# Patient Record
Sex: Male | Born: 1935 | Hispanic: No | Marital: Married | Smoking: Former smoker
Health system: Southern US, Community
[De-identification: ages and names within clinical notes are randomized; demographics above are authoritative.]

## PROBLEM LIST (undated history)

## (undated) DIAGNOSIS — H9190 Unspecified hearing loss, unspecified ear: Secondary | ICD-10-CM

## (undated) DIAGNOSIS — E119 Type 2 diabetes mellitus without complications: Secondary | ICD-10-CM

## (undated) DIAGNOSIS — K219 Gastro-esophageal reflux disease without esophagitis: Secondary | ICD-10-CM

## (undated) DIAGNOSIS — Z201 Contact with and (suspected) exposure to tuberculosis: Secondary | ICD-10-CM

## (undated) DIAGNOSIS — M1990 Unspecified osteoarthritis, unspecified site: Secondary | ICD-10-CM

## (undated) DIAGNOSIS — E7800 Pure hypercholesterolemia, unspecified: Secondary | ICD-10-CM

## (undated) DIAGNOSIS — I10 Essential (primary) hypertension: Secondary | ICD-10-CM

## (undated) HISTORY — DX: Unspecified hearing loss, unspecified ear: H91.90

## (undated) HISTORY — DX: Contact with and (suspected) exposure to tuberculosis: Z20.1

## (undated) HISTORY — DX: Gastro-esophageal reflux disease without esophagitis: K21.9

## (undated) HISTORY — DX: Pure hypercholesterolemia, unspecified: E78.00

## (undated) HISTORY — DX: Unspecified osteoarthritis, unspecified site: M19.90

## (undated) HISTORY — DX: Essential (primary) hypertension: I10

---

## 1986-08-27 HISTORY — PX: OTHER SURGICAL HISTORY: SHX169

## 2001-09-09 ENCOUNTER — Encounter: Payer: Self-pay | Admitting: Orthopedic Surgery

## 2001-09-09 ENCOUNTER — Encounter: Admission: RE | Admit: 2001-09-09 | Discharge: 2001-09-09 | Payer: Self-pay | Admitting: Orthopedic Surgery

## 2001-09-10 ENCOUNTER — Ambulatory Visit (HOSPITAL_BASED_OUTPATIENT_CLINIC_OR_DEPARTMENT_OTHER): Admission: RE | Admit: 2001-09-10 | Discharge: 2001-09-10 | Payer: Self-pay | Admitting: Orthopedic Surgery

## 2001-10-13 ENCOUNTER — Ambulatory Visit (HOSPITAL_BASED_OUTPATIENT_CLINIC_OR_DEPARTMENT_OTHER): Admission: RE | Admit: 2001-10-13 | Discharge: 2001-10-13 | Payer: Self-pay | Admitting: Orthopedic Surgery

## 2002-01-18 ENCOUNTER — Encounter: Admission: RE | Admit: 2002-01-18 | Discharge: 2002-01-18 | Payer: Self-pay | Admitting: Family Medicine

## 2002-01-18 ENCOUNTER — Encounter: Payer: Self-pay | Admitting: Family Medicine

## 2005-10-29 ENCOUNTER — Encounter: Admission: RE | Admit: 2005-10-29 | Discharge: 2005-10-29 | Payer: Self-pay | Admitting: Family Medicine

## 2006-10-02 ENCOUNTER — Ambulatory Visit: Payer: Self-pay | Admitting: Family Medicine

## 2007-11-25 ENCOUNTER — Ambulatory Visit: Payer: Self-pay | Admitting: Family Medicine

## 2009-01-02 ENCOUNTER — Ambulatory Visit: Payer: Self-pay | Admitting: Family Medicine

## 2010-03-28 ENCOUNTER — Ambulatory Visit: Payer: Self-pay | Admitting: Family Medicine

## 2010-11-20 ENCOUNTER — Ambulatory Visit
Admission: RE | Admit: 2010-11-20 | Discharge: 2010-11-20 | Payer: Self-pay | Source: Home / Self Care | Attending: Family Medicine | Admitting: Family Medicine

## 2011-03-14 NOTE — Op Note (Signed)
[place]  Patient:    PAULO, KEIMIG Visit Number: [id number] MRN: [id number]          Service Type: DSU Location: [facility] Attending Physician:  Ronne Binning Dictated by:   Nicki Reaper, M.D. Proc. Date: 09/10/01 Admit Date:  09/10/2001                             Operative Report  PREOPERATIVE DIAGNOSIS:  Carpal tunnel syndrome right hand.  POSTOPERATIVE DIAGNOSIS:  Carpal tunnel syndrome right hand.  OPERATION/PROCEDURE:  Decompression right median nerve.  SURGEON:  Nicki Reaper, M.D.  ASSISTANT:  ANESTHESIA:  Forearm based IV regional.  ANESTHESIOLOGIST:  Guadalupe Maple, M.D.  INDICATION FOR PROCEDURE:  The patient is a 77 year old male with a  history of carpal tunnel syndrome, EMG nerve conductions positive. He is desirous of having release performed.  DESCRIPTION OF PROCEDURE:  The patient is brought to the operating room where a forearm based IV regional anesthetic was carried out without difficulty in the supine position with right arm free.  He was prepped and draped using DuraPrep.  A straight incision was made longitudinally in the palm and carried down through subcutaneous tissue. Bleeders were electrocauterized.  Palmar fascia was split.  Superficial palmar arch identified.  The flexor tendon of the ring and little finger identified to the ulnar side of the median nerve. Carpal retinaculum was incised with sharp dissection. Right angle and Sewall retractor were placed between the skin and forearm fascia.  The fascia was released for approximately 3 cm proximal to the wrist crease under direct vision.  Canal was explored and no further lesions were identified. The wound was irrigated.  Skin was closed with interrupted 5-0 nylon sutures.  Sterile compressive dressing and splint was applied.  The patient tolerated the procedure well and was taken to the recovery room for observation in satisfactory condition.  He is  discharged home to return to [facility] in one week, on Vicodin and Keflex. Dictated by:   Nicki Reaper, M.D. Attending Physician:  Ronne Binning DD:  09/10/01 TD:  09/10/01 Job: [id number]

## 2011-10-31 ENCOUNTER — Ambulatory Visit (INDEPENDENT_AMBULATORY_CARE_PROVIDER_SITE_OTHER): Payer: Medicare Other | Admitting: Medical

## 2011-10-31 ENCOUNTER — Encounter: Payer: Self-pay | Admitting: Medical

## 2011-10-31 VITALS — BP 128/80 | HR 80 | Temp 97.5°F | Resp 16 | Wt 214.0 lb

## 2011-10-31 DIAGNOSIS — J40 Bronchitis, not specified as acute or chronic: Secondary | ICD-10-CM | POA: Insufficient documentation

## 2011-10-31 MED ORDER — AMOXICILLIN 875 MG PO TABS: 875.0000 mg | ORAL_TABLET | Freq: Two times a day (BID) | ORAL | Status: AC

## 2011-10-31 NOTE — Patient Instructions (Signed)
I am starting you on a 10 day course of Amoxicillin for chest cold today.   Rest, drink plenty of fluids, consider taking Mucinex DM OTC for cough and congestion.   If not improving or worse, call or return.

## 2011-10-31 NOTE — Progress Notes (Signed)
Subjective:   John Barr is a 78 y.o. male who presents for cold and cough.   Been having symptoms since Christmas.  Denies fever.  He notes cough, green mucous productive, but no SOB or dyspnea.  No head congestion, sore throat, ear pain.  Mostly in chest.  Using Vicks cold medication.  Denies sick contacts.  No other aggravating or relieving factors.  No other c/o.  He notes hx/o vocal cord injury, no recent voice changes.   The following portions of the patient's history were reviewed and updated as appropriate: allergies, current medications, past family history, past medical history, past social history, past surgical history and problem list.  Past Medical History  Diagnosis Date  . Hypertension   . High frequency hearing loss   . Exposure to TB     hx  . Hypercholesterolemia   . GERD (gastroesophageal reflux disease)   . Arthritis    ROS: Gen: no fever, chills, sweats, fatigue GI: no belly pain,  N/V/D Lungs: no SOB, wheezing Cardiac: no CP, palpitations  Objective:   Filed Vitals:   10/31/11 1026  BP: 128/80  Pulse: 80  Temp: 97.5 F (36.4 C)  Resp: 16    General appearance: Alert, WD/WN, no distress                             Skin: warm, no rash, no diaphoresis                           Head: no sinus tenderness                            Eyes: conjunctiva normal, corneas clear, PERRLA                            Ears: pearly TMs, external ear canals normal                          Nose: septum midline, turbinates swollen, with erythema and clear discharge             Mouth/throat: MMM, tongue normal, mild pharyngeal erythema                           Neck: supple, no adenopathy, no thyromegaly, nontender                          Heart: RRR, normal S1, S2, no murmurs                         Lungs: +bronchial breath sounds, +scattered rhonchi, no wheezes, no rales                Extremities: no edema, nontender     Assessment and Plan:   Encounter  Diagnosis  Name Primary?  . Bronchitis Yes   Prescription given today for Amoxicillin as below.  Discussed diagnosis and treatment of bronchitis.  Suggested symptomatic OTC remedies for cough and congestion.  Nasal saline spray for nasal congestion.  Tylenol or Ibuprofen OTC for fever and malaise.  Call/return in 2-3 days if symptoms are worse or not improving.  Advised that cough may linger even after the infection is  improved.

## 2012-08-20 ENCOUNTER — Encounter: Payer: Self-pay | Admitting: Internal Medicine

## 2013-07-04 ENCOUNTER — Encounter: Payer: Self-pay | Admitting: Family Medicine

## 2013-07-04 ENCOUNTER — Ambulatory Visit (INDEPENDENT_AMBULATORY_CARE_PROVIDER_SITE_OTHER): Payer: Medicare Other | Admitting: Family Medicine

## 2013-07-04 VITALS — BP 140/90 | HR 80 | Ht 68.5 in | Wt 204.0 lb

## 2013-07-04 DIAGNOSIS — E119 Type 2 diabetes mellitus without complications: Secondary | ICD-10-CM

## 2013-07-04 DIAGNOSIS — E1159 Type 2 diabetes mellitus with other circulatory complications: Secondary | ICD-10-CM

## 2013-07-04 DIAGNOSIS — E785 Hyperlipidemia, unspecified: Secondary | ICD-10-CM

## 2013-07-04 DIAGNOSIS — M129 Arthropathy, unspecified: Secondary | ICD-10-CM

## 2013-07-04 DIAGNOSIS — E1169 Type 2 diabetes mellitus with other specified complication: Secondary | ICD-10-CM

## 2013-07-04 DIAGNOSIS — M1990 Unspecified osteoarthritis, unspecified site: Secondary | ICD-10-CM

## 2013-07-04 DIAGNOSIS — Z79899 Other long term (current) drug therapy: Secondary | ICD-10-CM

## 2013-07-04 DIAGNOSIS — I10 Essential (primary) hypertension: Secondary | ICD-10-CM

## 2013-07-04 LAB — CBC WITH DIFFERENTIAL/PLATELET
Basophils Absolute: 0 10*3/uL (ref 0.0–0.1)
Basophils Relative: 1 % (ref 0–1)
Eosinophils Absolute: 0.2 10*3/uL (ref 0.0–0.7)
Eosinophils Relative: 4 % (ref 0–5)
HCT: 30.7 % — ABNORMAL LOW (ref 39.0–52.0)
Lymphocytes Relative: 25 % (ref 12–46)
MCH: 31.7 pg (ref 26.0–34.0)
MCHC: 34.9 g/dL (ref 30.0–36.0)
MCV: 90.8 fL (ref 78.0–100.0)
Monocytes Absolute: 0.6 10*3/uL (ref 0.1–1.0)
Platelets: 211 10*3/uL (ref 150–400)
RDW: 14.1 % (ref 11.5–15.5)

## 2013-07-04 NOTE — Progress Notes (Signed)
  Subjective:    Patient ID: John Barr, male    DOB: 1926-11-12, 79 y.o.   MRN: [id number]  HPI He is here for an interval evaluation. He gets his care mainly through the [place]. He does have underlying hypertension, hyperlipidemia, diabetes as well as arthritis. 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 He also states that the [place] told him that he had an enlarged heart. He has not complained of chest pain, shortness of breath, diaphoresis or PND the Social history was reviewed. He is married does not smoke or drink. He does check his blood sugars regularly. Exercise is minimal. He gets regular eye checks and has had immunization update. He states he did get his pneumonia shot. He is not sure whether he had shingles.   Review of Systems  Constitutional: Negative.   HENT: Negative.   Eyes: Negative.   Respiratory: Negative.   Cardiovascular: Negative.   Gastrointestinal: Negative.   Endocrine: Negative.   Genitourinary: Negative.        Objective:   Physical Exam alert and in no distress. Tympanic membranes not seen due to hearing aids. Throat is clear. Tonsils are normal. Neck is supple without adenopathy or thyromegaly. Cardiac exam shows a regular sinus rhythm without murmurs or gallops. Lungs are clear to auscultation. Donald exam shows no hepatosplenomegaly with normal bowel sounds.  Hemoglobin A1c is 7.1       Assessment & Plan:  Type II or unspecified type diabetes mellitus without mention of complication, not stated as uncontrolled - Plan: CBC with Differential, Comprehensive metabolic panel, Lipid panel, HgB A1c  Hypertension associated with diabetes - Plan: CBC with Differential, Comprehensive metabolic panel  Hyperlipidemia LDL goal <70 - Plan: Lipid panel  Arthritis  Encounter for long-term (current) use of other medications - Plan: CBC with Differential, Comprehensive metabolic panel, Lipid panel  I encouraged him to  continue with his present lifestyle. Overall I think he is doing quite well. Over 35 minutes spent discussing all these issues with him.

## 2013-07-04 NOTE — Patient Instructions (Addendum)
Use the Prilosec as needed for indigestion or acid reflux. [facility]

## 2013-07-05 LAB — COMPREHENSIVE METABOLIC PANEL
AST: 25 U/L (ref 0–37)
BUN: 25 mg/dL — ABNORMAL HIGH (ref 6–23)
Calcium: 9.8 mg/dL (ref 8.4–10.5)
Chloride: 101 mEq/L (ref 96–112)
Creat: 1.84 mg/dL — ABNORMAL HIGH (ref 0.50–1.35)
Total Bilirubin: 0.3 mg/dL (ref 0.3–1.2)

## 2013-07-05 LAB — LIPID PANEL
HDL: 37 mg/dL — ABNORMAL LOW (ref >39)
Total CHOL/HDL Ratio: 5 Ratio
VLDL: 65 mg/dL — ABNORMAL HIGH (ref 0–40)

## 2013-08-22 LAB — HM DIABETES EYE EXAM: HM Diabetic Eye Exam: NOT DETECTED

## 2013-08-24 ENCOUNTER — Encounter: Payer: Self-pay | Admitting: Internal Medicine

## 2013-09-06 ENCOUNTER — Encounter: Payer: Self-pay | Admitting: Family Medicine

## 2014-04-18 ENCOUNTER — Telehealth: Payer: Self-pay | Admitting: Family Medicine

## 2014-04-18 NOTE — Telephone Encounter (Signed)
lm

## 2014-04-26 ENCOUNTER — Encounter: Payer: Self-pay | Admitting: Family Medicine

## 2014-05-08 ENCOUNTER — Encounter: Payer: Medicare Other | Admitting: Family Medicine

## 2014-05-17 ENCOUNTER — Encounter: Payer: Self-pay | Admitting: Medical

## 2014-05-17 ENCOUNTER — Ambulatory Visit (INDEPENDENT_AMBULATORY_CARE_PROVIDER_SITE_OTHER): Payer: Medicare Other | Admitting: Medical

## 2014-05-17 VITALS — BP 170/100 | HR 92 | Temp 97.7°F | Resp 14 | Wt 204.0 lb

## 2014-05-17 DIAGNOSIS — R6889 Other general symptoms and signs: Secondary | ICD-10-CM

## 2014-05-17 DIAGNOSIS — M545 Low back pain, unspecified: Secondary | ICD-10-CM

## 2014-05-17 DIAGNOSIS — M25551 Pain in right hip: Secondary | ICD-10-CM

## 2014-05-17 DIAGNOSIS — M25559 Pain in unspecified hip: Secondary | ICD-10-CM

## 2014-05-17 DIAGNOSIS — I10 Essential (primary) hypertension: Secondary | ICD-10-CM

## 2014-05-17 DIAGNOSIS — E119 Type 2 diabetes mellitus without complications: Secondary | ICD-10-CM

## 2014-05-17 LAB — COMPREHENSIVE METABOLIC PANEL
ALBUMIN: 4.3 g/dL (ref 3.5–5.2)
ALK PHOS: 47 U/L (ref 39–117)
ALT: 22 U/L (ref 0–53)
AST: 23 U/L (ref 0–37)
BILIRUBIN TOTAL: 0.4 mg/dL (ref 0.2–1.2)
BUN: 24 mg/dL — ABNORMAL HIGH (ref 6–23)
CO2: 27 mEq/L (ref 19–32)
Calcium: 9.6 mg/dL (ref 8.4–10.5)
Chloride: 100 mEq/L (ref 96–112)
Creat: 1.79 mg/dL — ABNORMAL HIGH (ref 0.50–1.35)
GLUCOSE: 136 mg/dL — AB (ref 70–99)
POTASSIUM: 4.6 meq/L (ref 3.5–5.3)
SODIUM: 136 meq/L (ref 135–145)
TOTAL PROTEIN: 7.1 g/dL (ref 6.0–8.3)

## 2014-05-17 LAB — POCT URINALYSIS DIPSTICK
Bilirubin, UA: NEGATIVE
GLUCOSE UA: NEGATIVE
Ketones, UA: NEGATIVE
LEUKOCYTES UA: NEGATIVE
NITRITE UA: NEGATIVE
SPEC GRAV UA: 1.02
UROBILINOGEN UA: NEGATIVE
pH, UA: 5

## 2014-05-17 LAB — CBC WITH DIFFERENTIAL/PLATELET
BASOS PCT: 0 % (ref 0–1)
Basophils Absolute: 0 10*3/uL (ref 0.0–0.1)
EOS ABS: 0.1 10*3/uL (ref 0.0–0.7)
EOS PCT: 2 % (ref 0–5)
HCT: 33.4 % — ABNORMAL LOW (ref 39.0–52.0)
Hemoglobin: 11.1 g/dL — ABNORMAL LOW (ref 13.0–17.0)
Lymphocytes Relative: 22 % (ref 12–46)
Lymphs Abs: 1.5 10*3/uL (ref 0.7–4.0)
MCH: 30 pg (ref 26.0–34.0)
MCHC: 33.2 g/dL (ref 30.0–36.0)
MCV: 90.3 fL (ref 78.0–100.0)
Monocytes Absolute: 0.8 10*3/uL (ref 0.1–1.0)
Monocytes Relative: 11 % (ref 3–12)
NEUTROS PCT: 65 % (ref 43–77)
Neutro Abs: 4.5 10*3/uL (ref 1.7–7.7)
PLATELETS: 242 10*3/uL (ref 150–400)
RBC: 3.7 MIL/uL — ABNORMAL LOW (ref 4.22–5.81)
RDW: 13.9 % (ref 11.5–15.5)
WBC: 6.9 10*3/uL (ref 4.0–10.5)

## 2014-05-17 MED ORDER — SIMVASTATIN 40 MG PO TABS: 40.0000 mg | ORAL_TABLET | Freq: Every evening | ORAL | Status: DC

## 2014-05-17 MED ORDER — HYDROCHLOROTHIAZIDE 25 MG PO TABS: 25.0000 mg | ORAL_TABLET | Freq: Every day | ORAL | Status: DC

## 2014-05-17 MED ORDER — METFORMIN HCL 500 MG PO TABS: 500.0000 mg | ORAL_TABLET | Freq: Two times a day (BID) | ORAL | Status: DC

## 2014-05-17 MED ORDER — BENAZEPRIL HCL 40 MG PO TABS: 40.0000 mg | ORAL_TABLET | Freq: Every day | ORAL | Status: DC

## 2014-05-17 MED ORDER — OMEPRAZOLE 20 MG PO CPDR: 20.0000 mg | DELAYED_RELEASE_CAPSULE | Freq: Every day | ORAL | Status: DC

## 2014-05-17 NOTE — Progress Notes (Signed)
Subjective: Here with daughter John Barr.  His routine care is normally done at the [facility], however he has been to a variety of clinics in the last few months. For unknown reason he is out of most of his medications, although he says he has routine follow up with the [facility] for diabetes and blood pressure  He is here today for pain in right side x Friday.  Hasn't been able to get out of bed due to pain.  transferring out of chair or bed really hurts.  Uses cane some in general, but having to use cane more with this pain.  Walking aggravates it some but not as bad as transferring.  [place] out of bed onto his bottom this past weekend, but was having pain in the right side prior to this.  Denies fever, no blood in urine or stool.  Has some low back pain on the right. He notes hx/o hip pain in the past.   Last xray - unsure?  denies pain down the rest of the right leg.  No issues with the left leg.  Denies incontinence to stool or urine.  Denies belly pain, no NVD.   No respiratory symptoms, no arm symptoms, no numbness tingling or weakness.  No urinary odor or cloudiness to urine.   Doesn't recall prostate infection. No other aggravating or relieving factors.  No other complaint.  ROS as in subjective  Objective   Filed Vitals:   05/17/14 1346  BP: 170/100  Pulse: 92  Temp: 97.7 F (36.5 C)  Resp: 14    General appearance: alert, no distress, WD/WN, elderly AA male  Heart: RRR, normal S1, S2, no murmurs Lungs: CTA bilaterally, no wheezes, rhonchi, or rales Abdomen: +bs, soft, mild right upper quadrant tenderness, otherwise non tender, non distended, no masses, no hepatomegaly, no splenomegaly Back: tender right paraspinal upper and lower lumbar region Musculoskeletal: bilat hip external ROM reduced, otherwise ROM seems normal bilat LE, legs nontender, no swelling, possibly some mild atrophy on the right compared to the left, no other obvious deformity Extremities: 1+ bilat LE edema, no  cyanosis, no clubbing Pulses: 2+ symmetric, upper and lower extremities, normal cap refill Neurological:  Right leg 4/5 strength compared to left but says this is chromic, otherwise relatively normal sensation.    Assessment: Encounter Diagnoses  Name Primary?  . Right-sided low back pain without sciatica Yes  . Hip pain, right   . Difficulty transferring   . Essential hypertension, benign   . Type II or unspecified type diabetes mellitus without mention of complication, not stated as uncontrolled    Plan: Labs today, urine sent for culture, advise heat pad, Tylenol, gentle stretching for the right back/hip/flank pain which appears to be musculoskeletal strain and spasm.  I asked his daughter to help him get his medications figured out so he can continue to get his 54 day supply from the [place] he avoid numerous clinics to help improve continuity of care  All of his medications for local and we'll call back with lab results

## 2014-05-18 ENCOUNTER — Other Ambulatory Visit: Payer: Self-pay | Admitting: Medical

## 2014-05-18 LAB — HEMOGLOBIN A1C
Hgb A1c MFr Bld: 8.2 % — ABNORMAL HIGH (ref <5.7)
MEAN PLASMA GLUCOSE: 189 mg/dL — AB (ref <117)

## 2014-05-19 ENCOUNTER — Other Ambulatory Visit: Payer: Self-pay | Admitting: Medical

## 2014-05-19 LAB — URINE CULTURE

## 2014-05-19 MED ORDER — CIPROFLOXACIN HCL 500 MG PO TABS: 500.0000 mg | ORAL_TABLET | Freq: Two times a day (BID) | ORAL | Status: DC

## 2014-05-23 ENCOUNTER — Telehealth: Payer: Self-pay | Admitting: Family Medicine

## 2014-05-23 NOTE — Telephone Encounter (Signed)
Make f/u appt for a week or so to recheck urine and to discuss med changes, specifically regarding diabetes and kidneys.  We need to try and get last few OV notes and labs from the [place] too.   Please work on this.

## 2014-05-23 NOTE — Telephone Encounter (Signed)
Patient's daughter called back and said that her father wanted our office to handle his medications or any changes that need to take place. CLS Who do you need him to set an appointment up with?

## 2014-05-24 ENCOUNTER — Ambulatory Visit (INDEPENDENT_AMBULATORY_CARE_PROVIDER_SITE_OTHER): Payer: Medicare Other | Admitting: Family Medicine

## 2014-05-24 ENCOUNTER — Encounter: Payer: Self-pay | Admitting: Family Medicine

## 2014-05-24 VITALS — BP 124/80 | HR 80 | Wt 204.0 lb

## 2014-05-24 DIAGNOSIS — M549 Dorsalgia, unspecified: Secondary | ICD-10-CM

## 2014-05-24 DIAGNOSIS — N390 Urinary tract infection, site not specified: Secondary | ICD-10-CM

## 2014-05-24 DIAGNOSIS — M129 Arthropathy, unspecified: Secondary | ICD-10-CM

## 2014-05-24 DIAGNOSIS — E119 Type 2 diabetes mellitus without complications: Secondary | ICD-10-CM

## 2014-05-24 DIAGNOSIS — G8929 Other chronic pain: Secondary | ICD-10-CM

## 2014-05-24 DIAGNOSIS — M1990 Unspecified osteoarthritis, unspecified site: Secondary | ICD-10-CM

## 2014-05-24 MED ORDER — CIPROFLOXACIN HCL 500 MG PO TABS: 500.0000 mg | ORAL_TABLET | Freq: Two times a day (BID) | ORAL | Status: DC

## 2014-05-24 NOTE — Telephone Encounter (Signed)
Patient had an appointment today to follow up with Dr. Redmond School. CLS Patient will need to sign a medical release to get anything. He is already gone. I guess Dr. Redmond School is gone to be helping him with medications and care. CLS

## 2014-05-24 NOTE — Progress Notes (Signed)
   Subjective:    Patient ID: John Barr, male    DOB: Mar 25, 1927, 80 y.o.   MRN: [id number]  HPI He is here for recheck. He states that he is roughly 50 better with his back pain and urinary symptoms. He did face of Cipro and had no difficulty with this. He usually gets his care at the [facility] for his diabetes and general medical care. He would like to switch to my practice. His daughter he is here with him and is helping take care of him and his wife. He has had some difficulty with his ADD else and she is looking for help with this. He continues to complain of back pain and further history indicates he has had difficulty with this for several years.   Review of Systems     Objective:   Physical Exam Alert and in no distress otherwise not examined. Review of his previous urinalysis dipstick was equivocal.       Assessment & Plan:  Chronic back pain - Plan: Ambulatory referral to Physical Therapy  Type II or unspecified type diabetes mellitus without mention of complication, not stated as uncontrolled  Arthritis - Plan: Ambulatory referral to Physical Therapy  UTI (lower urinary tract infection) - Plan: ciprofloxacin (CIPRO) 500 MG tablet  he would like to switch to me for his general medical care which I will do. Discussed falls with him and strongly encourage getting involved in physical therapy for general back rehabilitation program. Encouraged him to get as busy as possible. He will continue to use his 4 pronged walker. He will also get another prescription for Cipro since he is roughly 50% better. Discussed changing his dietary habits to cut back on carbohydrates. His daughter states that he does eat cookies, ice cream and doughnuts. Recheck here in 4 months.

## 2014-05-29 ENCOUNTER — Ambulatory Visit: Payer: Medicare Other | Attending: Family Medicine

## 2014-05-29 DIAGNOSIS — R293 Abnormal posture: Secondary | ICD-10-CM | POA: Diagnosis not present

## 2014-05-29 DIAGNOSIS — M545 Low back pain, unspecified: Secondary | ICD-10-CM | POA: Diagnosis not present

## 2014-05-29 DIAGNOSIS — IMO0001 Reserved for inherently not codable concepts without codable children: Secondary | ICD-10-CM | POA: Diagnosis not present

## 2014-05-29 DIAGNOSIS — M2560 Stiffness of unspecified joint, not elsewhere classified: Secondary | ICD-10-CM | POA: Diagnosis not present

## 2014-05-29 DIAGNOSIS — R262 Difficulty in walking, not elsewhere classified: Secondary | ICD-10-CM | POA: Diagnosis not present

## 2014-05-31 ENCOUNTER — Ambulatory Visit: Payer: Medicare Other | Admitting: Physical Therapy

## 2014-05-31 DIAGNOSIS — IMO0001 Reserved for inherently not codable concepts without codable children: Secondary | ICD-10-CM | POA: Diagnosis not present

## 2014-06-05 ENCOUNTER — Telehealth: Payer: Self-pay | Admitting: Family Medicine

## 2014-06-05 ENCOUNTER — Ambulatory Visit: Payer: Medicare Other | Admitting: Physical Therapy

## 2014-06-05 DIAGNOSIS — IMO0001 Reserved for inherently not codable concepts without codable children: Secondary | ICD-10-CM | POA: Diagnosis not present

## 2014-06-05 NOTE — Telephone Encounter (Signed)
Daughter, Adonis Huguenin, called stating they received letter about Hydrochlorothiazide that pt  rcvd got filled recently. Letter says med was recalled due to a differnt pill found mixed in meds & thy should contact Dr. Hulen Skains pharmacy, [facility] [place], where pt pt picked up his med & pharmacist verified that their store did not dispense any of the recalled meds. Advised the daughter of this

## 2014-06-07 ENCOUNTER — Ambulatory Visit: Payer: Medicare Other | Admitting: Physical Therapy

## 2014-06-07 DIAGNOSIS — IMO0001 Reserved for inherently not codable concepts without codable children: Secondary | ICD-10-CM | POA: Diagnosis not present

## 2014-06-09 ENCOUNTER — Encounter: Payer: Medicare Other | Admitting: Physical Therapy

## 2014-06-12 ENCOUNTER — Encounter: Payer: Medicare Other | Admitting: Physical Therapy

## 2014-06-14 ENCOUNTER — Encounter: Payer: Medicare Other | Admitting: Physical Therapy

## 2014-06-15 ENCOUNTER — Other Ambulatory Visit: Payer: Self-pay | Admitting: Orthopedic Surgery

## 2014-06-15 ENCOUNTER — Ambulatory Visit
Admission: RE | Admit: 2014-06-15 | Discharge: 2014-06-15 | Disposition: A | Discharge status: Home / Self Care | Payer: Medicare Other | Source: Ambulatory Visit | Attending: Orthopedic Surgery | Admitting: Orthopedic Surgery

## 2014-06-15 DIAGNOSIS — L03032 Cellulitis of left toe: Secondary | ICD-10-CM

## 2014-06-16 ENCOUNTER — Encounter: Payer: Medicare Other | Admitting: Physical Therapy

## 2014-08-19 ENCOUNTER — Other Ambulatory Visit: Payer: Self-pay | Admitting: Medical

## 2014-08-24 ENCOUNTER — Encounter: Payer: Self-pay | Admitting: Internal Medicine

## 2014-08-24 LAB — HM DIABETES EYE EXAM

## 2014-08-29 ENCOUNTER — Other Ambulatory Visit: Payer: Self-pay

## 2014-08-29 ENCOUNTER — Telehealth: Payer: Self-pay | Admitting: Internal Medicine

## 2014-08-29 MED ORDER — BENAZEPRIL HCL 40 MG PO TABS: ORAL_TABLET | ORAL | Status: DC

## 2014-08-29 MED ORDER — SIMVASTATIN 40 MG PO TABS: 40.0000 mg | ORAL_TABLET | Freq: Every evening | ORAL | Status: DC

## 2014-08-29 MED ORDER — METFORMIN HCL 500 MG PO TABS: ORAL_TABLET | ORAL | Status: DC

## 2014-08-29 MED ORDER — HYDROCHLOROTHIAZIDE 25 MG PO TABS: 25.0000 mg | ORAL_TABLET | Freq: Every day | ORAL | Status: DC

## 2014-08-29 MED ORDER — OMEPRAZOLE 20 MG PO CPDR: 20.0000 mg | DELAYED_RELEASE_CAPSULE | Freq: Every day | ORAL | Status: DC

## 2014-08-29 NOTE — Telephone Encounter (Signed)
Request refill for metformin 500mg  #90,benazepril 40mg  #90,simvastatin 40mg  #90, hctz 25mg  #90, omeprazole 20mg  #90 all to [facility]

## 2014-08-29 NOTE — Telephone Encounter (Signed)
done

## 2014-09-27 ENCOUNTER — Encounter: Payer: Self-pay | Admitting: Family Medicine

## 2014-09-27 ENCOUNTER — Ambulatory Visit (INDEPENDENT_AMBULATORY_CARE_PROVIDER_SITE_OTHER): Payer: Medicare Other | Admitting: Family Medicine

## 2014-09-27 VITALS — Wt 204.0 lb

## 2014-09-27 DIAGNOSIS — E119 Type 2 diabetes mellitus without complications: Secondary | ICD-10-CM

## 2014-09-27 DIAGNOSIS — E785 Hyperlipidemia, unspecified: Secondary | ICD-10-CM

## 2014-09-27 DIAGNOSIS — E1169 Type 2 diabetes mellitus with other specified complication: Secondary | ICD-10-CM

## 2014-09-27 DIAGNOSIS — E1159 Type 2 diabetes mellitus with other circulatory complications: Secondary | ICD-10-CM

## 2014-09-27 DIAGNOSIS — I10 Essential (primary) hypertension: Secondary | ICD-10-CM

## 2014-09-27 DIAGNOSIS — I152 Hypertension secondary to endocrine disorders: Secondary | ICD-10-CM

## 2014-09-27 DIAGNOSIS — M1990 Unspecified osteoarthritis, unspecified site: Secondary | ICD-10-CM

## 2014-09-27 LAB — POCT GLYCOSYLATED HEMOGLOBIN (HGB A1C): Hemoglobin A1C: 6.7

## 2014-09-27 NOTE — Progress Notes (Signed)
Subjective:    John Barr is a 80 y.o. male who presents for follow-up of Type 2 diabetes mellitus.    Home blood sugar records: once a week every thursday  Current symptoms/problems  Feet swelling sometimes . Daily foot checks: Any foot concerns:yes/feet swelling Last eye exam:  08/24/14   Medication compliance: Questionable due to not knowing exactly what medications he is taking. He has 2 lists that or not compatible. Current diet: None  Current exercise: None Known diabetic complications: impotence Cardiovascular risk factors: advanced age (older than 68 for men, 27 for women), diabetes mellitus, dyslipidemia, hypertension, male gender and sedentary lifestyle   The following portions of the patient's history were reviewed and updated as appropriate: allergies, current medications, past family history, past medical history and problem list.  ROS as in subjective above    Objective:    Wt 204 lb (92.534 kg)  There were no vitals filed for this visit.  General appearence: alert, no distress, WD/WN Neck: supple, no lymphadenopathy, no thyromegaly, no masses Heart: RRR, normal S1, S2, no murmurs Lungs: CTA bilaterally, no wheezes, rhonchi, or rales Abdomen: +bs, soft, non tender, non distended, no masses, no hepatomegaly, no splenomegaly Pulses: 2+ symmetric, upper and lower extremities, normal cap refill Ext: no edema Foot exam:  Neuro: foot monofilament exam normal   Lab Review Lab Results  Component Value Date   HGBA1C 8.2* 05/17/2014   Lab Results  Component Value Date   CHOL 185 07/04/2013   HDL 37* 07/04/2013   LDLCALC 83 07/04/2013   TRIG 327* 07/04/2013   CHOLHDL 5.0 07/04/2013   No results found for: Derl Barrow   Chemistry      Component Value Date/Time   NA 136 05/17/2014 1436   K 4.6 05/17/2014 1436   CL 100 05/17/2014 1436   CO2 27 05/17/2014 1436   BUN 24* 05/17/2014 1436   CREATININE 1.79* 05/17/2014 1436      Component Value  Date/Time   CALCIUM 9.6 05/17/2014 1436   ALKPHOS 47 05/17/2014 1436   AST 23 05/17/2014 1436   ALT 22 05/17/2014 1436   BILITOT 0.4 05/17/2014 1436        Chemistry      Component Value Date/Time   NA 136 05/17/2014 1436   K 4.6 05/17/2014 1436   CL 100 05/17/2014 1436   CO2 27 05/17/2014 1436   BUN 24* 05/17/2014 1436   CREATININE 1.79* 05/17/2014 1436      Component Value Date/Time   CALCIUM 9.6 05/17/2014 1436   ALKPHOS 47 05/17/2014 1436   AST 23 05/17/2014 1436   ALT 22 05/17/2014 1436   BILITOT 0.4 05/17/2014 1436     Hemoglobin A1c 6.7      Assessment:   Diabetes mellitus without complication - Plan: POCT glycosylated hemoglobin (Hb A1C)  Arthritis  Hypertension associated with diabetes  Type 2 diabetes mellitus without complication  Hyperlipidemia associated with type 2 diabetes mellitus       Plan:    1.  Rx changes: none 2.  Education: Reviewed 'ABCs' of diabetes management (respective goals in parentheses):  A1C (<7), blood pressure (<130/80), and cholesterol (LDL <100). 3.  Compliance at present is estimated to be satisfactory. Efforts to improve compliance (if necessary) will be directed at increased exercise. 4. Follow up: 4 months   He does go to the [place] however gets most of his medications here. I did try to review his medication list with him and go over why he  is taking them and could not get a good response. I will have him bring in all of his medications so we can go over them in detail. He states that some of the medications were just given to him when he went to the [place] but he is not sure what exactly they were given to him for.

## 2014-09-27 NOTE — Patient Instructions (Addendum)
First use the Relafen(nabumetone)  for pain relief and then the hydrocodone Start using MiraLAX Come back in about a week and bring in all of your pills

## 2014-10-01 ENCOUNTER — Other Ambulatory Visit: Payer: Self-pay | Admitting: Medical

## 2014-10-06 ENCOUNTER — Ambulatory Visit (INDEPENDENT_AMBULATORY_CARE_PROVIDER_SITE_OTHER): Payer: Medicare Other | Admitting: Family Medicine

## 2014-10-06 DIAGNOSIS — Z79899 Other long term (current) drug therapy: Secondary | ICD-10-CM

## 2014-10-06 MED ORDER — BENAZEPRIL-HYDROCHLOROTHIAZIDE 20-12.5 MG PO TABS: ORAL_TABLET | ORAL | Status: DC

## 2014-10-06 NOTE — Patient Instructions (Addendum)
Continue with your exercises and for the pain try Tylenol. If that isn't enough then let me know. Cut back on your water at night by about a half and make sure you empty your bladder Take your omega-3, metformin, Omeprazole, simvastatin,cilostazol and benazepril/hct and one aspirin per day

## 2014-10-06 NOTE — Progress Notes (Signed)
   Subjective:    Patient ID: John Barr, male    DOB: May 16, 1927, 80 y.o.   MRN: [id number]  HPI he is here for medication reconciliation. He brought in all of his medications and it showed that he was actually taking 3 simvastatin pills per day. He does note that he has noted increased difficulty with aches and pains. He is not taking Sinequan or Relafen and is doing quite well. He does have some difficulty with sleep disturbance but this is mainly due to increased fluid intake. He states that the best relief of his back pain occurs when he exercises.  Review of Systems     Objective:   Physical Exam Alert and in no distress otherwise not examined       Assessment & Plan:  Encounter for long-term (current) use of medications  I combined his HCTZ with benazepril. I also wrote down exactly which medicines he is to be taking in his AVS. He is to no longer take the Sinequan or the Relafen. If he has further difficulties with pain he will try Tylenol.

## 2014-10-28 ENCOUNTER — Other Ambulatory Visit: Payer: Self-pay | Admitting: Medical

## 2014-11-27 DIAGNOSIS — I872 Venous insufficiency (chronic) (peripheral): Secondary | ICD-10-CM | POA: Diagnosis not present

## 2015-02-26 DIAGNOSIS — I872 Venous insufficiency (chronic) (peripheral): Secondary | ICD-10-CM | POA: Diagnosis not present

## 2015-05-28 DIAGNOSIS — I872 Venous insufficiency (chronic) (peripheral): Secondary | ICD-10-CM | POA: Diagnosis not present

## 2015-07-23 DIAGNOSIS — E1140 Type 2 diabetes mellitus with diabetic neuropathy, unspecified: Secondary | ICD-10-CM | POA: Diagnosis not present

## 2016-05-15 DIAGNOSIS — M5136 Other intervertebral disc degeneration, lumbar region: Secondary | ICD-10-CM | POA: Diagnosis not present

## 2016-05-15 DIAGNOSIS — M25552 Pain in left hip: Secondary | ICD-10-CM | POA: Diagnosis not present

## 2016-05-15 DIAGNOSIS — M9905 Segmental and somatic dysfunction of pelvic region: Secondary | ICD-10-CM | POA: Diagnosis not present

## 2016-05-15 DIAGNOSIS — M9904 Segmental and somatic dysfunction of sacral region: Secondary | ICD-10-CM | POA: Diagnosis not present

## 2016-05-15 DIAGNOSIS — M9903 Segmental and somatic dysfunction of lumbar region: Secondary | ICD-10-CM | POA: Diagnosis not present

## 2016-05-21 DIAGNOSIS — M9904 Segmental and somatic dysfunction of sacral region: Secondary | ICD-10-CM | POA: Diagnosis not present

## 2016-05-21 DIAGNOSIS — M9903 Segmental and somatic dysfunction of lumbar region: Secondary | ICD-10-CM | POA: Diagnosis not present

## 2016-05-21 DIAGNOSIS — M9905 Segmental and somatic dysfunction of pelvic region: Secondary | ICD-10-CM | POA: Diagnosis not present

## 2016-05-21 DIAGNOSIS — M5136 Other intervertebral disc degeneration, lumbar region: Secondary | ICD-10-CM | POA: Diagnosis not present

## 2016-05-21 DIAGNOSIS — M25552 Pain in left hip: Secondary | ICD-10-CM | POA: Diagnosis not present

## 2016-06-04 DIAGNOSIS — M25552 Pain in left hip: Secondary | ICD-10-CM | POA: Diagnosis not present

## 2016-06-04 DIAGNOSIS — M9904 Segmental and somatic dysfunction of sacral region: Secondary | ICD-10-CM | POA: Diagnosis not present

## 2016-06-04 DIAGNOSIS — M9905 Segmental and somatic dysfunction of pelvic region: Secondary | ICD-10-CM | POA: Diagnosis not present

## 2016-06-04 DIAGNOSIS — M5136 Other intervertebral disc degeneration, lumbar region: Secondary | ICD-10-CM | POA: Diagnosis not present

## 2016-06-04 DIAGNOSIS — M9903 Segmental and somatic dysfunction of lumbar region: Secondary | ICD-10-CM | POA: Diagnosis not present

## 2016-06-11 DIAGNOSIS — M9905 Segmental and somatic dysfunction of pelvic region: Secondary | ICD-10-CM | POA: Diagnosis not present

## 2016-06-11 DIAGNOSIS — M25552 Pain in left hip: Secondary | ICD-10-CM | POA: Diagnosis not present

## 2016-06-11 DIAGNOSIS — M9904 Segmental and somatic dysfunction of sacral region: Secondary | ICD-10-CM | POA: Diagnosis not present

## 2016-06-11 DIAGNOSIS — M5136 Other intervertebral disc degeneration, lumbar region: Secondary | ICD-10-CM | POA: Diagnosis not present

## 2016-06-11 DIAGNOSIS — M9903 Segmental and somatic dysfunction of lumbar region: Secondary | ICD-10-CM | POA: Diagnosis not present

## 2016-06-16 DIAGNOSIS — M9904 Segmental and somatic dysfunction of sacral region: Secondary | ICD-10-CM | POA: Diagnosis not present

## 2016-06-16 DIAGNOSIS — M9903 Segmental and somatic dysfunction of lumbar region: Secondary | ICD-10-CM | POA: Diagnosis not present

## 2016-06-16 DIAGNOSIS — M5136 Other intervertebral disc degeneration, lumbar region: Secondary | ICD-10-CM | POA: Diagnosis not present

## 2016-06-16 DIAGNOSIS — M25552 Pain in left hip: Secondary | ICD-10-CM | POA: Diagnosis not present

## 2016-06-16 DIAGNOSIS — M9905 Segmental and somatic dysfunction of pelvic region: Secondary | ICD-10-CM | POA: Diagnosis not present

## 2016-06-23 DIAGNOSIS — M5136 Other intervertebral disc degeneration, lumbar region: Secondary | ICD-10-CM | POA: Diagnosis not present

## 2016-06-23 DIAGNOSIS — M9903 Segmental and somatic dysfunction of lumbar region: Secondary | ICD-10-CM | POA: Diagnosis not present

## 2016-06-23 DIAGNOSIS — M9904 Segmental and somatic dysfunction of sacral region: Secondary | ICD-10-CM | POA: Diagnosis not present

## 2016-06-23 DIAGNOSIS — M25552 Pain in left hip: Secondary | ICD-10-CM | POA: Diagnosis not present

## 2016-06-23 DIAGNOSIS — M9905 Segmental and somatic dysfunction of pelvic region: Secondary | ICD-10-CM | POA: Diagnosis not present

## 2016-07-02 DIAGNOSIS — M9904 Segmental and somatic dysfunction of sacral region: Secondary | ICD-10-CM | POA: Diagnosis not present

## 2016-07-02 DIAGNOSIS — M9905 Segmental and somatic dysfunction of pelvic region: Secondary | ICD-10-CM | POA: Diagnosis not present

## 2016-07-02 DIAGNOSIS — M9903 Segmental and somatic dysfunction of lumbar region: Secondary | ICD-10-CM | POA: Diagnosis not present

## 2016-07-02 DIAGNOSIS — M25552 Pain in left hip: Secondary | ICD-10-CM | POA: Diagnosis not present

## 2016-07-02 DIAGNOSIS — M5136 Other intervertebral disc degeneration, lumbar region: Secondary | ICD-10-CM | POA: Diagnosis not present

## 2016-07-09 DIAGNOSIS — M5136 Other intervertebral disc degeneration, lumbar region: Secondary | ICD-10-CM | POA: Diagnosis not present

## 2016-07-09 DIAGNOSIS — M9903 Segmental and somatic dysfunction of lumbar region: Secondary | ICD-10-CM | POA: Diagnosis not present

## 2016-07-09 DIAGNOSIS — M25552 Pain in left hip: Secondary | ICD-10-CM | POA: Diagnosis not present

## 2016-07-09 DIAGNOSIS — M9905 Segmental and somatic dysfunction of pelvic region: Secondary | ICD-10-CM | POA: Diagnosis not present

## 2016-07-09 DIAGNOSIS — M9904 Segmental and somatic dysfunction of sacral region: Secondary | ICD-10-CM | POA: Diagnosis not present

## 2016-07-16 DIAGNOSIS — M25552 Pain in left hip: Secondary | ICD-10-CM | POA: Diagnosis not present

## 2016-07-16 DIAGNOSIS — M5136 Other intervertebral disc degeneration, lumbar region: Secondary | ICD-10-CM | POA: Diagnosis not present

## 2016-07-16 DIAGNOSIS — M9905 Segmental and somatic dysfunction of pelvic region: Secondary | ICD-10-CM | POA: Diagnosis not present

## 2016-07-16 DIAGNOSIS — M9903 Segmental and somatic dysfunction of lumbar region: Secondary | ICD-10-CM | POA: Diagnosis not present

## 2016-07-16 DIAGNOSIS — M9904 Segmental and somatic dysfunction of sacral region: Secondary | ICD-10-CM | POA: Diagnosis not present

## 2016-07-30 DIAGNOSIS — M5136 Other intervertebral disc degeneration, lumbar region: Secondary | ICD-10-CM | POA: Diagnosis not present

## 2016-07-30 DIAGNOSIS — M25552 Pain in left hip: Secondary | ICD-10-CM | POA: Diagnosis not present

## 2016-07-30 DIAGNOSIS — M9905 Segmental and somatic dysfunction of pelvic region: Secondary | ICD-10-CM | POA: Diagnosis not present

## 2016-07-30 DIAGNOSIS — M9904 Segmental and somatic dysfunction of sacral region: Secondary | ICD-10-CM | POA: Diagnosis not present

## 2016-07-30 DIAGNOSIS — M9903 Segmental and somatic dysfunction of lumbar region: Secondary | ICD-10-CM | POA: Diagnosis not present

## 2016-08-06 DIAGNOSIS — M5136 Other intervertebral disc degeneration, lumbar region: Secondary | ICD-10-CM | POA: Diagnosis not present

## 2016-08-06 DIAGNOSIS — M9905 Segmental and somatic dysfunction of pelvic region: Secondary | ICD-10-CM | POA: Diagnosis not present

## 2016-08-06 DIAGNOSIS — M9904 Segmental and somatic dysfunction of sacral region: Secondary | ICD-10-CM | POA: Diagnosis not present

## 2016-08-06 DIAGNOSIS — M9903 Segmental and somatic dysfunction of lumbar region: Secondary | ICD-10-CM | POA: Diagnosis not present

## 2016-08-06 DIAGNOSIS — M25552 Pain in left hip: Secondary | ICD-10-CM | POA: Diagnosis not present

## 2016-08-13 DIAGNOSIS — M9905 Segmental and somatic dysfunction of pelvic region: Secondary | ICD-10-CM | POA: Diagnosis not present

## 2016-08-13 DIAGNOSIS — M9904 Segmental and somatic dysfunction of sacral region: Secondary | ICD-10-CM | POA: Diagnosis not present

## 2016-08-13 DIAGNOSIS — M25552 Pain in left hip: Secondary | ICD-10-CM | POA: Diagnosis not present

## 2016-08-13 DIAGNOSIS — M9903 Segmental and somatic dysfunction of lumbar region: Secondary | ICD-10-CM | POA: Diagnosis not present

## 2016-08-13 DIAGNOSIS — M5136 Other intervertebral disc degeneration, lumbar region: Secondary | ICD-10-CM | POA: Diagnosis not present

## 2016-08-20 DIAGNOSIS — M9904 Segmental and somatic dysfunction of sacral region: Secondary | ICD-10-CM | POA: Diagnosis not present

## 2016-08-20 DIAGNOSIS — M9905 Segmental and somatic dysfunction of pelvic region: Secondary | ICD-10-CM | POA: Diagnosis not present

## 2016-08-20 DIAGNOSIS — M5136 Other intervertebral disc degeneration, lumbar region: Secondary | ICD-10-CM | POA: Diagnosis not present

## 2016-08-20 DIAGNOSIS — M25552 Pain in left hip: Secondary | ICD-10-CM | POA: Diagnosis not present

## 2016-08-20 DIAGNOSIS — M9903 Segmental and somatic dysfunction of lumbar region: Secondary | ICD-10-CM | POA: Diagnosis not present

## 2016-08-27 DIAGNOSIS — M9904 Segmental and somatic dysfunction of sacral region: Secondary | ICD-10-CM | POA: Diagnosis not present

## 2016-08-27 DIAGNOSIS — M9903 Segmental and somatic dysfunction of lumbar region: Secondary | ICD-10-CM | POA: Diagnosis not present

## 2016-08-27 DIAGNOSIS — M5136 Other intervertebral disc degeneration, lumbar region: Secondary | ICD-10-CM | POA: Diagnosis not present

## 2016-08-27 DIAGNOSIS — M25552 Pain in left hip: Secondary | ICD-10-CM | POA: Diagnosis not present

## 2016-08-27 DIAGNOSIS — M9905 Segmental and somatic dysfunction of pelvic region: Secondary | ICD-10-CM | POA: Diagnosis not present

## 2016-09-03 DIAGNOSIS — M9904 Segmental and somatic dysfunction of sacral region: Secondary | ICD-10-CM | POA: Diagnosis not present

## 2016-09-03 DIAGNOSIS — M9903 Segmental and somatic dysfunction of lumbar region: Secondary | ICD-10-CM | POA: Diagnosis not present

## 2016-09-03 DIAGNOSIS — M9905 Segmental and somatic dysfunction of pelvic region: Secondary | ICD-10-CM | POA: Diagnosis not present

## 2016-09-03 DIAGNOSIS — M25552 Pain in left hip: Secondary | ICD-10-CM | POA: Diagnosis not present

## 2016-09-03 DIAGNOSIS — M5136 Other intervertebral disc degeneration, lumbar region: Secondary | ICD-10-CM | POA: Diagnosis not present

## 2016-09-04 ENCOUNTER — Telehealth: Payer: Self-pay | Admitting: Internal Medicine

## 2016-09-04 NOTE — Telephone Encounter (Signed)
Pt is overdue for an appt . Pt will call back and schedule something

## 2016-09-10 DIAGNOSIS — M9904 Segmental and somatic dysfunction of sacral region: Secondary | ICD-10-CM | POA: Diagnosis not present

## 2016-09-10 DIAGNOSIS — M25552 Pain in left hip: Secondary | ICD-10-CM | POA: Diagnosis not present

## 2016-09-10 DIAGNOSIS — M9905 Segmental and somatic dysfunction of pelvic region: Secondary | ICD-10-CM | POA: Diagnosis not present

## 2016-09-10 DIAGNOSIS — M9903 Segmental and somatic dysfunction of lumbar region: Secondary | ICD-10-CM | POA: Diagnosis not present

## 2016-09-10 DIAGNOSIS — M5136 Other intervertebral disc degeneration, lumbar region: Secondary | ICD-10-CM | POA: Diagnosis not present

## 2016-09-17 DIAGNOSIS — M25552 Pain in left hip: Secondary | ICD-10-CM | POA: Diagnosis not present

## 2016-09-17 DIAGNOSIS — M5136 Other intervertebral disc degeneration, lumbar region: Secondary | ICD-10-CM | POA: Diagnosis not present

## 2016-09-17 DIAGNOSIS — M9903 Segmental and somatic dysfunction of lumbar region: Secondary | ICD-10-CM | POA: Diagnosis not present

## 2016-09-17 DIAGNOSIS — M9905 Segmental and somatic dysfunction of pelvic region: Secondary | ICD-10-CM | POA: Diagnosis not present

## 2016-09-17 DIAGNOSIS — M9904 Segmental and somatic dysfunction of sacral region: Secondary | ICD-10-CM | POA: Diagnosis not present

## 2016-09-24 DIAGNOSIS — M5136 Other intervertebral disc degeneration, lumbar region: Secondary | ICD-10-CM | POA: Diagnosis not present

## 2016-09-24 DIAGNOSIS — M9903 Segmental and somatic dysfunction of lumbar region: Secondary | ICD-10-CM | POA: Diagnosis not present

## 2016-09-24 DIAGNOSIS — M9904 Segmental and somatic dysfunction of sacral region: Secondary | ICD-10-CM | POA: Diagnosis not present

## 2016-09-24 DIAGNOSIS — M25552 Pain in left hip: Secondary | ICD-10-CM | POA: Diagnosis not present

## 2016-09-24 DIAGNOSIS — M9905 Segmental and somatic dysfunction of pelvic region: Secondary | ICD-10-CM | POA: Diagnosis not present

## 2016-10-23 ENCOUNTER — Ambulatory Visit: Payer: TRICARE For Life (TFL) | Admitting: Family Medicine

## 2016-10-31 ENCOUNTER — Encounter: Payer: Self-pay | Admitting: Family Medicine

## 2016-10-31 DIAGNOSIS — Z0000 Encounter for general adult medical examination without abnormal findings: Secondary | ICD-10-CM

## 2016-11-22 ENCOUNTER — Inpatient Hospital Stay (HOSPITAL_COMMUNITY)
Admission: EM | Admit: 2016-11-22 | Discharge: 2016-11-26 | DRG: 684 | Disposition: A | Discharge status: Skilled Nursing Facility | Payer: Medicare Other | Attending: Nephrology | Admitting: Nephrology

## 2016-11-22 ENCOUNTER — Encounter (HOSPITAL_COMMUNITY): Payer: Self-pay

## 2016-11-22 ENCOUNTER — Observation Stay (HOSPITAL_COMMUNITY): Payer: Medicare Other

## 2016-11-22 ENCOUNTER — Emergency Department (HOSPITAL_COMMUNITY): Payer: Medicare Other

## 2016-11-22 DIAGNOSIS — N289 Disorder of kidney and ureter, unspecified: Secondary | ICD-10-CM | POA: Diagnosis not present

## 2016-11-22 DIAGNOSIS — E119 Type 2 diabetes mellitus without complications: Secondary | ICD-10-CM | POA: Diagnosis not present

## 2016-11-22 DIAGNOSIS — G939 Disorder of brain, unspecified: Secondary | ICD-10-CM | POA: Diagnosis not present

## 2016-11-22 DIAGNOSIS — Z9114 Patient's other noncompliance with medication regimen: Secondary | ICD-10-CM

## 2016-11-22 DIAGNOSIS — N183 Chronic kidney disease, stage 3 (moderate): Secondary | ICD-10-CM | POA: Diagnosis present

## 2016-11-22 DIAGNOSIS — E1122 Type 2 diabetes mellitus with diabetic chronic kidney disease: Secondary | ICD-10-CM | POA: Diagnosis not present

## 2016-11-22 DIAGNOSIS — N179 Acute kidney failure, unspecified: Secondary | ICD-10-CM

## 2016-11-22 DIAGNOSIS — I129 Hypertensive chronic kidney disease with stage 1 through stage 4 chronic kidney disease, or unspecified chronic kidney disease: Secondary | ICD-10-CM | POA: Diagnosis not present

## 2016-11-22 DIAGNOSIS — E1169 Type 2 diabetes mellitus with other specified complication: Secondary | ICD-10-CM | POA: Diagnosis present

## 2016-11-22 DIAGNOSIS — N185 Chronic kidney disease, stage 5: Secondary | ICD-10-CM | POA: Diagnosis present

## 2016-11-22 DIAGNOSIS — H9190 Unspecified hearing loss, unspecified ear: Secondary | ICD-10-CM | POA: Diagnosis present

## 2016-11-22 DIAGNOSIS — D509 Iron deficiency anemia, unspecified: Secondary | ICD-10-CM | POA: Diagnosis not present

## 2016-11-22 DIAGNOSIS — Z87891 Personal history of nicotine dependence: Secondary | ICD-10-CM | POA: Diagnosis not present

## 2016-11-22 DIAGNOSIS — R296 Repeated falls: Secondary | ICD-10-CM

## 2016-11-22 DIAGNOSIS — R938 Abnormal findings on diagnostic imaging of other specified body structures: Secondary | ICD-10-CM | POA: Diagnosis present

## 2016-11-22 DIAGNOSIS — R3129 Other microscopic hematuria: Secondary | ICD-10-CM | POA: Diagnosis not present

## 2016-11-22 DIAGNOSIS — R531 Weakness: Secondary | ICD-10-CM | POA: Diagnosis not present

## 2016-11-22 DIAGNOSIS — R2689 Other abnormalities of gait and mobility: Secondary | ICD-10-CM | POA: Diagnosis not present

## 2016-11-22 DIAGNOSIS — I10 Essential (primary) hypertension: Secondary | ICD-10-CM

## 2016-11-22 DIAGNOSIS — C719 Malignant neoplasm of brain, unspecified: Secondary | ICD-10-CM | POA: Diagnosis not present

## 2016-11-22 DIAGNOSIS — K219 Gastro-esophageal reflux disease without esophagitis: Secondary | ICD-10-CM | POA: Diagnosis not present

## 2016-11-22 DIAGNOSIS — I152 Hypertension secondary to endocrine disorders: Secondary | ICD-10-CM | POA: Diagnosis present

## 2016-11-22 DIAGNOSIS — W19XXXA Unspecified fall, initial encounter: Secondary | ICD-10-CM

## 2016-11-22 DIAGNOSIS — E785 Hyperlipidemia, unspecified: Secondary | ICD-10-CM | POA: Diagnosis present

## 2016-11-22 DIAGNOSIS — E1159 Type 2 diabetes mellitus with other circulatory complications: Secondary | ICD-10-CM | POA: Diagnosis present

## 2016-11-22 DIAGNOSIS — G9389 Other specified disorders of brain: Secondary | ICD-10-CM

## 2016-11-22 HISTORY — DX: Type 2 diabetes mellitus without complications: E11.9

## 2016-11-22 LAB — BASIC METABOLIC PANEL
ANION GAP: 10 (ref 5–15)
BUN: 58 mg/dL — ABNORMAL HIGH (ref 6–20)
CHLORIDE: 108 mmol/L (ref 101–111)
CO2: 22 mmol/L (ref 22–32)
Calcium: 9.3 mg/dL (ref 8.9–10.3)
Creatinine, Ser: 3.76 mg/dL — ABNORMAL HIGH (ref 0.61–1.24)
GFR calc non Af Amer: 13 mL/min — ABNORMAL LOW (ref >60)
GFR, EST AFRICAN AMERICAN: 15 mL/min — AB (ref >60)
Glucose, Bld: 145 mg/dL — ABNORMAL HIGH (ref 65–99)
POTASSIUM: 4.9 mmol/L (ref 3.5–5.1)
Sodium: 140 mmol/L (ref 135–145)

## 2016-11-22 LAB — CBC
HEMATOCRIT: 28.5 % — AB (ref 39.0–52.0)
HEMOGLOBIN: 9.2 g/dL — AB (ref 13.0–17.0)
MCH: 28.8 pg (ref 26.0–34.0)
MCHC: 32.3 g/dL (ref 30.0–36.0)
MCV: 89.3 fL (ref 78.0–100.0)
Platelets: 243 10*3/uL (ref 150–400)
RBC: 3.19 MIL/uL — AB (ref 4.22–5.81)
RDW: 13.9 % (ref 11.5–15.5)
WBC: 9.5 10*3/uL (ref 4.0–10.5)

## 2016-11-22 LAB — URINALYSIS, ROUTINE W REFLEX MICROSCOPIC
Bacteria, UA: NONE SEEN
Bilirubin Urine: NEGATIVE
Glucose, UA: NEGATIVE mg/dL
KETONES UR: NEGATIVE mg/dL
LEUKOCYTES UA: NEGATIVE
Nitrite: NEGATIVE
PROTEIN: 100 mg/dL — AB
SQUAMOUS EPITHELIAL / LPF: NONE SEEN
Specific Gravity, Urine: 1.012 (ref 1.005–1.030)
pH: 6 (ref 5.0–8.0)

## 2016-11-22 LAB — CREATININE, URINE, RANDOM: CREATININE, URINE: 85.94 mg/dL

## 2016-11-22 LAB — GLUCOSE, CAPILLARY
Glucose-Capillary: 99 mg/dL (ref 65–99)
Glucose-Capillary: 133 mg/dL — ABNORMAL HIGH (ref 65–99)

## 2016-11-22 LAB — INFLUENZA PANEL BY PCR (TYPE A & B)
INFLBPCR: NEGATIVE
Influenza A By PCR: NEGATIVE

## 2016-11-22 LAB — SODIUM, URINE, RANDOM: SODIUM UR: 88 mmol/L

## 2016-11-22 MED ORDER — HEPARIN SODIUM (PORCINE) 5000 UNIT/ML IJ SOLN
5000.0000 [IU] | Freq: Two times a day (BID) | INTRAMUSCULAR | Status: DC
  Administered 2016-11-22 – 2016-11-25 (×6): 5000 [IU] via SUBCUTANEOUS
  Filled 2016-11-22 (×6): qty 1

## 2016-11-22 MED ORDER — ACETAMINOPHEN 500 MG PO TABS
1000.0000 mg | ORAL_TABLET | Freq: Four times a day (QID) | ORAL | Status: DC | PRN
Start: 2016-11-22 — End: 2016-11-26

## 2016-11-22 MED ORDER — SODIUM CHLORIDE 0.9% FLUSH
3.0000 mL | Freq: Two times a day (BID) | INTRAVENOUS | Status: DC
  Administered 2016-11-22 – 2016-11-26 (×6): 3 mL via INTRAVENOUS

## 2016-11-22 MED ORDER — SODIUM CHLORIDE 0.9 % IV BOLUS (SEPSIS)
1000.0000 mL | Freq: Once | INTRAVENOUS | Status: AC
  Administered 2016-11-22: 1000 mL via INTRAVENOUS

## 2016-11-22 MED ORDER — SODIUM CHLORIDE 0.9 % IV SOLN
INTRAVENOUS | Status: DC
  Administered 2016-11-22 – 2016-11-25 (×3): via INTRAVENOUS

## 2016-11-22 MED ORDER — INSULIN ASPART 100 UNIT/ML ~~LOC~~ SOLN
0.0000 [IU] | Freq: Three times a day (TID) | SUBCUTANEOUS | Status: DC
  Administered 2016-11-23 – 2016-11-26 (×7): 1 [IU] via SUBCUTANEOUS

## 2016-11-22 NOTE — ED Notes (Signed)
Patient transported to Ultrasound 

## 2016-11-22 NOTE — ED Notes (Signed)
ED Provider at bedside. 

## 2016-11-22 NOTE — H&P (Signed)
[facility] History and Physical  Andra Motton [id number] DOB: June 18, 1927 DOA: 11/22/2016  Referring physician: ED PCP: Wyatt Haste, MD   Chief Complaint: frequent falls  HPI: John Barr is a 83 y.o. male [employer] vet, who lives at home, with significant past medical history of hypertension, diabetes and suspect chronic kidney disease per labs, has presents to the ED with worsening weakness ongoing for the last week.  He has been staying in bed more, had 2 falls on Thursday w/o trauma and 1 last night. His daughter was unable to get him out of bed this morning, so she called EMS and was brought into the ED for further evaluation.  Of note, family thinks he may have dementia, but no formal dx.  He has eaten less last week, but pt states he has not had any difficulty urinating.   Denies any acute c/o currently, denies chest pain/sob/d/c/n/v/f/c, no sick contacts.   Family, dgt and sister, in room w/ pt currently.  Review of Systems:  Per hpi, o/w all systems reviewed and negative.  However, caveat, suspect limited due to possible dementia.  Past Medical History:  Diagnosis Date  . Arthritis   . Diabetes mellitus without complication ([place])   . Exposure to TB    hx  . GERD (gastroesophageal reflux disease)   . High frequency hearing loss   . Hypercholesterolemia   . Hypertension    Past Surgical History:  Procedure Laterality Date  . vocal cord repair  11/87   Social History:  reports that he quit smoking about 45 years ago. His smoking use included Cigarettes. He has never used smokeless tobacco. He reports that he does not drink alcohol or use drugs.  No Known Allergies  History reviewed. No pertinent family history. reviewed.  Prior to Admission medications   Medication Sig Start Date End Date Taking? Authorizing Provider  acetaminophen (TYLENOL) 500 MG tablet Take 1,000 mg by mouth every 6 (six) hours as needed for moderate pain or headache.   Yes Historical  Provider, MD  benazepril-hydrochlorthiazide (LOTENSIN HCT) 20-12.5 MG per tablet Take 2 pills per day Patient not taking: Reported on 11/22/2016 10/06/14   Denita Lung, MD  hydrochlorothiazide (HYDRODIURIL) 25 MG tablet TAKE 1 TABLET (25 MG TOTAL) BY MOUTH DAILY. Patient not taking: Reported on 11/22/2016 10/30/14   Denita Lung, MD  metFORMIN (GLUCOPHAGE) 500 MG tablet TAKE 1 TABLET (500 MG TOTAL) BY MOUTH 2 (TWO) TIMES DAILY WITH A MEAL. Patient not taking: Reported on 11/22/2016 08/29/14   Denita Lung, MD  omeprazole (PRILOSEC) 20 MG capsule Take 1 capsule (20 mg total) by mouth daily. Patient not taking: Reported on 11/22/2016 08/29/14   Denita Lung, MD  omeprazole (PRILOSEC) 20 MG capsule TAKE 1 CAPSULE (20 MG TOTAL) BY MOUTH DAILY. Patient not taking: Reported on 11/22/2016 10/30/14   Denita Lung, MD  simvastatin (ZOCOR) 40 MG tablet Take 1 tablet (40 mg total) by mouth every evening. Patient not taking: Reported on 11/22/2016 08/29/14   Denita Lung, MD   Physical Exam: Vitals:   11/22/16 1200 11/22/16 1212  BP:  177/99  Pulse:  88  Resp:  16  Temp:  98.4 F (36.9 C)  TempSrc:  Oral  SpO2: 99% 99%  Weight:  82.6 kg (182 lb)  Height:  6' (1.829 m)    Wt Readings from Last 3 Encounters:  11/22/16 82.6 kg (182 lb)  09/27/14 92.5 kg (204 lb)  05/24/14 92.5 kg (204 lb)  General:  Appears calm and comfortable, pleasant, NAD, AAOx 1/4 (not to month, year or president, oriented to "hospital" Eyes: PERRL, normal lids, irises & conjunctiva ENT: grossly normal hearing, lips & tongue, mmm Neck: no LAD, no jvp noted Cardiovascular: RRR, no m/r/g. No LE edema. Telemetry: SR, no arrhythmias  Respiratory: CTA bilaterally, no w/r/r. Normal respiratory effort. Abdomen: soft, ntnd Skin: no rash or induration seen on limited exam Musculoskeletal: grossly normal tone BUE/BLE Psychiatric: grossly normal mood and affect, speech fluent and appropriate Neurologic: grossly non-focal.            Labs on Admission:  Basic Metabolic Panel:  Recent Labs Lab 11/22/16 1212  NA 140  K 4.9  CL 108  CO2 22  GLUCOSE 145*  BUN 58*  CREATININE 3.76*  CALCIUM 9.3   Liver Function Tests: No results for input(s): AST, ALT, ALKPHOS, BILITOT, PROT, ALBUMIN in the last 168 hours. No results for input(s): LIPASE, AMYLASE in the last 168 hours. No results for input(s): AMMONIA in the last 168 hours. CBC:  Recent Labs Lab 11/22/16 1212  WBC 9.5  HGB 9.2*  HCT 28.5*  MCV 89.3  PLT 243   Cardiac Enzymes: No results for input(s): CKTOTAL, CKMB, CKMBINDEX, TROPONINI in the last 168 hours.  BNP (last 3 results) No results for input(s): BNP in the last 8760 hours.  ProBNP (last 3 results) No results for input(s): PROBNP in the last 8760 hours.  CBG: No results for input(s): GLUCAP in the last 168 hours.  Radiological Exams on Admission: Dg Chest 2 View  Result Date: 11/22/2016 CLINICAL DATA:  Generalized weakness for 1 month EXAM: CHEST  2 VIEW COMPARISON:  None. FINDINGS: Mild cardiomegaly. Lungs are clear. No effusions or edema. No acute bony abnormality. IMPRESSION: Cardiomegaly.  No active disease. Electronically Signed   By: Rolm Baptise M.D.   On: 11/22/2016 13:09    EKG: Independently reviewed.   EKG Interpretation  Date/Time:  Saturday November 22 2016 12:01:27 EST Ventricular Rate:  92 PR Interval:    QRS Duration: 99 QT Interval:  367 QTC Calculation: [id number] R Axis:   -71 Text Interpretation:  Sinus rhythm Ventricular premature complex Probable left atrial enlargement Abnormal R-wave progression, late transition Inferior infarct, old No old tracing to compare Confirmed by FLOYD MD, DANIEL [phone]) on 11/22/2016 12:44:59 PM        Assessment/Plan Principal Problem:   AKI (acute kidney injury) ([place]) Active Problems:   Diabetes ([place])   Hypertension associated with diabetes ([place])   Hyperlipidemia associated with type 2 diabetes mellitus  ([place])   1. Aki, suspect due to decrease po intake., w/ possible ckd as well. - obs tele - last cr 1.79 (7/15 in records) - chk bladder scan to r/o obstruction, place foley if need - chk FENA - chk renal US - gentle hydration - May need renal cs if does not improve w/ gentle hydration  2. Increasing falls, etiology unclear. - ?worsening dementia vs infectious etiology vs decrease po intake/dehydration - afebrile, no wbc - ua pending to r/o infectin - cxr neg for acute disease - chk flu swab, droplet precautions for now   3. Dm, - pt currently not taking any meds at home, all on hold - accuchks, riss  4. Htn, - pt currently not taking any of his bp meds (including benazepril and hctz.), all on hold while inpt - bp nml  5. hld - pt currently not taking his statin either, - hold for now  6. Suspect dementia - unless he has an infection, suspect dementia, aao 1/4 on exam  7. Anemia, may be 2nd to chronic disease -follow w/ am labs  No c/s  Code Status: full DVT Prophylaxis: hep [place] bid Family Communication: pt,  dgt and sister at bedside Disposition Plan: obs tele  Time spent: 59mins  Maren Reamer MD., MBA/MHA [facility] Pager [phone]

## 2016-11-22 NOTE — ED Triage Notes (Signed)
To room via EMS.  Onset several days increased weakness.  Pt had to be helped to floor x 2 yesterday.  EMS called @ 7pm, pt did not get transported to ED.  Per EMS, daughter states onset 1 month pt has had repetitive questions and retelling of stores.   Pt has chronic kne, back, ankle, and hip pain from previous injuries while in [employer].  Pt has hoarse voice from previous vocal cord surgery. Alert to self, month, situation.

## 2016-11-22 NOTE — ED Provider Notes (Signed)
[facility] DEPT Provider Note   CSN: [id number] Arrival date & time: 11/22/16  1147     History   Chief Complaint Chief Complaint  Patient presents with  . Weakness    HPI John Barr is a 83 y.o. male.  83 yo M with a cc of generalized weakness.  Having trouble ambulating.  Patient denies cp, sob, abdominal pain, vomiting diarrhea.     The history is provided by the patient.  Illness  This is a new problem. The current episode started less than 1 hour ago. The problem occurs constantly. The problem has not changed since onset.Pertinent negatives include no chest pain, no abdominal pain, no headaches and no shortness of breath. Nothing aggravates the symptoms. Nothing relieves the symptoms. He has tried nothing for the symptoms. The treatment provided no relief.    Past Medical History:  Diagnosis Date  . Arthritis   . Diabetes mellitus without complication ([place])   . Exposure to TB    hx  . GERD (gastroesophageal reflux disease)   . High frequency hearing loss   . Hypercholesterolemia   . Hypertension     Patient Active Problem List   Diagnosis Date Noted  . AKI (acute kidney injury) ([place]) 11/22/2016  . Frequent falls 11/22/2016  . Hyperlipidemia associated with type 2 diabetes mellitus ([place]) 09/27/2014  . Diabetes ([place]) 07/04/2013  . Hypertension associated with diabetes ([place]) 07/04/2013  . Arthritis 07/04/2013    Past Surgical History:  Procedure Laterality Date  . vocal cord repair  11/87       Home Medications    Prior to Admission medications   Medication Sig Start Date End Date Taking? Authorizing Provider  acetaminophen (TYLENOL) 500 MG tablet Take 1,000 mg by mouth every 6 (six) hours as needed for moderate pain or headache.   Yes Historical Provider, MD  benazepril-hydrochlorthiazide (LOTENSIN HCT) 20-12.5 MG per tablet Take 2 pills per day Patient not taking: Reported on 11/22/2016 10/06/14   Denita Lung, MD  hydrochlorothiazide  (HYDRODIURIL) 25 MG tablet TAKE 1 TABLET (25 MG TOTAL) BY MOUTH DAILY. Patient not taking: Reported on 11/22/2016 10/30/14   Denita Lung, MD  metFORMIN (GLUCOPHAGE) 500 MG tablet TAKE 1 TABLET (500 MG TOTAL) BY MOUTH 2 (TWO) TIMES DAILY WITH A MEAL. Patient not taking: Reported on 11/22/2016 08/29/14   Denita Lung, MD  omeprazole (PRILOSEC) 20 MG capsule Take 1 capsule (20 mg total) by mouth daily. Patient not taking: Reported on 11/22/2016 08/29/14   Denita Lung, MD  omeprazole (PRILOSEC) 20 MG capsule TAKE 1 CAPSULE (20 MG TOTAL) BY MOUTH DAILY. Patient not taking: Reported on 11/22/2016 10/30/14   Denita Lung, MD  simvastatin (ZOCOR) 40 MG tablet Take 1 tablet (40 mg total) by mouth every evening. Patient not taking: Reported on 11/22/2016 08/29/14   Denita Lung, MD    Family History History reviewed. No pertinent family history.  Social History Social History  Substance Use Topics  . Smoking status: Former Smoker    Types: Cigarettes    Quit date: 10/31/1971  . Smokeless tobacco: Never Used  . Alcohol use No     Allergies   Patient has no known allergies.   Review of Systems Review of Systems  Constitutional: Negative for chills and fever.  HENT: Negative for congestion and facial swelling.   Eyes: Negative for discharge and visual disturbance.  Respiratory: Negative for shortness of breath.   Cardiovascular: Negative for chest pain and palpitations.  Gastrointestinal: Negative for abdominal pain, diarrhea and vomiting.  Musculoskeletal: Negative for arthralgias and myalgias.  Skin: Negative for color change and rash.  Neurological: Positive for weakness (generalized). Negative for tremors, syncope and headaches.  Psychiatric/Behavioral: Negative for confusion and dysphoric mood.     Physical Exam Updated Vital Signs BP 177/99   Pulse 88   Temp 98.4 F (36.9 C) (Oral)   Resp 16   Ht 6' (1.829 m)   Wt 182 lb (82.6 kg)   SpO2 99%   BMI 24.68 kg/m    Physical Exam  Constitutional: He is oriented to person, place, and time. He appears well-developed and well-nourished.  HENT:  Head: Normocephalic and atraumatic.  Eyes: EOM are normal. Pupils are equal, round, and reactive to light.  Neck: Normal range of motion. Neck supple. No JVD present.  Cardiovascular: Normal rate and regular rhythm.  Exam reveals no gallop and no friction rub.   No murmur heard. Pulmonary/Chest: No respiratory distress. He has no wheezes.  Abdominal: He exhibits no distension and no mass. There is no tenderness. There is no rebound and no guarding.  Musculoskeletal: Normal range of motion.  Ambulates with significant assistance.  Walks with a  Limp.  No focal tenderness on exam of either leg.   Neurological: He is alert and oriented to person, place, and time. He has normal strength. No cranial nerve deficit or sensory deficit. He displays a negative Romberg sign. GCS eye subscore is 4. GCS verbal subscore is 5. GCS motor subscore is 6. He displays no Babinski's sign on the right side. He displays no Babinski's sign on the left side.  Reflex Scores:      Tricep reflexes are 2+ on the right side and 2+ on the left side.      Bicep reflexes are 2+ on the right side and 2+ on the left side.      Brachioradialis reflexes are 2+ on the right side and 2+ on the left side.      Patellar reflexes are 2+ on the right side and 2+ on the left side.      Achilles reflexes are 2+ on the right side and 2+ on the left side. Skin: No rash noted. No pallor.  Psychiatric: He has a normal mood and affect. His behavior is normal.  Nursing note and vitals reviewed.    ED Treatments / Results  Labs (all labs ordered are listed, but only abnormal results are displayed) Labs Reviewed  BASIC METABOLIC PANEL - Abnormal; Notable for the following:       Result Value   Glucose, Bld 145 (*)    BUN 58 (*)    Creatinine, Ser 3.76 (*)    GFR calc non Af Amer 13 (*)    GFR calc Af  Amer 15 (*)    All other components within normal limits  CBC - Abnormal; Notable for the following:    RBC 3.19 (*)    Hemoglobin 9.2 (*)    HCT 28.5 (*)    All other components within normal limits  URINALYSIS, ROUTINE W REFLEX MICROSCOPIC  CBC  CREATININE, SERUM  INFLUENZA PANEL BY PCR (TYPE A & B)  CREATININE, URINE, RANDOM  SODIUM, URINE, RANDOM  CBG MONITORING, ED    EKG  EKG Interpretation  Date/Time:  Saturday November 22 2016 12:01:27 EST Ventricular Rate:  92 PR Interval:    QRS Duration: 99 QT Interval:  367 QTC Calculation: 454 R Axis:   -58 Text  Interpretation:  Sinus rhythm Ventricular premature complex Probable left atrial enlargement Abnormal R-wave progression, late transition Inferior infarct, old No old tracing to compare Confirmed by Talayah Picardi MD, DANIEL [phone]) on 11/22/2016 12:44:59 PM       Radiology Dg Chest 2 View  Result Date: 11/22/2016 CLINICAL DATA:  Generalized weakness for 1 month EXAM: CHEST  2 VIEW COMPARISON:  None. FINDINGS: Mild cardiomegaly. Lungs are clear. No effusions or edema. No acute bony abnormality. IMPRESSION: Cardiomegaly.  No active disease. Electronically Signed   By: Rolm Baptise M.D.   On: 11/22/2016 13:09    Procedures Procedures (including critical care time)  Medications Ordered in ED Medications  acetaminophen (TYLENOL) tablet 1,000 mg (not administered)  heparin injection 5,000 Units (not administered)  sodium chloride flush (NS) 0.9 % injection 3 mL (not administered)  0.9 %  sodium chloride infusion ( Intravenous New Bag/Given 11/22/16 1445)  insulin aspart (novoLOG) injection 0-9 Units (not administered)  sodium chloride 0.9 % bolus 1,000 mL (1,000 mLs Intravenous New Bag/Given 11/22/16 1313)     Initial Impression / Assessment and Plan / ED Course  I have reviewed the triage vital signs and the nursing notes.  Pertinent labs & imaging results that were available during my care of the patient were reviewed by me  and considered in my medical decision making (see chart for details).     83 yo M with generalized weakness.  Per family Patient has been having significant difficulty getting around. Is had 3 falls in the past 48 hours. He landed on his chronic or injury. He does have significant AK I I'm unable to ambulate without significant assistance. Will admit.  The patients results and plan were reviewed and discussed.   Any x-rays performed were independently reviewed by myself.   Differential diagnosis were considered with the presenting HPI.  Medications  acetaminophen (TYLENOL) tablet 1,000 mg (not administered)  heparin injection 5,000 Units (not administered)  sodium chloride flush (NS) 0.9 % injection 3 mL (not administered)  0.9 %  sodium chloride infusion ( Intravenous New Bag/Given 11/22/16 1445)  insulin aspart (novoLOG) injection 0-9 Units (not administered)  sodium chloride 0.9 % bolus 1,000 mL (1,000 mLs Intravenous New Bag/Given 11/22/16 1313)    Vitals:   11/22/16 1200 11/22/16 1212  BP:  177/99  Pulse:  88  Resp:  16  Temp:  98.4 F (36.9 C)  TempSrc:  Oral  SpO2: 99% 99%  Weight:  182 lb (82.6 kg)  Height:  6' (1.829 m)    Final diagnoses:  AKI (acute kidney injury) ([place])    Admission/ observation were discussed with the admitting physician, patient and/or family and they are comfortable with the plan.    Final Clinical Impressions(s) / ED Diagnoses   Final diagnoses:  AKI (acute kidney injury) [facility])    New Prescriptions New Prescriptions   No medications on file     Deno Etienne, DO 11/22/16 1512

## 2016-11-23 ENCOUNTER — Observation Stay (HOSPITAL_COMMUNITY): Payer: Medicare Other

## 2016-11-23 ENCOUNTER — Encounter (HOSPITAL_COMMUNITY): Payer: Self-pay | Admitting: *Deleted

## 2016-11-23 DIAGNOSIS — R278 Other lack of coordination: Secondary | ICD-10-CM | POA: Diagnosis not present

## 2016-11-23 DIAGNOSIS — Z87891 Personal history of nicotine dependence: Secondary | ICD-10-CM | POA: Diagnosis not present

## 2016-11-23 DIAGNOSIS — R259 Unspecified abnormal involuntary movements: Secondary | ICD-10-CM | POA: Diagnosis not present

## 2016-11-23 DIAGNOSIS — E119 Type 2 diabetes mellitus without complications: Secondary | ICD-10-CM

## 2016-11-23 DIAGNOSIS — K219 Gastro-esophageal reflux disease without esophagitis: Secondary | ICD-10-CM | POA: Diagnosis present

## 2016-11-23 DIAGNOSIS — E785 Hyperlipidemia, unspecified: Secondary | ICD-10-CM | POA: Diagnosis not present

## 2016-11-23 DIAGNOSIS — M25561 Pain in right knee: Secondary | ICD-10-CM | POA: Diagnosis not present

## 2016-11-23 DIAGNOSIS — G939 Disorder of brain, unspecified: Secondary | ICD-10-CM | POA: Diagnosis not present

## 2016-11-23 DIAGNOSIS — N189 Chronic kidney disease, unspecified: Secondary | ICD-10-CM | POA: Diagnosis not present

## 2016-11-23 DIAGNOSIS — H9190 Unspecified hearing loss, unspecified ear: Secondary | ICD-10-CM | POA: Diagnosis present

## 2016-11-23 DIAGNOSIS — I10 Essential (primary) hypertension: Secondary | ICD-10-CM | POA: Diagnosis not present

## 2016-11-23 DIAGNOSIS — N183 Chronic kidney disease, stage 3 (moderate): Secondary | ICD-10-CM | POA: Diagnosis not present

## 2016-11-23 DIAGNOSIS — Z9114 Patient's other noncompliance with medication regimen: Secondary | ICD-10-CM | POA: Diagnosis not present

## 2016-11-23 DIAGNOSIS — N179 Acute kidney failure, unspecified: Secondary | ICD-10-CM | POA: Diagnosis not present

## 2016-11-23 DIAGNOSIS — M6281 Muscle weakness (generalized): Secondary | ICD-10-CM | POA: Diagnosis not present

## 2016-11-23 DIAGNOSIS — R296 Repeated falls: Secondary | ICD-10-CM | POA: Diagnosis not present

## 2016-11-23 DIAGNOSIS — R410 Disorientation, unspecified: Secondary | ICD-10-CM | POA: Diagnosis not present

## 2016-11-23 DIAGNOSIS — I129 Hypertensive chronic kidney disease with stage 1 through stage 4 chronic kidney disease, or unspecified chronic kidney disease: Secondary | ICD-10-CM | POA: Diagnosis present

## 2016-11-23 DIAGNOSIS — E1159 Type 2 diabetes mellitus with other circulatory complications: Secondary | ICD-10-CM | POA: Diagnosis not present

## 2016-11-23 DIAGNOSIS — D509 Iron deficiency anemia, unspecified: Secondary | ICD-10-CM | POA: Diagnosis present

## 2016-11-23 DIAGNOSIS — R2681 Unsteadiness on feet: Secondary | ICD-10-CM | POA: Diagnosis not present

## 2016-11-23 DIAGNOSIS — E1122 Type 2 diabetes mellitus with diabetic chronic kidney disease: Secondary | ICD-10-CM | POA: Diagnosis present

## 2016-11-23 DIAGNOSIS — R3129 Other microscopic hematuria: Secondary | ICD-10-CM | POA: Diagnosis present

## 2016-11-23 DIAGNOSIS — R938 Abnormal findings on diagnostic imaging of other specified body structures: Secondary | ICD-10-CM | POA: Diagnosis present

## 2016-11-23 LAB — CBC
HEMATOCRIT: 27.1 % — AB (ref 39.0–52.0)
Hemoglobin: 8.7 g/dL — ABNORMAL LOW (ref 13.0–17.0)
MCH: 28.8 pg (ref 26.0–34.0)
MCHC: 32.1 g/dL (ref 30.0–36.0)
MCV: 89.7 fL (ref 78.0–100.0)
Platelets: 239 10*3/uL (ref 150–400)
RBC: 3.02 MIL/uL — ABNORMAL LOW (ref 4.22–5.81)
RDW: 13.7 % (ref 11.5–15.5)
WBC: 9.2 10*3/uL (ref 4.0–10.5)

## 2016-11-23 LAB — BASIC METABOLIC PANEL
ANION GAP: 10 (ref 5–15)
BUN: 51 mg/dL — AB (ref 6–20)
CALCIUM: 8.6 mg/dL — AB (ref 8.9–10.3)
CO2: 20 mmol/L — ABNORMAL LOW (ref 22–32)
Chloride: 109 mmol/L (ref 101–111)
Creatinine, Ser: 3.38 mg/dL — ABNORMAL HIGH (ref 0.61–1.24)
GFR calc Af Amer: 17 mL/min — ABNORMAL LOW (ref >60)
GFR, EST NON AFRICAN AMERICAN: 15 mL/min — AB (ref >60)
Glucose, Bld: 119 mg/dL — ABNORMAL HIGH (ref 65–99)
POTASSIUM: 4.3 mmol/L (ref 3.5–5.1)
SODIUM: 139 mmol/L (ref 135–145)

## 2016-11-23 LAB — IRON AND TIBC
Iron: 19 ug/dL — ABNORMAL LOW (ref 45–182)
Saturation Ratios: 9 % — ABNORMAL LOW (ref 17.9–39.5)
TIBC: 206 ug/dL — ABNORMAL LOW (ref 250–450)
UIBC: 187 ug/dL

## 2016-11-23 LAB — RETICULOCYTES
RBC.: 3.17 MIL/uL — ABNORMAL LOW (ref 4.22–5.81)
RETIC COUNT ABSOLUTE: 38 10*3/uL (ref 19.0–186.0)
RETIC CT PCT: 1.2 % (ref 0.4–3.1)

## 2016-11-23 LAB — GLUCOSE, CAPILLARY
GLUCOSE-CAPILLARY: 104 mg/dL — AB (ref 65–99)
GLUCOSE-CAPILLARY: 125 mg/dL — AB (ref 65–99)
Glucose-Capillary: 126 mg/dL — ABNORMAL HIGH (ref 65–99)
Glucose-Capillary: 146 mg/dL — ABNORMAL HIGH (ref 65–99)

## 2016-11-23 LAB — VITAMIN B12: Vitamin B-12: 806 pg/mL (ref 180–914)

## 2016-11-23 LAB — FOLATE: Folate: 31.6 ng/mL (ref >5.9)

## 2016-11-23 LAB — PSA: PSA: 1.28 ng/mL (ref 0.00–4.00)

## 2016-11-23 LAB — FERRITIN: FERRITIN: 365 ng/mL — AB (ref 24–336)

## 2016-11-23 MED ORDER — HYDRALAZINE HCL 20 MG/ML IJ SOLN: 10.0000 mg | INTRAMUSCULAR | Status: DC | PRN

## 2016-11-23 MED ORDER — HYDRALAZINE HCL 10 MG PO TABS
10.0000 mg | ORAL_TABLET | Freq: Four times a day (QID) | ORAL | Status: DC
  Administered 2016-11-23: 10 mg via ORAL
  Filled 2016-11-23: qty 1

## 2016-11-23 MED ORDER — AMLODIPINE BESYLATE 10 MG PO TABS
10.0000 mg | ORAL_TABLET | Freq: Every day | ORAL | Status: DC
  Administered 2016-11-23 – 2016-11-26 (×4): 10 mg via ORAL
  Filled 2016-11-23 (×4): qty 1

## 2016-11-23 MED ORDER — HYDRALAZINE HCL 20 MG/ML IJ SOLN
10.0000 mg | Freq: Once | INTRAMUSCULAR | Status: AC
  Administered 2016-11-23: 10 mg via INTRAVENOUS
  Filled 2016-11-23: qty 1

## 2016-11-23 NOTE — Progress Notes (Signed)
Patients b/p 202/103. Page out to Dr. Waldron Labs

## 2016-11-23 NOTE — Progress Notes (Signed)
PROGRESS NOTE                                                                                                                                                                                                             Patient Demographics:    John Barr, is a 83 y.o. male, DOB - 1927/03/19, [id number]  Admit date - 11/22/2016   Admitting Physician Maren Reamer, MD  Outpatient Primary MD for the patient is Wyatt Haste, MD  LOS - 0  Outpatient Specialists: Currently no PCP, arrangement is being made by family for him to start being followed by [facility] care nurse felt  Chief Complaint  Patient presents with  . Weakness       Brief Narrative   83 year old male with history of hypertension, diabetes, see daily, baseline creatinine 1.8, presents with multiple falls, workup significant for q hematuria, worsening creatinine to 3.8 on admission, and not taking any medications over last few weeks as no PCP yet(his PCP retired in November, could not keep his appointment with [facility]).   Subjective:    John Barr today has, No headache, No chest pain, No abdominal pain - No Nausea, No new weakness tingling or numbness,   Assessment  & Plan :    Principal Problem:   AKI (acute kidney injury) ([place]) Active Problems:   Diabetes ([place])   Hypertension associated with diabetes ([place])   Hyperlipidemia associated with type 2 diabetes mellitus ([place])   Frequent falls   Multiple falls - Most likely due to worsening renal failure, and uncontrolled blood pressure, consult PT, will obtain CT head. - Negative urinalysis, negative influenza  AKI on CKD stage III - Is most likely related to not taking and his home medication, and uncontrolled diabetes and hypertension. - chronic kidney disease, as renal ultrasound with increased echogenicity - Proving with IV fluids, continue with normal sinus 75 mL per hour  Abnormal prostate ON  ultrasound with  microscopic hematuria - Never seen a neurologist before, will check PSA, may need to consult pending PSA levels  Hypertension - Uncontrolled, will start on amlodipine, will on when necessary hydralazine - Has not been taking his benazepril and hydrochlorothiazide for couple months now, we will continue to hold given renal failure  Diabetes mellitus mellitus - Was on metformin at home, which hasn't been taking as well, -  Continue with insulin sliding scale, check hemoglobin A1c  Anemia - Hemoglobin of 8.7 today, will check anemia panel  Hyperlipidemia - Resume statin  Code Status : Full  Family Communication  : Daughter via phone  Disposition Plan  : Pending PT consult  Consults  :  None  Procedures  : None  DVT Prophylaxis  :   Heparin - SCDs   Lab Results  Component Value Date   PLT 239 11/23/2016    Antibiotics  :    Anti-infectives    None        Objective:   Vitals:   11/23/16 0545 11/23/16 0842 11/23/16 0951 11/23/16 1024  BP: (!) 178/91 (!) 202/103 (!) 202/103 (!) 146/74  Pulse: 82  81   Resp: 18  18   Temp: 97.7 F (36.5 C)  98.4 F (36.9 C)   TempSrc: Oral  Oral   SpO2: 99%  98%   Weight:      Height:        Wt Readings from Last 3 Encounters:  11/22/16 85.5 kg (188 lb 7.9 oz)  09/27/14 92.5 kg (204 lb)  05/24/14 92.5 kg (204 lb)     Intake/Output Summary (Last 24 hours) at 11/23/16 1359 Last data filed at 11/23/16 1344  Gross per 24 hour  Intake          2278.75 ml  Output              900 ml  Net          1378.75 ml     Physical Exam  Awake Alert, Communicative, pleasant, speaks with a husky voice which is his baseline Supple Neck,No JVD  Symmetrical Chest wall movement, Good air movement bilaterally, CTAB RRR,No Gallops,Rubs or new Murmurs, No Parasternal Heave +ve B.Sounds, Abd Soft, No tendernes, No rebound - guarding or rigidity. No Cyanosis, Clubbing or edema, No new Rash or bruise     Data Review:    CBC  Recent  Labs Lab 11/22/16 1212 11/23/16 0618  WBC 9.5 9.2  HGB 9.2* 8.7*  HCT 28.5* 27.1*  PLT 243 239  MCV 89.3 89.7  MCH 28.8 28.8  MCHC 32.3 32.1  RDW 13.9 13.7    Chemistries   Recent Labs Lab 11/22/16 1212 11/23/16 0618  NA 140 139  K 4.9 4.3  CL 108 109  CO2 22 20*  GLUCOSE 145* 119*  BUN 58* 51*  CREATININE 3.76* 3.38*  CALCIUM 9.3 8.6*   ------------------------------------------------------------------------------------------------------------------ No results for input(s): CHOL, HDL, LDLCALC, TRIG, CHOLHDL, LDLDIRECT in the last 72 hours.  Lab Results  Component Value Date   HGBA1C 6.7 09/27/2014   ------------------------------------------------------------------------------------------------------------------ No results for input(s): TSH, T4TOTAL, T3FREE, THYROIDAB in the last 72 hours.  Invalid input(s): FREET3 ------------------------------------------------------------------------------------------------------------------ No results for input(s): VITAMINB12, FOLATE, FERRITIN, TIBC, IRON, RETICCTPCT in the last 72 hours.  Coagulation profile No results for input(s): INR, PROTIME in the last 168 hours.  No results for input(s): DDIMER in the last 72 hours.  Cardiac Enzymes No results for input(s): CKMB, TROPONINI, MYOGLOBIN in the last 168 hours.  Invalid input(s): CK ------------------------------------------------------------------------------------------------------------------ No results found for: BNP  Inpatient Medications  Scheduled Meds: . amLODipine  10 mg Oral Daily  . heparin  5,000 Units Subcutaneous q12n4p  . insulin aspart  0-9 Units Subcutaneous TID WC  . sodium chloride flush  3 mL Intravenous Q12H   Continuous Infusions: . sodium chloride 75 mL/hr at 11/23/16 0842   PRN Meds:.acetaminophen, hydrALAZINE  Micro Results No results found for this or any previous visit (from the past 240 hour(s)).  Radiology Reports Dg Chest 2  View  Result Date: 11/22/2016 CLINICAL DATA:  Generalized weakness for 1 month EXAM: CHEST  2 VIEW COMPARISON:  None. FINDINGS: Mild cardiomegaly. Lungs are clear. No effusions or edema. No acute bony abnormality. IMPRESSION: Cardiomegaly.  No active disease. Electronically Signed   By: Rolm Baptise M.D.   On: 11/22/2016 13:09   US Renal  Result Date: 11/22/2016 CLINICAL DATA:  Acute renal insufficiency. EXAM: RENAL / URINARY TRACT ULTRASOUND COMPLETE COMPARISON:  None. FINDINGS: Right Kidney: Length: 8.3 cm.  Diffusely increased echogenicity of the cortex. Left Kidney: Length: 10.5 cm. Mildly increased echogenicity. No mass or hydronephrosis visualized. Bladder: Appears normal for degree of bladder distention. The prostate gland is bulbous and enlarged measuring 5.3 x 5.0 x 5.0 cm. IMPRESSION: Increased echogenicity of the kidneys, consistent with medical renal disease. Abnormal appearance and enlargement of the prostate gland. Please correlate to patient's serum PSA values. Electronically Signed   By: Fidela Salisbury M.D.   On: 11/22/2016 15:29    Time Spent in minutes  25   John Barr M.D on 11/23/2016 at 1:59 PM  Between 7am to 7pm - Pager - [phone]  After 7pm go to [email or web address] - password [email or web address]  [facility] -  Office  [phone]

## 2016-11-23 NOTE — Progress Notes (Signed)
Order stated to d/c droplet precautions if flu pcr was negative. Results were negative and precautions d/c.   Eleanora Neighbor, RN

## 2016-11-23 NOTE — Evaluation (Signed)
Physical Therapy Evaluation Patient Details Name: John Barr MRN: [id number] DOB: Feb 11, 1927 Today's Date: 11/23/2016   History of Present Illness  Patient is an 83 yo male admitted 11/22/16 with weakness and inability to walk.  Patient with AKI, weakness, HTN, and decreased cognition.    PMH:  falls, HTN, DM, CKD, arthritis, HLD, per chart - cognitive/memory issues not formally diagnosed  Clinical Impression  Patient presents with problems listed below.  Will benefit from acute PT to maximize functional mobility prior to discharge.  Per patient, he was ambulatory pta.  Today, patient requiring +2 assist for all mobility and unable to stand.  Recommend SNF at d/c for continued therapy.    Follow Up Recommendations SNF;Supervision/Assistance - 24 hour    Equipment Recommendations  Wheelchair (measurements PT);Wheelchair cushion (measurements PT)    Recommendations for Other Services       Precautions / Restrictions Precautions Precautions: Fall Precaution Comments: falls pta Restrictions Weight Bearing Restrictions: No      Mobility  Bed Mobility Overal bed mobility: Needs Assistance Bed Mobility: Supine to Sit;Sit to Supine     Supine to sit: Mod assist;+2 for safety/equipment;HOB elevated Sit to supine: Mod assist;+2 for physical assistance;HOB elevated   General bed mobility comments: Verbal cues to move to EOB.  Required assist to move LLE to EOB, and to raise trunk to sitting position.  Once upright, patient required min-mod assist to maintain sitting balance.   Required +2 assist to return to supine.  Transfers Overall transfer level: Needs assistance Equipment used: Rolling walker (2 wheeled) Transfers: Sit to/from Stand Sit to Stand: Max assist;+2 physical assistance         General transfer comment: Verbal and tactile cues for hand placement.  Patient requiring +2 max assist to attempt to stand x 2.  On both attempts, patient unable to come to fully upright  position.  Hips and knees flexed with head down.  Verbal and tactile cues to extend hips and look up.  Able to stay on feet for 20 seconds at best.  Ambulation/Gait             General Gait Details: Unable  Stairs            Wheelchair Mobility    Modified Rankin (Stroke Patients Only)       Balance Overall balance assessment: Needs assistance;History of Falls Sitting-balance support: Single extremity supported;Feet supported Sitting balance-Leahy Scale: Poor   Postural control: Posterior lean;Right lateral lean Standing balance support: Bilateral upper extremity supported Standing balance-Leahy Scale: Zero                               Pertinent Vitals/Pain Pain Assessment: Faces Faces Pain Scale: Hurts little more Pain Location: LLE Pain Descriptors / Indicators: Grimacing Pain Intervention(s): Limited activity within patient's tolerance;Monitored during session;Repositioned    Home Living Family/patient expects to be discharged to:: Unsure Living Arrangements: Children             Home Equipment: Gilford Rile - 4 wheels Additional Comments: Patient unable to provide information.  He states he lives with wife and daughter, and that he takes his wife to HD.  No family present to provide accurate information.    Prior Function Level of Independence:  (Unsure)         Comments: Patient unable to provide information.  Reports he walks with a rollator.     Hand Dominance   Dominant Hand: Right  Extremity/Trunk Assessment   Upper Extremity Assessment Upper Extremity Assessment: Generalized weakness    Lower Extremity Assessment Lower Extremity Assessment: Generalized weakness;LLE deficits/detail LLE Deficits / Details: Strength grossly 3-/5;  Pain limiting movement;  Unable to fully extend Lt knee LLE: Unable to fully assess due to pain LLE Coordination: decreased gross motor    Cervical / Trunk Assessment Cervical / Trunk  Assessment: Kyphotic  Communication   Communication: No difficulties  Cognition Arousal/Alertness: Awake/alert Behavior During Therapy: Anxious;Flat affect Overall Cognitive Status: No family/caregiver present to determine baseline cognitive functioning Area of Impairment: Orientation;Attention;Memory;Following commands;Safety/judgement;Awareness;Problem solving Orientation Level: Disoriented to;Time;Situation Current Attention Level: Sustained Memory: Decreased short-term memory Following Commands: Follows one step commands inconsistently;Follows one step commands with increased time Safety/Judgement: Decreased awareness of deficits;Decreased awareness of safety   Problem Solving: Slow processing;Difficulty sequencing;Requires verbal cues;Requires tactile cues General Comments: Patient having difficulty answering questions.  Repeats himself multiple times - Wounded in VN; People at [place] won't listen to me about what's wrong with my leg;  I'm responsible for my wife.   Needs repeated cueing to stay on task.    General Comments      Exercises     Assessment/Plan    PT Assessment Patient needs continued PT services  PT Problem List Decreased strength;Decreased range of motion;Decreased activity tolerance;Decreased balance;Decreased mobility;Decreased coordination;Decreased cognition;Decreased knowledge of use of DME;Decreased safety awareness;Pain          PT Treatment Interventions DME instruction;Gait training;Functional mobility training;Therapeutic activities;Balance training;Therapeutic exercise;Cognitive remediation;Patient/family education    PT Goals (Current goals can be found in the Care Plan section)  Acute Rehab PT Goals Patient Stated Goal: None stated PT Goal Formulation: Patient unable to participate in goal setting Time For Goal Achievement: 12/07/16 Potential to Achieve Goals: Fair    Frequency Min 3X/week   Barriers to discharge        Co-evaluation                End of Session Equipment Utilized During Treatment: Gait belt Activity Tolerance: Patient limited by fatigue;Patient limited by pain Patient left: in bed;with call bell/phone within reach;with bed alarm set Nurse Communication: Mobility status;Need for lift equipment    Functional Assessment Tool Used: Clinical judgement Functional Limitation: Mobility: Walking and moving around Mobility: Walking and Moving Around Current Status [phone]): At least 60 percent but less than 80 percent impaired, limited or restricted Mobility: Walking and Moving Around Goal Status [phone]): At least 20 percent but less than 40 percent impaired, limited or restricted    Time: 1424-1443 PT Time Calculation (min) (ACUTE ONLY): 19 min   Charges:   PT Evaluation $PT Eval Moderate Complexity: 1 Procedure     PT G Codes:   PT G-Codes **NOT FOR INPATIENT CLASS** Functional Assessment Tool Used: Clinical judgement Functional Limitation: Mobility: Walking and moving around Mobility: Walking and Moving Around Current Status [phone]): At least 60 percent but less than 80 percent impaired, limited or restricted Mobility: Walking and Moving Around Goal Status [phone]): At least 20 percent but less than 40 percent impaired, limited or restricted    Despina Pole 11/23/2016, 7:28 PM Carita Pian. Sanjuana Kava, [place] Pager [phone]

## 2016-11-24 DIAGNOSIS — G9389 Other specified disorders of brain: Secondary | ICD-10-CM

## 2016-11-24 DIAGNOSIS — I10 Essential (primary) hypertension: Secondary | ICD-10-CM

## 2016-11-24 DIAGNOSIS — E1159 Type 2 diabetes mellitus with other circulatory complications: Secondary | ICD-10-CM

## 2016-11-24 DIAGNOSIS — R296 Repeated falls: Secondary | ICD-10-CM

## 2016-11-24 DIAGNOSIS — G939 Disorder of brain, unspecified: Secondary | ICD-10-CM

## 2016-11-24 DIAGNOSIS — N179 Acute kidney failure, unspecified: Principal | ICD-10-CM

## 2016-11-24 LAB — CBC
HCT: 27.3 % — ABNORMAL LOW (ref 39.0–52.0)
HEMOGLOBIN: 8.9 g/dL — AB (ref 13.0–17.0)
MCH: 29.1 pg (ref 26.0–34.0)
MCHC: 32.6 g/dL (ref 30.0–36.0)
MCV: 89.2 fL (ref 78.0–100.0)
Platelets: 258 10*3/uL (ref 150–400)
RBC: 3.06 MIL/uL — AB (ref 4.22–5.81)
RDW: 13.7 % (ref 11.5–15.5)
WBC: 12.3 10*3/uL — ABNORMAL HIGH (ref 4.0–10.5)

## 2016-11-24 LAB — GLUCOSE, CAPILLARY
GLUCOSE-CAPILLARY: 130 mg/dL — AB (ref 65–99)
Glucose-Capillary: 109 mg/dL — ABNORMAL HIGH (ref 65–99)
Glucose-Capillary: 146 mg/dL — ABNORMAL HIGH (ref 65–99)
Glucose-Capillary: 145 mg/dL — ABNORMAL HIGH (ref 65–99)
Glucose-Capillary: 111 mg/dL — ABNORMAL HIGH (ref 65–99)

## 2016-11-24 LAB — BASIC METABOLIC PANEL
ANION GAP: 10 (ref 5–15)
BUN: 51 mg/dL — ABNORMAL HIGH (ref 6–20)
CHLORIDE: 108 mmol/L (ref 101–111)
CO2: 20 mmol/L — ABNORMAL LOW (ref 22–32)
CREATININE: 3.28 mg/dL — AB (ref 0.61–1.24)
Calcium: 8.8 mg/dL — ABNORMAL LOW (ref 8.9–10.3)
GFR calc non Af Amer: 16 mL/min — ABNORMAL LOW (ref >60)
GFR, EST AFRICAN AMERICAN: 18 mL/min — AB (ref >60)
Glucose, Bld: 141 mg/dL — ABNORMAL HIGH (ref 65–99)
Potassium: 4.1 mmol/L (ref 3.5–5.1)
SODIUM: 138 mmol/L (ref 135–145)

## 2016-11-24 LAB — HEMOGLOBIN A1C
HEMOGLOBIN A1C: 6.6 % — AB (ref 4.8–5.6)
MEAN PLASMA GLUCOSE: 143 mg/dL

## 2016-11-24 MED ORDER — FERROUS SULFATE 325 (65 FE) MG PO TABS
325.0000 mg | ORAL_TABLET | Freq: Two times a day (BID) | ORAL | Status: DC
  Administered 2016-11-24 – 2016-11-26 (×5): 325 mg via ORAL
  Filled 2016-11-24 (×5): qty 1

## 2016-11-24 MED ORDER — METOPROLOL TARTRATE 12.5 MG HALF TABLET
12.5000 mg | ORAL_TABLET | Freq: Two times a day (BID) | ORAL | Status: DC
  Administered 2016-11-24 – 2016-11-25 (×3): 12.5 mg via ORAL
  Filled 2016-11-24 (×3): qty 1

## 2016-11-24 MED ORDER — TRAZODONE HCL 50 MG PO TABS
50.0000 mg | ORAL_TABLET | Freq: Once | ORAL | Status: AC
  Administered 2016-11-24: 50 mg via ORAL
  Filled 2016-11-24: qty 1

## 2016-11-24 NOTE — Clinical Social Work Note (Signed)
Clinical Social Work Assessment  Patient Details  Name: John Barr MRN: [id number] Date of Birth: [birth year]  Date of referral:  11/24/16               Reason for consult:  Facility Placement, Discharge Planning                Permission sought to share information with:  Facility Sport and exercise psychologist, Family Supports Permission granted to share information::  Yes, Verbal Permission Granted  Name::     Counsellor::  SNF's  Relationship::  Daughter  Contact Information:  [phone]  Housing/Transportation Living arrangements for the past 2 months:  [place] of Information:  Medical Team, Adult Children Patient Interpreter Needed:  None Criminal Activity/Legal Involvement Pertinent to Current Situation/Hospitalization:  No - Comment as needed Significant Relationships:  Adult Children, Spouse Lives with:  Adult Children, Spouse Do you feel safe going back to the place where you live?  Yes Need for family participation in patient care:  Yes (Comment)  Care giving concerns:  PT recommending SNF once medically stable for discharge.   Social Worker assessment / plan:  CSW met with patient. Daughter at bedside. Patient oriented only to self. CSW introduced role and explained that PT recommendations would be discussed. Patient's daughter agreeable to SNF. No preference on facility. CSW provided SNF list. Patient will need PTAR. No further concerns. CSW encouraged patient's daughter to contact CSW as needed. CSW will continue to follow patient for support and facilitate discharge to SNF once medically stable.  Employment status:  Retired Nurse, adult PT Recommendations:  [place] / Referral to community resources:  [place]  Patient/Family's Response to care:  Patient oriented only to self. Patient's daughter agreeable to SNF placement. Patient's family supportive and involved in  patient's care. Patient's daughter appreciated social work intervention.  Patient/Family's Understanding of and Emotional Response to Diagnosis, Current Treatment, and Prognosis:  Patient oriented only to self. Patient's daughter has a good understanding of reason for current admission. Patient's daughter appears happy with hospital care.  Emotional Assessment Appearance:  Appears stated age Attitude/Demeanor/Rapport:  Unable to Assess Affect (typically observed):  Unable to Assess Orientation:  Oriented to Self Alcohol / Substance use:  Never Used Psych involvement (Current and /or in the community):  No (Comment)  Discharge Needs  Concerns to be addressed:  Care Coordination Readmission within the last 30 days:  No Current discharge risk:  Cognitively Impaired, Dependent with Mobility Barriers to Discharge:  Continued Medical Work up   Candie Chroman, LCSW 11/24/2016, 4:32 PM

## 2016-11-24 NOTE — NC FL2 (Signed)
[place] LEVEL OF CARE SCREENING TOOL     IDENTIFICATION  Patient Name: John Barr Birthdate: September 26, 1927 Sex: male Admission Date (Current Location): 11/22/2016  [facility] and [place] Number:  Herbalist and Address:  [facility], [place] [address], [place], [place] [address]      Provider Number: [id number]  Attending Physician Name and Address:  Rosita Fire, MD  Relative Name and Phone Number:       Current Level of Care: Hospital Recommended Level of Care: [place] Prior Approval Number:    Date Approved/Denied:   PASRR Number: [id number]  Discharge Plan: SNF    Current Diagnoses: Patient Active Problem List   Diagnosis Date Noted  . Brain mass   . AKI (acute kidney injury) ([place]) 11/22/2016  . Frequent falls 11/22/2016  . Hyperlipidemia associated with type 2 diabetes mellitus ([place]) 09/27/2014  . Diabetes ([place]) 07/04/2013  . Hypertension associated with diabetes ([place]) 07/04/2013  . Arthritis 07/04/2013    Orientation RESPIRATION BLADDER Height & Weight     Self  Normal Continent Weight: 190 lb 8 oz (86.4 kg) Height:  6' (182.9 cm)  BEHAVIORAL SYMPTOMS/MOOD NEUROLOGICAL BOWEL NUTRITION STATUS   (None)  (None) Continent Diet (Renal/carb modified)  AMBULATORY STATUS COMMUNICATION OF NEEDS Skin   Extensive Assist Verbally Other (Comment) Physiological scientist)                       Personal Care Assistance Level of Assistance              Functional Limitations Info  Sight, Hearing, Speech Sight Info: Adequate Hearing Info: Adequate Speech Info: Adequate    SPECIAL CARE FACTORS FREQUENCY  PT (By licensed PT), Blood pressure, Diabetic urine testing     PT Frequency: 5 x week              Contractures Contractures Info: Not present    Additional Factors Info  Code Status, Allergies Code Status Info: Full Allergies Info: NKDA           Current Medications (11/24/2016):   This is the current hospital active medication list Current Facility-Administered Medications  Medication Dose Route Frequency Provider Last Rate Last Dose  . 0.9 %  sodium chloride infusion   Intravenous Continuous Maren Reamer, MD 75 mL/hr at 11/23/16 [phone]    . acetaminophen (TYLENOL) tablet 1,000 mg  1,000 mg Oral Q6H PRN Maren Reamer, MD      . amLODipine (NORVASC) tablet 10 mg  10 mg Oral Daily Albertine Patricia, MD   10 mg at 11/24/16 1109  . ferrous sulfate tablet 325 mg  325 mg Oral BID WC Dron Tanna Furry, MD      . heparin injection 5,000 Units  5,000 Units Subcutaneous q12n4p Maren Reamer, MD   5,000 Units at 11/24/16 1611  . hydrALAZINE (APRESOLINE) injection 10 mg  10 mg Intravenous Q4H PRN Albertine Patricia, MD      . insulin aspart (novoLOG) injection 0-9 Units  0-9 Units Subcutaneous TID WC Maren Reamer, MD   1 Units at 11/24/16 0804  . metoprolol tartrate (LOPRESSOR) tablet 12.5 mg  12.5 mg Oral BID Dron Tanna Furry, MD   12.5 mg at 11/24/16 1610  . sodium chloride flush (NS) 0.9 % injection 3 mL  3 mL Intravenous Q12H Maren Reamer, MD   3 mL at 11/24/16 1112     Discharge  Medications: Please see discharge summary for a list of discharge medications.  Relevant Imaging Results:  Relevant Lab Results:   Additional Information SS#: [id number]  Candie Chroman, LCSW

## 2016-11-24 NOTE — Progress Notes (Signed)
PROGRESS NOTE    John Barr  [id number] DOB: June 03, 1927 DOA: 11/22/2016 PCP: Wyatt Haste, MD   Brief Narrative: 83 year old male with history of hypertension, diabetes, CKD baseline creatinine 1.8, presents with multiple falls, workup significant for hematuria, worsening creatinine to 3.8 on admission, and not taking any medications over last few weeks as no PCP yet(his PCP retired in November, could not keep his appointment with [place]).  Assessment & Plan:  #  Multiple falls - Likely physical deconditioning. 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  -CT scan of head consistent with mass lesion within the suprasellar cistern which may be neoplastic or vascular. -I will order MRI of head for further characterization. Unable to use IV contrast because of renal failure. -PT, OT evaluation and treatment. Patient may benefit from rehabilitation on discharge. Social worker consulted.  # AKI on CKD stage III vs progressive CKD. -Ultrasound of kidneys consistent with increased echogenicity consistent with chronic nature of kidney disease. - Currently on IV fluid. Monitor BMP.  # Abnormal prostate on ultrasound with microscopic hematuria - Never seen a urologist before,  PSA level: 1.28  # Hypertension - Continue amlodipine.I will add low-dose metoprolol. Monitor blood pressure and heart rate closely.  # Diabetes mellitus mellitus; a1c 6.6. Supposed to be on metformin at home but he has not been taking. Continue sliding scale. Monitor blood sugar level.  # Anemia, mild iron deficiency anemia:  - Hemoglobin of 8.9 today -start oral iron.   Principal Problem:   AKI (acute kidney injury) ([place]) Active Problems:   Diabetes ([place])   Hypertension associated with diabetes ([place])   Hyperlipidemia associated with type 2 diabetes mellitus ([place])   Frequent falls  DVT prophylaxis: Heparin subcutaneous Code Status: Full code Family Communication: No family present  at bedside Disposition Plan: Likely discharge home versus rehabilitation in 1-2 days    Consultants:   None  Procedures: None Antimicrobials: None  Subjective: Patient was seen and examined at bedside. Patient reported generalized weakness. Denied nausea vomiting chest pain or shortness of breath. No headache or dizziness.  Objective: Vitals:   11/23/16 1710 11/23/16 2048 11/24/16 0447 11/24/16 0920  BP: (!) 147/76 (!) 172/84 (!) 172/77 (!) 162/74  Pulse: 95 (!) 105 (!) 104 92  Resp: 18 19 19 16   Temp: 98.3 F (36.8 C) 98.6 F (37 C) 97.7 F (36.5 C) 98.2 F (36.8 C)  TempSrc: Oral Oral Oral Oral  SpO2: 99% 99% 98% 100%  Weight:  86.4 kg (190 lb 8 oz)    Height:        Intake/Output Summary (Last 24 hours) at 11/24/16 1532 Last data filed at 11/24/16 1403  Gross per 24 hour  Intake              123 ml  Output              550 ml  Net             -427 ml   Filed Weights   11/22/16 1741 11/22/16 2055 11/23/16 2048  Weight: 85.5 kg (188 lb 8 oz) 85.5 kg (188 lb 7.9 oz) 86.4 kg (190 lb 8 oz)    Examination:  General exam: Appears calm and comfortable  Respiratory system: Clear to auscultation. Respiratory effort normal. No wheezing or crackle Cardiovascular system: S1 & S2 heard, RRR.  No murmur appreciated Gastrointestinal system: Abdomen is nondistended, soft and nontender. Normal bowel sounds heard. Central nervous system: Alert, awake and following  commands. Extremities: no LE edema Skin: No rashes, lesions or ulcers Psychiatry: Judgement and insight appear normal. Mood & affect appropriate.     Data Reviewed: I have personally reviewed following labs and imaging studies  CBC:  Recent Labs Lab 11/22/16 1212 11/23/16 0618 11/24/16 0511  WBC 9.5 9.2 12.3*  HGB 9.2* 8.7* 8.9*  HCT 28.5* 27.1* 27.3*  MCV 89.3 89.7 89.2  PLT 243 239 0000000   Basic Metabolic Panel:  Recent Labs Lab 11/22/16 1212 11/23/16 0618 11/24/16 0511  NA 140 139 138  K 4.9  4.3 4.1  CL 108 109 108  CO2 22 20* 20*  GLUCOSE 145* 119* 141*  BUN 58* 51* 51*  CREATININE 3.76* 3.38* 3.28*  CALCIUM 9.3 8.6* 8.8*   GFR: Estimated Creatinine Clearance: 17.4 mL/min (by C-G formula based on SCr of 3.28 mg/dL (H)). Liver Function Tests: No results for input(s): AST, ALT, ALKPHOS, BILITOT, PROT, ALBUMIN in the last 168 hours. No results for input(s): LIPASE, AMYLASE in the last 168 hours. No results for input(s): AMMONIA in the last 168 hours. Coagulation Profile: No results for input(s): INR, PROTIME in the last 168 hours. Cardiac Enzymes: No results for input(s): CKTOTAL, CKMB, CKMBINDEX, TROPONINI in the last 168 hours. BNP (last 3 results) No results for input(s): PROBNP in the last 8760 hours. HbA1C:  Recent Labs  11/23/16 1542  HGBA1C 6.6*   CBG:  Recent Labs Lab 11/23/16 1121 11/23/16 1614 11/23/16 2045 11/24/16 0744 11/24/16 1211  GLUCAP 126* 125* 146* 130* 109*   Lipid Profile: No results for input(s): CHOL, HDL, LDLCALC, TRIG, CHOLHDL, LDLDIRECT in the last 72 hours. Thyroid Function Tests: No results for input(s): TSH, T4TOTAL, FREET4, T3FREE, THYROIDAB in the last 72 hours. Anemia Panel:  Recent Labs  11/23/16 1542 11/23/16 1543  VITAMINB12 806  --   FOLATE  --  31.6  FERRITIN 365*  --   TIBC 206*  --   IRON 19*  --   RETICCTPCT 1.2  --    Sepsis Labs: No results for input(s): PROCALCITON, LATICACIDVEN in the last 168 hours.  No results found for this or any previous visit (from the past 240 hour(s)).       Radiology Studies: Ct Head Wo Contrast  Result Date: 11/23/2016 CLINICAL DATA:  83 y/o  M; frequent falls at home. EXAM: CT HEAD WITHOUT CONTRAST TECHNIQUE: Contiguous axial images were obtained from the base of the skull through the vertex without intravenous contrast. COMPARISON:  None. FINDINGS: Brain: No evidence of acute infarction, hemorrhage, hydrocephalus, extra-axial collection or focal mass effect.  Nonspecific foci of hypoattenuation in subcortical and periventricular white matter is consistent with moderate chronic microvascular ischemic changes. Moderate brain parenchymal volume loss. Small lucencies in the basal ganglia bilaterally may represent prominent perivascular spaces or old lacunar infarcts. Mass lesion within the suprasellar cistern measuring 12 x 13 x 5 mm (coronal image 35 and sagittal image 35). Vascular: Extensive calcific atherosclerosis of cavernous and paraclinoid internal carotid arteries. Skull: Normal. Negative for fracture or focal lesion. Sinuses/Orbits: Right maxillary sinus mucous retention cyst. Otherwise negative. Other: None. IMPRESSION: 1. No acute intracranial abnormality. 2. Mass lesion within the suprasellar cistern which may be neoplastic or vascular. MRI of the head with and without contrast and MRA of the head without contrast is recommended for further characterization. 3. Moderate chronic microvascular ischemic changes and parenchymal volume loss of the brain. These results will be called to the ordering clinician or representative by the Radiologist Assistant, and  communication documented in the PACS or zVision Dashboard. Electronically Signed   By: Kristine Garbe M.D.   On: 11/23/2016 18:17        Scheduled Meds: . amLODipine  10 mg Oral Daily  . heparin  5,000 Units Subcutaneous q12n4p  . insulin aspart  0-9 Units Subcutaneous TID WC  . sodium chloride flush  3 mL Intravenous Q12H   Continuous Infusions: . sodium chloride 75 mL/hr at 11/23/16 0842     LOS: 1 day    Dron Tanna Furry, MD [facility] Pager [phone]  If 7PM-7AM, please contact night-coverage [email or web address] Password [facility] 11/24/2016, 3:32 PM

## 2016-11-24 NOTE — Clinical Social Work Placement (Signed)
   CLINICAL SOCIAL WORK PLACEMENT  NOTE  Date:  11/24/2016  Patient Details  Name: John Barr MRN: [id number] Date of Birth: [birth year]  Clinical Social Work is seeking post-discharge placement for this patient at the [place] level of care (*CSW will initial, date and re-position this form in  chart as items are completed):  Yes   Patient/family provided with [place] Work Department's list of facilities offering this level of care within the geographic area requested by the patient (or if unable, by the patient's family).  Yes   Patient/family informed of their freedom to choose among providers that offer the needed level of care, that participate in Medicare, Medicaid or managed care program needed by the patient, have an available bed and are willing to accept the patient.  Yes   Patient/family informed of [facility]'s ownership interest in [facility] and [facility], as well as of the fact that they are under no obligation to receive care at these facilities.  PASRR submitted to EDS on 11/24/16     PASRR number received on 11/24/16     Existing PASRR number confirmed on       FL2 transmitted to all facilities in geographic area requested by pt/family on 11/24/16     FL2 transmitted to all facilities within larger geographic area on       Patient informed that his/her managed care company has contracts with or will negotiate with certain facilities, including the following:            Patient/family informed of bed offers received.  Patient chooses bed at       Physician recommends and patient chooses bed at      Patient to be transferred to   on  .  Patient to be transferred to facility by       Patient family notified on   of transfer.  Name of family member notified:        PHYSICIAN Please sign FL2     Additional Comment:    _______________________________________________ Candie Chroman, LCSW 11/24/2016, 4:34  PM

## 2016-11-25 ENCOUNTER — Inpatient Hospital Stay (HOSPITAL_COMMUNITY): Payer: Medicare Other

## 2016-11-25 LAB — CBC
HEMATOCRIT: 26.7 % — AB (ref 39.0–52.0)
HEMOGLOBIN: 8.6 g/dL — AB (ref 13.0–17.0)
MCH: 28.7 pg (ref 26.0–34.0)
MCHC: 32.2 g/dL (ref 30.0–36.0)
MCV: 89 fL (ref 78.0–100.0)
Platelets: 253 10*3/uL (ref 150–400)
RBC: 3 MIL/uL — ABNORMAL LOW (ref 4.22–5.81)
RDW: 13.8 % (ref 11.5–15.5)
WBC: 12.4 10*3/uL — ABNORMAL HIGH (ref 4.0–10.5)

## 2016-11-25 LAB — RENAL FUNCTION PANEL
ALBUMIN: 3.1 g/dL — AB (ref 3.5–5.0)
ANION GAP: 7 (ref 5–15)
BUN: 48 mg/dL — AB (ref 6–20)
CALCIUM: 8.6 mg/dL — AB (ref 8.9–10.3)
CO2: 21 mmol/L — AB (ref 22–32)
Chloride: 112 mmol/L — ABNORMAL HIGH (ref 101–111)
Creatinine, Ser: 3.02 mg/dL — ABNORMAL HIGH (ref 0.61–1.24)
GFR calc Af Amer: 20 mL/min — ABNORMAL LOW (ref >60)
GFR calc non Af Amer: 17 mL/min — ABNORMAL LOW (ref >60)
GLUCOSE: 138 mg/dL — AB (ref 65–99)
PHOSPHORUS: 4.6 mg/dL (ref 2.5–4.6)
POTASSIUM: 4.8 mmol/L (ref 3.5–5.1)
SODIUM: 140 mmol/L (ref 135–145)

## 2016-11-25 LAB — GLUCOSE, CAPILLARY
GLUCOSE-CAPILLARY: 108 mg/dL — AB (ref 65–99)
Glucose-Capillary: 150 mg/dL — ABNORMAL HIGH (ref 65–99)
Glucose-Capillary: 120 mg/dL — ABNORMAL HIGH (ref 65–99)
Glucose-Capillary: 130 mg/dL — ABNORMAL HIGH (ref 65–99)

## 2016-11-25 MED ORDER — HEPARIN SODIUM (PORCINE) 5000 UNIT/ML IJ SOLN
5000.0000 [IU] | Freq: Three times a day (TID) | INTRAMUSCULAR | Status: DC
  Administered 2016-11-25 – 2016-11-26 (×3): 5000 [IU] via SUBCUTANEOUS
  Filled 2016-11-25 (×3): qty 1

## 2016-11-25 MED ORDER — DOCUSATE SODIUM 100 MG PO CAPS
100.0000 mg | ORAL_CAPSULE | Freq: Two times a day (BID) | ORAL | Status: DC
  Administered 2016-11-25 – 2016-11-26 (×3): 100 mg via ORAL
  Filled 2016-11-25 (×4): qty 1

## 2016-11-25 MED ORDER — METOPROLOL TARTRATE 25 MG PO TABS
25.0000 mg | ORAL_TABLET | Freq: Two times a day (BID) | ORAL | Status: DC
  Administered 2016-11-25 – 2016-11-26 (×2): 25 mg via ORAL
  Filled 2016-11-25 (×2): qty 1

## 2016-11-25 MED ORDER — POLYETHYLENE GLYCOL 3350 17 G PO PACK
17.0000 g | PACK | Freq: Every day | ORAL | Status: DC
  Administered 2016-11-25 – 2016-11-26 (×2): 17 g via ORAL
  Filled 2016-11-25 (×2): qty 1

## 2016-11-25 NOTE — Progress Notes (Signed)
PROGRESS NOTE    John Barr  [id number] DOB: Nov 26, 1926 DOA: 11/22/2016 PCP: Wyatt Haste, MD   Brief Narrative: 83 year old male with history of hypertension, diabetes, CKD baseline creatinine 1.8, presents with multiple falls, workup significant for hematuria, worsening creatinine to 3.8 on admission, and not taking any medications over last few weeks as no PCP yet(his PCP retired in November, could not keep his appointment with [facility]).  Assessment & Plan:  #  Multiple falls - Likely physical deconditioning. 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  -CT scan of head consistent with mass lesion within the suprasellar cistern which may be neoplastic or vascular.MRI result reviewed, mass adenoma vs vascular lesion, recommended outpatient MRI possibly with contrast if kidney function improved. Also needs neurosurgery follow-up as an outpatient. I discussed this with patient's daughter at length today. She verbalized understanding. Patient will be discharged to rehabilitation from the hospital. Discussed with the social worker today. She will likely go tomorrow.  # AKI on CKD stage III vs progressive CKD. -Ultrasound of kidneys consistent with increased echogenicity consistent with chronic nature of kidney disease. -Serum creatinine level continues to improve. He has decent oral intake. I'll discontinue IV fluid.  # Abnormal prostate on ultrasound with microscopic hematuria - Never seen a urologist before,  PSA level: 1.28  # Hypertension - Continue amlodipine.Blood pressure suboptimally controlled. I'll increase the dose of metoprolol. Monitor heart rate and blood pressure.  # Diabetes mellitus mellitus; a1c 6.6. Supposed to be on metformin at home but he has not been taking. Continue sliding scale. Monitor blood sugar level.  # Anemia, mild iron deficiency anemia:  - Hemoglobin of 8.6 today -started oral iron.   Principal Problem:   AKI (acute kidney  injury) ([place]) Active Problems:   Diabetes ([place])   Hypertension associated with diabetes ([place])   Hyperlipidemia associated with type 2 diabetes mellitus ([place])   Frequent falls   Brain mass  DVT prophylaxis: Heparin subcutaneous Code Status: Full code Family Communication: Discussed with the patient daughter over the phone at length today. Disposition Plan: Likely discharge to rehabilitation tomorrow    Consultants:   None  Procedures: None Antimicrobials: None  Subjective: Patient was seen and examined at bedside. Reported doing well but still has weakness. Denied headache, dizziness, nausea vomiting chest pain or shortness of breath. Objective: Vitals:   11/24/16 1608 11/24/16 2021 11/25/16 0509 11/25/16 1000  BP: (!) 146/74 (!) 156/72 (!) 161/89 (!) 155/77  Pulse: 98 (!) 102 97 98  Resp: 16 18 17 18   Temp: 98.7 F (37.1 C) 99.6 F (37.6 C) 99.1 F (37.3 C) 98.9 F (37.2 C)  TempSrc: Oral   Oral  SpO2: 100% 100% 99% 99%  Weight:  91.6 kg (202 lb)    Height:        Intake/Output Summary (Last 24 hours) at 11/25/16 1641 Last data filed at 11/25/16 1500  Gross per 24 hour  Intake             4860 ml  Output              100 ml  Net             4760 ml   Filed Weights   11/22/16 2055 11/23/16 2048 11/24/16 2021  Weight: 85.5 kg (188 lb 7.9 oz) 86.4 kg (190 lb 8 oz) 91.6 kg (202 lb)    Examination:  General exam: Sitting on chair, not in distress. Respiratory system: clear bilateral, no  wheezing or crackle Cardiovascular system: Regular rate rhythm, S1-S2 normal. No murmur appreciated.  Gastrointestinal system: Abdomen is nondistended, soft and nontender. Normal bowel sounds heard. Central nervous system: Alert, awake and following commands. Extremities: no LE edema Skin: No rashes, lesions or ulcers   Data Reviewed: I have personally reviewed following labs and imaging studies  CBC:  Recent Labs Lab 11/22/16 1212 11/23/16 0618 11/24/16 0511  11/25/16 0646  WBC 9.5 9.2 12.3* 12.4*  HGB 9.2* 8.7* 8.9* 8.6*  HCT 28.5* 27.1* 27.3* 26.7*  MCV 89.3 89.7 89.2 89.0  PLT 243 239 258 [id number]   Basic Metabolic Panel:  Recent Labs Lab 11/22/16 1212 11/23/16 0618 11/24/16 0511 11/25/16 0646  NA 140 139 138 140  K 4.9 4.3 4.1 4.8  CL 108 109 108 112*  CO2 22 20* 20* 21*  GLUCOSE 145* 119* 141* 138*  BUN 58* 51* 51* 48*  CREATININE 3.76* 3.38* 3.28* 3.02*  CALCIUM 9.3 8.6* 8.8* 8.6*  PHOS  --   --   --  4.6   GFR: Estimated Creatinine Clearance: 18.9 mL/min (by C-G formula based on SCr of 3.02 mg/dL (H)). Liver Function Tests:  Recent Labs Lab 11/25/16 0646  ALBUMIN 3.1*   No results for input(s): LIPASE, AMYLASE in the last 168 hours. No results for input(s): AMMONIA in the last 168 hours. Coagulation Profile: No results for input(s): INR, PROTIME in the last 168 hours. Cardiac Enzymes: No results for input(s): CKTOTAL, CKMB, CKMBINDEX, TROPONINI in the last 168 hours. BNP (last 3 results) No results for input(s): PROBNP in the last 8760 hours. HbA1C:  Recent Labs  11/23/16 1542  HGBA1C 6.6*   CBG:  Recent Labs Lab 11/24/16 1650 11/24/16 2017 11/24/16 2058 11/25/16 1055 11/25/16 1312  GLUCAP 146* 145* 111* 150* 120*   Lipid Profile: No results for input(s): CHOL, HDL, LDLCALC, TRIG, CHOLHDL, LDLDIRECT in the last 72 hours. Thyroid Function Tests: No results for input(s): TSH, T4TOTAL, FREET4, T3FREE, THYROIDAB in the last 72 hours. Anemia Panel:  Recent Labs  11/23/16 1542 11/23/16 1543  VITAMINB12 806  --   FOLATE  --  31.6  FERRITIN 365*  --   TIBC 206*  --   IRON 19*  --   RETICCTPCT 1.2  --    Sepsis Labs: No results for input(s): PROCALCITON, LATICACIDVEN in the last 168 hours.  No results found for this or any previous visit (from the past 240 hour(s)).       Radiology Studies: Ct Head Wo Contrast  Result Date: 11/23/2016 CLINICAL DATA:  83 y/o  M; frequent falls at home.  EXAM: CT HEAD WITHOUT CONTRAST TECHNIQUE: Contiguous axial images were obtained from the base of the skull through the vertex without intravenous contrast. COMPARISON:  None. FINDINGS: Brain: No evidence of acute infarction, hemorrhage, hydrocephalus, extra-axial collection or focal mass effect. Nonspecific foci of hypoattenuation in subcortical and periventricular white matter is consistent with moderate chronic microvascular ischemic changes. Moderate brain parenchymal volume loss. Small lucencies in the basal ganglia bilaterally may represent prominent perivascular spaces or old lacunar infarcts. Mass lesion within the suprasellar cistern measuring 12 x 13 x 5 mm (coronal image 35 and sagittal image 35). Vascular: Extensive calcific atherosclerosis of cavernous and paraclinoid internal carotid arteries. Skull: Normal. Negative for fracture or focal lesion. Sinuses/Orbits: Right maxillary sinus mucous retention cyst. Otherwise negative. Other: None. IMPRESSION: 1. No acute intracranial abnormality. 2. Mass lesion within the suprasellar cistern which may be neoplastic or vascular. MRI of the  head with and without contrast and MRA of the head without contrast is recommended for further characterization. 3. Moderate chronic microvascular ischemic changes and parenchymal volume loss of the brain. These results will be called to the ordering clinician or representative by the Radiologist Assistant, and communication documented in the PACS or zVision Dashboard. Electronically Signed   By: Kristine Garbe M.D.   On: 11/23/2016 18:17   Mr Brain Wo Contrast  Result Date: 11/25/2016 CLINICAL DATA:  Suprasellar mass on CT.  Frequent falls. EXAM: MRI HEAD WITHOUT CONTRAST TECHNIQUE: Multiplanar, multiecho pulse sequences of the brain and surrounding structures were obtained without intravenous contrast. COMPARISON:  Head CT 11/23/2016 FINDINGS: The study is severely motion degraded. IV contrast was not  administered due to renal failure. Brain: There is no evidence of acute infarct, intracranial hemorrhage, midline shift, or extra-axial fluid collection. There is moderate cerebral atrophy. Periventricular white matter T2 hyperintensities are nonspecific but compatible with moderate chronic small vessel ischemic disease. As described on recent CT, there is a mass in the suprasellar cistern. Evaluation is extremely limited due to motion artifact, however this suprasellar mass appears to be contiguous with the abnormal soft tissue in the sella which appears slightly expanded. Overall, this sellar/suprasellar mass is estimated at 1.5 x 1.7 x 1.5 cm. Assessment for cavernous sinus involvement is limited. Vascular: Major intracranial vascular flow voids are preserved. Skull and upper cervical spine: The no gross osseous lesion. Sinuses/Orbits: Prior bilateral cataract extraction. Mild right maxillary sinus mucosal thickening. Clear mastoid air cells. Other: None. IMPRESSION: 1. Severely motion degraded examination without evidence of acute intracranial abnormality. 2. 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 Characterization is limited by motion and lack of IV contrast. Neoplasm such as pituitary macroadenoma is favored, with other tumors including meningioma and craniopharyngioma possible but less likely. A vascular etiology such as aneurysm is also considered much less likely. Pituitary protocol brain MRI as an outpatient is recommended when the patient's acute illness has resolved and he is better able to follow instructions and remain motionless. IV contrast would also be helpful at that time if his renal function improves. 3. Moderate chronic small vessel ischemic disease and cerebral atrophy. Electronically Signed   By: Logan Bores M.D.   On: 11/25/2016 10:40        Scheduled Meds: . amLODipine  10 mg Oral Daily  . docusate sodium  100 mg Oral BID  . ferrous sulfate  325 mg Oral BID WC  .  heparin  5,000 Units Subcutaneous q12n4p  . insulin aspart  0-9 Units Subcutaneous TID WC  . metoprolol tartrate  12.5 mg Oral BID  . polyethylene glycol  17 g Oral Daily  . sodium chloride flush  3 mL Intravenous Q12H   Continuous Infusions:    LOS: 2 days    Dron Tanna Furry, MD [facility] Pager [phone]  If 7PM-7AM, please contact night-coverage [email or web address] Password [facility] 11/25/2016, 4:41 PM

## 2016-11-25 NOTE — Progress Notes (Signed)
Physical Therapy Treatment Patient Details Name: Trinton Macfadyen MRN: [id number] DOB: 05-17-1927 Today's Date: 11/25/2016    History of Present Illness Patient is an 83 yo male admitted 11/22/16 with weakness and inability to walk.  Patient with AKI, weakness, HTN, and decreased cognition.    PMH:  falls, HTN, DM, CKD, arthritis, HLD, per chart - cognitive/memory issues not formally diagnosed    PT Comments    Pt significantly limited by knee pain R>L.  Emphasis on warm up exercise, bed mobility, standing trials for activity tolerance and transfers in this case without RW.  Follow Up Recommendations  SNF;Supervision/Assistance - 24 hour     Equipment Recommendations  Wheelchair (measurements PT);Wheelchair cushion (measurements PT)    Recommendations for Other Services       Precautions / Restrictions Precautions Precautions: Fall Precaution Comments: falls pta Restrictions Weight Bearing Restrictions: No    Mobility  Bed Mobility Overal bed mobility: Needs Assistance Bed Mobility: Supine to Sit;Sit to Supine     Supine to sit: Mod assist;+2 for safety/equipment;HOB elevated Sit to supine: Mod assist;+2 for physical assistance;HOB elevated   General bed mobility comments: assist to help scoot to EOB  Transfers Overall transfer level: Needs assistance Equipment used: Rolling walker (2 wheeled) Transfers: Sit to/from [employer] Sit to Stand: From elevated surface (x3)   Squat pivot transfers: Mod assist;+2 physical assistance     General transfer comment: cues for hand placement assist to come forward and up.  Stability assist to hold until pt can exert assistance   Ambulation/Gait             General Gait Details: Unable   Stairs            Wheelchair Mobility    Modified Rankin (Stroke Patients Only)       Balance Overall balance assessment: Needs assistance Sitting-balance support: Feet supported;Single extremity  supported;No upper extremity supported Sitting balance-Leahy Scale: Fair     Standing balance support: Bilateral upper extremity supported Standing balance-Leahy Scale: Zero Standing balance comment: stood x3 at EOB into RW with 2 person assist.  pt attained his normal height x2 with 10+* of extention lag bil and flexed posture.                    Cognition Arousal/Alertness: Awake/alert Behavior During Therapy: Flat affect;WFL for tasks assessed/performed Overall Cognitive Status:  (family stating they think he has some dememtia) Area of Impairment: Orientation;Attention;Memory;Following commands;Safety/judgement;Awareness;Problem solving Orientation Level: Situation Current Attention Level: Sustained Memory: Decreased short-term memory Following Commands: Follows one step commands inconsistently;Follows one step commands with increased time Safety/Judgement: Decreased awareness of safety;Decreased awareness of deficits Awareness: Intellectual Problem Solving: Slow processing;Requires verbal cues;Requires tactile cues      Exercises Other Exercises Other Exercises: warm up hip/knee ROM exercise active/resistive bil due to knee>hip pain    General Comments        Pertinent Vitals/Pain Pain Assessment: Faces Faces Pain Scale: Hurts even more Pain Location: R LE knee Pain Descriptors / Indicators: Moaning;Grimacing;Sharp;Sore Pain Intervention(s): Monitored during session;Repositioned    Home Living                      Prior Function            PT Goals (current goals can now be found in the care plan section) Acute Rehab PT Goals Patient Stated Goal: None stated PT Goal Formulation: Patient unable to participate in goal setting Time For Goal Achievement:  12/07/16 Potential to Achieve Goals: Fair Progress towards PT goals: Progressing toward goals    Frequency    Min 3X/week      PT Plan Current plan remains appropriate    Co-evaluation              End of Session   Activity Tolerance: Patient limited by fatigue;Patient limited by pain Patient left: in chair;with call bell/phone within reach;with chair alarm set;with family/visitor present     Time: [id number] PT Time Calculation (min) (ACUTE ONLY): 38 min  Charges:  $Therapeutic Exercise: 8-22 mins $Therapeutic Activity: 23-37 mins                    G CodesTessie Fass Sereen Schaff 11/25/2016, 12:35 PM 11/25/2016  Donnella Sham, [place] [phone]  (pager)

## 2016-11-25 NOTE — Progress Notes (Signed)
Physical Therapy Treatment Patient Details Name: John Barr MRN: [id number] DOB: 29-Dec-1926 Today's Date: 11/25/2016    History of Present Illness Patient is an 83 yo male admitted 11/22/16 with weakness and inability to walk.  Patient with AKI, weakness, HTN, and decreased cognition.    PMH:  falls, HTN, DM, CKD, arthritis, HLD, per chart - cognitive/memory issues not formally diagnosed    PT Comments    More difficult transfer due to stiffness, pain and a lower surface.  Attempt to moderate this with some ROM exercise prior to mobility.  Pt did scoot to EOB without assist and followed simple direction with cues well.   Follow Up Recommendations  SNF;Supervision/Assistance - 24 hour     Equipment Recommendations  Wheelchair (measurements PT);Wheelchair cushion (measurements PT)    Recommendations for Other Services       Precautions / Restrictions Precautions Precautions: Fall    Mobility  Bed Mobility Overal bed mobility: Needs Assistance Bed Mobility: Sit to Supine       Sit to supine: Mod assist;+2 for physical assistance;HOB elevated   General bed mobility comments: assist to help scoot to EOB  Transfers Overall transfer level: Needs assistance   Transfers: Squat Pivot Transfers     Squat pivot transfers: Max assist;+2 physical assistance     General transfer comment: pt stiff and sore from being in the chair.  Warm up ROM ex not enough to moderate the pain.  Ambulation/Gait                 Stairs            Wheelchair Mobility    Modified Rankin (Stroke Patients Only)       Balance Overall balance assessment: Needs assistance   Sitting balance-Leahy Scale: Fair                              Cognition Arousal/Alertness: Awake/alert Behavior During Therapy: Flat affect;WFL for tasks assessed/performed Overall Cognitive Status: No family/caregiver present to determine baseline cognitive functioning                       Exercises      General Comments        Pertinent Vitals/Pain Faces Pain Scale: Hurts even more Pain Location: R LE knee Pain Descriptors / Indicators: Moaning;Grimacing;Sharp;Sore Pain Intervention(s): Monitored during session;Repositioned    Home Living                      Prior Function            PT Goals (current goals can now be found in the care plan section) Acute Rehab PT Goals Patient Stated Goal: None stated PT Goal Formulation: Patient unable to participate in goal setting Time For Goal Achievement: 12/07/16 Potential to Achieve Goals: Fair    Frequency    Min 3X/week      PT Plan Current plan remains appropriate    Co-evaluation             End of Session   Activity Tolerance: Patient limited by fatigue;Patient limited by pain Patient left: in chair;with call bell/phone within reach;with chair alarm set;with family/visitor present     Time: 1727-1739 PT Time Calculation (min) (ACUTE ONLY): 12 min  Charges:  $Therapeutic Activity: 8-22 mins  G CodesTessie Fass Congetta Odriscoll 11/25/2016, 5:53 PM 11/25/2016  Donnella Sham, PT [phone]  (pager)

## 2016-11-25 NOTE — Clinical Social Work Note (Signed)
Clinical Social Worker continuing to follow patient and family for support and discharge planning needs.  CSW spoke with patient daughter over the phone to provide bed offers.  Patient daughter first choice was [facility], however they have declined.  Second choice was [facility].  CSW notified facility of bed offer acceptance and updated daughter on shared room status.  Patient daughter available by phone and states that patient will transport by [facility].  CSW remains available for support and to facilitate patient discharge needs once medically stable.  Barbette Or, [place]

## 2016-11-26 DIAGNOSIS — R296 Repeated falls: Secondary | ICD-10-CM | POA: Diagnosis not present

## 2016-11-26 DIAGNOSIS — N184 Chronic kidney disease, stage 4 (severe): Secondary | ICD-10-CM | POA: Diagnosis not present

## 2016-11-26 DIAGNOSIS — E1159 Type 2 diabetes mellitus with other circulatory complications: Secondary | ICD-10-CM | POA: Diagnosis not present

## 2016-11-26 DIAGNOSIS — D649 Anemia, unspecified: Secondary | ICD-10-CM | POA: Diagnosis not present

## 2016-11-26 DIAGNOSIS — I10 Essential (primary) hypertension: Secondary | ICD-10-CM | POA: Diagnosis not present

## 2016-11-26 DIAGNOSIS — R410 Disorientation, unspecified: Secondary | ICD-10-CM | POA: Diagnosis not present

## 2016-11-26 DIAGNOSIS — E119 Type 2 diabetes mellitus without complications: Secondary | ICD-10-CM | POA: Diagnosis not present

## 2016-11-26 DIAGNOSIS — G939 Disorder of brain, unspecified: Secondary | ICD-10-CM | POA: Diagnosis not present

## 2016-11-26 DIAGNOSIS — M6281 Muscle weakness (generalized): Secondary | ICD-10-CM | POA: Diagnosis not present

## 2016-11-26 DIAGNOSIS — N400 Enlarged prostate without lower urinary tract symptoms: Secondary | ICD-10-CM | POA: Diagnosis not present

## 2016-11-26 DIAGNOSIS — R259 Unspecified abnormal involuntary movements: Secondary | ICD-10-CM | POA: Diagnosis not present

## 2016-11-26 DIAGNOSIS — N189 Chronic kidney disease, unspecified: Secondary | ICD-10-CM | POA: Diagnosis not present

## 2016-11-26 DIAGNOSIS — R2681 Unsteadiness on feet: Secondary | ICD-10-CM | POA: Diagnosis not present

## 2016-11-26 DIAGNOSIS — E785 Hyperlipidemia, unspecified: Secondary | ICD-10-CM | POA: Diagnosis not present

## 2016-11-26 DIAGNOSIS — N179 Acute kidney failure, unspecified: Secondary | ICD-10-CM | POA: Diagnosis not present

## 2016-11-26 DIAGNOSIS — M25561 Pain in right knee: Secondary | ICD-10-CM | POA: Diagnosis not present

## 2016-11-26 DIAGNOSIS — N183 Chronic kidney disease, stage 3 (moderate): Secondary | ICD-10-CM | POA: Diagnosis not present

## 2016-11-26 DIAGNOSIS — R278 Other lack of coordination: Secondary | ICD-10-CM | POA: Diagnosis not present

## 2016-11-26 DIAGNOSIS — D509 Iron deficiency anemia, unspecified: Secondary | ICD-10-CM | POA: Diagnosis not present

## 2016-11-26 LAB — RENAL FUNCTION PANEL
Albumin: 2.8 g/dL — ABNORMAL LOW (ref 3.5–5.0)
Anion gap: 9 (ref 5–15)
BUN: 56 mg/dL — AB (ref 6–20)
CO2: 20 mmol/L — ABNORMAL LOW (ref 22–32)
CREATININE: 3.19 mg/dL — AB (ref 0.61–1.24)
Calcium: 8.4 mg/dL — ABNORMAL LOW (ref 8.9–10.3)
Chloride: 108 mmol/L (ref 101–111)
GFR calc Af Amer: 19 mL/min — ABNORMAL LOW (ref >60)
GFR, EST NON AFRICAN AMERICAN: 16 mL/min — AB (ref >60)
Glucose, Bld: 107 mg/dL — ABNORMAL HIGH (ref 65–99)
PHOSPHORUS: 4.9 mg/dL — AB (ref 2.5–4.6)
POTASSIUM: 4.6 mmol/L (ref 3.5–5.1)
Sodium: 137 mmol/L (ref 135–145)

## 2016-11-26 LAB — GLUCOSE, CAPILLARY
GLUCOSE-CAPILLARY: 98 mg/dL (ref 65–99)
GLUCOSE-CAPILLARY: 105 mg/dL — AB (ref 65–99)
Glucose-Capillary: 124 mg/dL — ABNORMAL HIGH (ref 65–99)

## 2016-11-26 MED ORDER — INSULIN ASPART 100 UNIT/ML ~~LOC~~ SOLN: 0.0000 [IU] | Freq: Three times a day (TID) | SUBCUTANEOUS | 11 refills | Status: AC

## 2016-11-26 MED ORDER — METOPROLOL TARTRATE 25 MG PO TABS: 25.0000 mg | ORAL_TABLET | Freq: Two times a day (BID) | ORAL | 0 refills | Status: DC

## 2016-11-26 MED ORDER — FERROUS SULFATE 325 (65 FE) MG PO TABS: 325.0000 mg | ORAL_TABLET | Freq: Two times a day (BID) | ORAL | 0 refills | Status: AC

## 2016-11-26 MED ORDER — AMLODIPINE BESYLATE 10 MG PO TABS: 10.0000 mg | ORAL_TABLET | Freq: Every day | ORAL | 0 refills | Status: DC

## 2016-11-26 NOTE — Discharge Summary (Signed)
Physician Discharge Summary  Render John Barr DOB: 11-11-1926 DOA: 11/22/2016  PCP: Wyatt Haste, MD  Admit date: 11/22/2016 Discharge date: 11/26/2016  Admitted From:Home Disposition:SNF  Recommendations for Outpatient Follow-up:  1. Follow up with PCP in 1-2 weeks 2. Please obtain BMP/CBC in one week  Home Health:SNF Equipment/Devices:none Discharge Condition:stable CODE STATUS:full Diet recommendation:Carb modified heart healthy diet.  Brief/Interim Summary:83 year old male with history of hypertension, diabetes, CKD baseline creatinine 1.8, presents with multiple falls, workup significant for hematuria, worsening creatinine to 3.8 on admission, and not taking any medications over last few weeks.  #  Multiple falls - Likely physical deconditioning. 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  -CT scan of head consistent with mass lesion within the suprasellar cistern which may be neoplastic or vascular. MRI result reviewed, mass adenoma vs vascular lesion, recommended outpatient MRI possibly with contrast if kidney function improved. Also needs neurosurgery follow-up as an outpatient. I discussed this with patient's daughter at length on 11/25/2016. She verbalized understanding. Patient will be discharged to rehabilitation from the hospital. Discussed with the social worker today.  -Patient is a stable to rehabilitation today with outpatient follow-up. Outpatient referral to neurosurgery was requested.  # AKI on CKD stage III vs progressive CKD.  -Ultrasound of kidneys consistent with increased echogenicity consistent with chronic nature of kidney disease. -Serum creatinine level stable. I advised to follow-up with her nephrologist outpatient. Discussed this with the patient's daughter.  # Abnormal prostate onultrasound with microscopic hematuria - PSA level: 1.28  # Hypertension - Continue amlodipine and metoprolol.Continue to monitor blood  pressure.  # Diabetes mellitus mellitus; a1c 6.6. Supposed to be on metformin at home but he has not been taking. Continue sliding scale. Monitor blood sugar level.  # Anemia, mild iron deficiency anemia:  - Stable hemoglobin. Continue oral iron.   Patient is medically stable. Today he is in room air. Able to tolerate diet well. Denied any new symptoms. Needs outpatient follow-up with PCP, neurosurgery and possibly with nephrologist.  Discharge Diagnoses:  Principal Problem:   AKI (acute kidney injury) ([place]) Active Problems:   Diabetes ([place])   Hypertension associated with diabetes ([place])   Hyperlipidemia associated with type 2 diabetes mellitus ([place])   Frequent falls   Brain mass    Discharge Instructions  Discharge Instructions    Ambulatory referral to Neurosurgery    Complete by:  As directed    For brain mass.   Call MD for:  difficulty breathing, headache or visual disturbances    Complete by:  As directed    Call MD for:  extreme fatigue    Complete by:  As directed    Call MD for:  hives    Complete by:  As directed    Call MD for:  persistant dizziness or light-headedness    Complete by:  As directed    Call MD for:  persistant nausea and vomiting    Complete by:  As directed    Call MD for:  severe uncontrolled pain    Complete by:  As directed    Call MD for:  temperature >100.4    Complete by:  As directed    Diet - low sodium heart healthy    Complete by:  As directed    Diet Carb Modified    Complete by:  As directed    Discharge instructions    Complete by:  As directed    Please follow up with neurosurgery for the evaluation  of brain mass.   Increase activity slowly    Complete by:  As directed      Allergies as of 11/26/2016   No Known Allergies     Medication List    STOP taking these medications   benazepril-hydrochlorthiazide 20-12.5 MG tablet Commonly known as:  LOTENSIN HCT   hydrochlorothiazide 25 MG tablet Commonly known as:   HYDRODIURIL   metFORMIN 500 MG tablet Commonly known as:  GLUCOPHAGE     TAKE these medications   acetaminophen 500 MG tablet Commonly known as:  TYLENOL Take 1,000 mg by mouth every 6 (six) hours as needed for moderate pain or headache.   amLODipine 10 MG tablet Commonly known as:  NORVASC Take 1 tablet (10 mg total) by mouth daily. Start taking on:  11/27/2016   ferrous sulfate 325 (65 FE) MG tablet Take 1 tablet (325 mg total) by mouth 2 (two) times daily with a meal.   insulin aspart 100 UNIT/ML injection Commonly known as:  novoLOG Inject 0-9 Units into the skin 3 (three) times daily with meals.   metoprolol tartrate 25 MG tablet Commonly known as:  LOPRESSOR Take 1 tablet (25 mg total) by mouth 2 (two) times daily.   omeprazole 20 MG capsule Commonly known as:  PRILOSEC TAKE 1 CAPSULE (20 MG TOTAL) BY MOUTH DAILY. What changed:  Another medication with the same name was removed. Continue taking this medication, and follow the directions you see here.   simvastatin 40 MG tablet Commonly known as:  ZOCOR Take 1 tablet (40 mg total) by mouth every evening.      Follow-up Information    Wyatt Haste, MD. Schedule an appointment as soon as possible for a visit in 1 week(s).   Specialty:  Family Medicine Contact information: [address] [place] [address] [phone]          No Known Allergies  Consultations: None  Procedures/Studies: CT head and MRI  Subjective: Patient was seen and examined at bedside. Patient reported feeling much better. Denied headache, dizziness, nausea, chest pressure shortness of breath. Eating well   Discharge Exam: Vitals:   11/26/16 0611 11/26/16 0951  BP: (!) 145/67 136/68  Pulse: 75 83  Resp: 18 16  Temp: 98.2 F (36.8 C) 97.8 F (36.6 C)   Vitals:   11/25/16 1813 11/25/16 2054 11/26/16 0611 11/26/16 0951  BP: (!) 157/79 120/68 (!) 145/67 136/68  Pulse: 100 86 75 83  Resp: 18 18 18 16    Temp: 99.1 F (37.3 C) 98 F (36.7 C) 98.2 F (36.8 C) 97.8 F (36.6 C)  TempSrc: Oral Oral Oral Oral  SpO2: 99% 100% 100% 99%  Weight:  88.3 kg (194 lb 10.7 oz)    Height:        General: Pt is alert, awake, not in acute distress Cardiovascular: RRR, S1/S2 +, no rubs, no gallops Respiratory: CTA bilaterally, no wheezing, no rhonchi Abdominal: Soft, NT, ND, bowel sounds + Extremities: no edema, no cyanosis Neurology, alert, awake, following commands.   The results of significant diagnostics from this hospitalization (including imaging, microbiology, ancillary and laboratory) are listed below for reference.     Microbiology: No results found for this or any previous visit (from the past 240 hour(s)).   Labs: BNP (last 3 results) No results for input(s): BNP in the last 8760 hours. Basic Metabolic Panel:  Recent Labs Lab 11/22/16 1212 11/23/16 0618 11/24/16 0511 11/25/16 0646 11/26/16 0618  NA 140 139 138 140 137  K 4.9 4.3 4.1 4.8 4.6  CL 108 109 108 112* 108  CO2 22 20* 20* 21* 20*  GLUCOSE 145* 119* 141* 138* 107*  BUN 58* 51* 51* 48* 56*  CREATININE 3.76* 3.38* 3.28* 3.02* 3.19*  CALCIUM 9.3 8.6* 8.8* 8.6* 8.4*  PHOS  --   --   --  4.6 4.9*   Liver Function Tests:  Recent Labs Lab 11/25/16 0646 11/26/16 0618  ALBUMIN 3.1* 2.8*   No results for input(s): LIPASE, AMYLASE in the last 168 hours. No results for input(s): AMMONIA in the last 168 hours. CBC:  Recent Labs Lab 11/22/16 1212 11/23/16 0618 11/24/16 0511 11/25/16 0646  WBC 9.5 9.2 12.3* 12.4*  HGB 9.2* 8.7* 8.9* 8.6*  HCT 28.5* 27.1* 27.3* 26.7*  MCV 89.3 89.7 89.2 89.0  PLT 243 239 258 253   Cardiac Enzymes: No results for input(s): CKTOTAL, CKMB, CKMBINDEX, TROPONINI in the last 168 hours. BNP: Invalid input(s): POCBNP CBG:  Recent Labs Lab 11/25/16 1312 11/25/16 1654 11/25/16 2054 11/26/16 0739 11/26/16 1113  GLUCAP 120* 130* 108* 98 124*   D-Dimer No results for  input(s): DDIMER in the last 72 hours. Hgb A1c  Recent Labs  11/23/16 1542  HGBA1C 6.6*   Lipid Profile No results for input(s): CHOL, HDL, LDLCALC, TRIG, CHOLHDL, LDLDIRECT in the last 72 hours. Thyroid function studies No results for input(s): TSH, T4TOTAL, T3FREE, THYROIDAB in the last 72 hours.  Invalid input(s): FREET3 Anemia work up  Recent Labs  11/23/16 1542 11/23/16 1543  VITAMINB12 806  --   FOLATE  --  31.6  FERRITIN 365*  --   TIBC 206*  --   IRON 19*  --   RETICCTPCT 1.2  --    Urinalysis    Component Value Date/Time   COLORURINE STRAW (A) 11/22/2016 1630   APPEARANCEUR CLEAR 11/22/2016 1630   LABSPEC 1.012 11/22/2016 1630   PHURINE 6.0 11/22/2016 1630   GLUCOSEU NEGATIVE 11/22/2016 1630   HGBUR MODERATE (A) 11/22/2016 1630   BILIRUBINUR NEGATIVE 11/22/2016 1630   BILIRUBINUR neg 05/17/2014 1426   KETONESUR NEGATIVE 11/22/2016 1630   PROTEINUR 100 (A) 11/22/2016 1630   UROBILINOGEN negative 05/17/2014 1426   NITRITE NEGATIVE 11/22/2016 1630   LEUKOCYTESUR NEGATIVE 11/22/2016 1630   Sepsis Labs Invalid input(s): PROCALCITONIN,  WBC,  LACTICIDVEN Microbiology No results found for this or any previous visit (from the past 240 hour(s)).   Time coordinating discharge: 26 minutes  SIGNED:   Rosita Fire, MD  [facility] 11/26/2016, 12:53 PM  If 7PM-7AM, please contact night-coverage [email or web address] Password [email or web address]

## 2016-11-26 NOTE — Care Management Note (Signed)
Case Management Note  Patient Details  Name: Cloys Cort MRN: [id number] Date of Birth: June 02, 1927  Subjective/Objective:                 Patient admitted with AKI.   Action/Plan:  Will DC to SNF as facilitated by CSW today.  Expected Discharge Date:  11/26/16               Expected Discharge Plan:  Skilled Nursing Facility  In-House Referral:  Clinical Social Work  Discharge planning Services  CM Consult  Post Acute Care Choice:    Choice offered to:     DME Arranged:    DME Agency:     HH Arranged:    [place] Agency:     Status of Service:  Completed, signed off  If discussed at [employer], dates discussed:    Additional Comments:  Carles Collet, RN 11/26/2016, 12:59 PM

## 2016-11-26 NOTE — Clinical Social Work Placement (Signed)
   CLINICAL SOCIAL WORK PLACEMENT  NOTE 11/26/16 - DISCHARGED TO [facility]   Date:  11/26/2016  Patient Details  Name: John Barr MRN: [id number] Date of Birth: 1927/08/26  Clinical Social Work is seeking post-discharge placement for this patient at the [place] level of care (*CSW will initial, date and re-position this form in  chart as items are completed):  Yes   Patient/family provided with [facility] Work Department's list of facilities offering this level of care within the geographic area requested by the patient (or if unable, by the patient's family).  Yes   Patient/family informed of their freedom to choose among providers that offer the needed level of care, that participate in Medicare, Medicaid or managed care program needed by the patient, have an available bed and are willing to accept the patient.  Yes   Patient/family informed of [facility]'s ownership interest in [facility] and [facility], as well as of the fact that they are under no obligation to receive care at these facilities.  PASRR submitted to EDS on 11/24/16     PASRR number received on 11/24/16     Existing PASRR number confirmed on       FL2 transmitted to all facilities in geographic area requested by pt/family on 11/24/16     FL2 transmitted to all facilities within larger geographic area on       Patient informed that his/her managed care company has contracts with or will negotiate with certain facilities, including the following:         11/26/16 - Patient/family informed of bed offers received.  Patient's daughter chooses bed at  [facility]     Physician recommends and patient chooses bed at      Patient to be transferred to  [facility] on  11/26/16.  Patient to be transferred to facility by  ambulance     Patient's family notified on  11/26/16 of transfer.  Name of family member notified: Daughter, Kerrie Murie [phone])        PHYSICIAN Please sign FL2     Additional Comment:    _______________________________________________ Sable Feil, LCSW 11/26/2016, 10:25 AM

## 2016-11-26 NOTE — Progress Notes (Signed)
Patients IV came out. MD notified. No further orders. Will continue to monitor.

## 2016-11-26 NOTE — Progress Notes (Signed)
Report called and given to Cristie Hem, RN at [facility].

## 2016-11-26 NOTE — Consult Note (Signed)
[facility] CM Primary Care Navigator  11/26/2016  John Barr Mar 01, 1927 [id number]  Met with patient at the bedside to identify possible discharge needs. Per MD note, patient had worsening weakness and had been staying in bed more. His daughter was unable to get him out of bed so she called EMS and brought patient to the ED for further evaluation which led to this admission.   Patient confirms Dr. Jill Alexanders with [facility] as the primary care provider.    Patient shared using [employer] (being a "disabled [employer]"), [employer] Delivery service to obtain medications.   Patient states that daughter Adonis Huguenin) and wife (Daphine) assist him in managing his medications at home.   He mentioned being able to drive, but daughter provides transportation to his doctors' appointments as well as his wife's dialysis (Tues-Thurs-Sat).  Daughter is the primary caregiver at home and the "sole keeper of health affairs" as stated by patient.  Discharge plan is skilled nursing facility ([facility]) once medically stable.  Patient expressed understanding to contact primary care provider's office when he returns home, for a post discharge follow-up appointment within a week or sooner if needed. Patient letter (with PCP's contact number) was given to patient as a reminder.  Explained to patient about [facility] CM services available for disease management (DM) but he strongly declined and states "I am not stupid, I know that". [facility] care management contact information provided for future needs that may arise.  For additional questions please contact:  Edwena Felty A. Rehmat Murtagh, BSN, RN-BC [facility] PRIMARY CARE Navigator Cell: [phone]

## 2016-11-27 ENCOUNTER — Telehealth: Payer: Self-pay | Admitting: Family Medicine

## 2016-11-27 DIAGNOSIS — D509 Iron deficiency anemia, unspecified: Secondary | ICD-10-CM | POA: Diagnosis not present

## 2016-11-27 DIAGNOSIS — N400 Enlarged prostate without lower urinary tract symptoms: Secondary | ICD-10-CM | POA: Diagnosis not present

## 2016-11-27 DIAGNOSIS — I10 Essential (primary) hypertension: Secondary | ICD-10-CM | POA: Diagnosis not present

## 2016-11-27 DIAGNOSIS — G939 Disorder of brain, unspecified: Secondary | ICD-10-CM | POA: Diagnosis not present

## 2016-11-27 DIAGNOSIS — E119 Type 2 diabetes mellitus without complications: Secondary | ICD-10-CM | POA: Diagnosis not present

## 2016-11-27 NOTE — Telephone Encounter (Signed)
Called and spoke to pt's daughter concerning pt's recent hospital stay. She states that pt is now in skilled nursing at [facility].

## 2016-12-13 DIAGNOSIS — N184 Chronic kidney disease, stage 4 (severe): Secondary | ICD-10-CM | POA: Diagnosis not present

## 2016-12-13 DIAGNOSIS — D649 Anemia, unspecified: Secondary | ICD-10-CM | POA: Diagnosis not present

## 2016-12-25 DIAGNOSIS — N179 Acute kidney failure, unspecified: Secondary | ICD-10-CM | POA: Diagnosis not present

## 2016-12-25 DIAGNOSIS — M25562 Pain in left knee: Secondary | ICD-10-CM | POA: Diagnosis not present

## 2016-12-25 DIAGNOSIS — R278 Other lack of coordination: Secondary | ICD-10-CM | POA: Diagnosis not present

## 2016-12-25 DIAGNOSIS — R2681 Unsteadiness on feet: Secondary | ICD-10-CM | POA: Diagnosis not present

## 2016-12-25 DIAGNOSIS — M25561 Pain in right knee: Secondary | ICD-10-CM | POA: Diagnosis not present

## 2016-12-25 DIAGNOSIS — G939 Disorder of brain, unspecified: Secondary | ICD-10-CM | POA: Diagnosis not present

## 2016-12-26 DIAGNOSIS — R2681 Unsteadiness on feet: Secondary | ICD-10-CM | POA: Diagnosis not present

## 2016-12-26 DIAGNOSIS — M25562 Pain in left knee: Secondary | ICD-10-CM | POA: Diagnosis not present

## 2016-12-26 DIAGNOSIS — N179 Acute kidney failure, unspecified: Secondary | ICD-10-CM | POA: Diagnosis not present

## 2016-12-26 DIAGNOSIS — R278 Other lack of coordination: Secondary | ICD-10-CM | POA: Diagnosis not present

## 2016-12-26 DIAGNOSIS — M25561 Pain in right knee: Secondary | ICD-10-CM | POA: Diagnosis not present

## 2016-12-26 DIAGNOSIS — G939 Disorder of brain, unspecified: Secondary | ICD-10-CM | POA: Diagnosis not present

## 2016-12-27 DIAGNOSIS — R2681 Unsteadiness on feet: Secondary | ICD-10-CM | POA: Diagnosis not present

## 2016-12-27 DIAGNOSIS — G939 Disorder of brain, unspecified: Secondary | ICD-10-CM | POA: Diagnosis not present

## 2016-12-27 DIAGNOSIS — M25561 Pain in right knee: Secondary | ICD-10-CM | POA: Diagnosis not present

## 2016-12-27 DIAGNOSIS — R278 Other lack of coordination: Secondary | ICD-10-CM | POA: Diagnosis not present

## 2016-12-27 DIAGNOSIS — N179 Acute kidney failure, unspecified: Secondary | ICD-10-CM | POA: Diagnosis not present

## 2016-12-27 DIAGNOSIS — M25562 Pain in left knee: Secondary | ICD-10-CM | POA: Diagnosis not present

## 2016-12-29 DIAGNOSIS — N179 Acute kidney failure, unspecified: Secondary | ICD-10-CM | POA: Diagnosis not present

## 2016-12-29 DIAGNOSIS — E119 Type 2 diabetes mellitus without complications: Secondary | ICD-10-CM | POA: Diagnosis not present

## 2016-12-29 DIAGNOSIS — M25561 Pain in right knee: Secondary | ICD-10-CM | POA: Diagnosis not present

## 2016-12-29 DIAGNOSIS — G939 Disorder of brain, unspecified: Secondary | ICD-10-CM | POA: Diagnosis not present

## 2016-12-29 DIAGNOSIS — I10 Essential (primary) hypertension: Secondary | ICD-10-CM | POA: Diagnosis not present

## 2016-12-29 DIAGNOSIS — M25562 Pain in left knee: Secondary | ICD-10-CM | POA: Diagnosis not present

## 2016-12-29 DIAGNOSIS — R2681 Unsteadiness on feet: Secondary | ICD-10-CM | POA: Diagnosis not present

## 2016-12-29 DIAGNOSIS — Z79899 Other long term (current) drug therapy: Secondary | ICD-10-CM | POA: Diagnosis not present

## 2016-12-29 DIAGNOSIS — R278 Other lack of coordination: Secondary | ICD-10-CM | POA: Diagnosis not present

## 2016-12-30 DIAGNOSIS — N400 Enlarged prostate without lower urinary tract symptoms: Secondary | ICD-10-CM | POA: Diagnosis not present

## 2016-12-30 DIAGNOSIS — I10 Essential (primary) hypertension: Secondary | ICD-10-CM | POA: Diagnosis not present

## 2016-12-30 DIAGNOSIS — D509 Iron deficiency anemia, unspecified: Secondary | ICD-10-CM | POA: Diagnosis not present

## 2016-12-30 DIAGNOSIS — E119 Type 2 diabetes mellitus without complications: Secondary | ICD-10-CM | POA: Diagnosis not present

## 2016-12-30 DIAGNOSIS — G939 Disorder of brain, unspecified: Secondary | ICD-10-CM | POA: Diagnosis not present

## 2017-01-01 ENCOUNTER — Telehealth: Payer: Self-pay | Admitting: Family Medicine

## 2017-01-01 NOTE — Telephone Encounter (Signed)
The skin information from them and find out what we need to do to follow-up

## 2017-01-01 NOTE — Telephone Encounter (Signed)
Kisa with [facility] called and advised PT will be going out tomorrow from [employer]. IEPP [phone]

## 2017-01-02 DIAGNOSIS — M25561 Pain in right knee: Secondary | ICD-10-CM | POA: Diagnosis not present

## 2017-01-02 DIAGNOSIS — N184 Chronic kidney disease, stage 4 (severe): Secondary | ICD-10-CM | POA: Diagnosis not present

## 2017-01-02 DIAGNOSIS — M25562 Pain in left knee: Secondary | ICD-10-CM | POA: Diagnosis not present

## 2017-01-02 DIAGNOSIS — Z794 Long term (current) use of insulin: Secondary | ICD-10-CM | POA: Diagnosis not present

## 2017-01-02 DIAGNOSIS — I129 Hypertensive chronic kidney disease with stage 1 through stage 4 chronic kidney disease, or unspecified chronic kidney disease: Secondary | ICD-10-CM | POA: Diagnosis not present

## 2017-01-02 DIAGNOSIS — E1122 Type 2 diabetes mellitus with diabetic chronic kidney disease: Secondary | ICD-10-CM | POA: Diagnosis not present

## 2017-01-02 DIAGNOSIS — Z9181 History of falling: Secondary | ICD-10-CM | POA: Diagnosis not present

## 2017-01-02 DIAGNOSIS — D509 Iron deficiency anemia, unspecified: Secondary | ICD-10-CM | POA: Diagnosis not present

## 2017-01-02 DIAGNOSIS — G939 Disorder of brain, unspecified: Secondary | ICD-10-CM | POA: Diagnosis not present

## 2017-01-02 DIAGNOSIS — N179 Acute kidney failure, unspecified: Secondary | ICD-10-CM | POA: Diagnosis not present

## 2017-01-07 DIAGNOSIS — M25561 Pain in right knee: Secondary | ICD-10-CM | POA: Diagnosis not present

## 2017-01-07 DIAGNOSIS — M25562 Pain in left knee: Secondary | ICD-10-CM | POA: Diagnosis not present

## 2017-01-07 DIAGNOSIS — Z9181 History of falling: Secondary | ICD-10-CM | POA: Diagnosis not present

## 2017-01-07 DIAGNOSIS — D509 Iron deficiency anemia, unspecified: Secondary | ICD-10-CM | POA: Diagnosis not present

## 2017-01-07 DIAGNOSIS — I129 Hypertensive chronic kidney disease with stage 1 through stage 4 chronic kidney disease, or unspecified chronic kidney disease: Secondary | ICD-10-CM | POA: Diagnosis not present

## 2017-01-07 DIAGNOSIS — Z794 Long term (current) use of insulin: Secondary | ICD-10-CM | POA: Diagnosis not present

## 2017-01-07 DIAGNOSIS — N184 Chronic kidney disease, stage 4 (severe): Secondary | ICD-10-CM | POA: Diagnosis not present

## 2017-01-07 DIAGNOSIS — E1122 Type 2 diabetes mellitus with diabetic chronic kidney disease: Secondary | ICD-10-CM | POA: Diagnosis not present

## 2017-01-07 DIAGNOSIS — G939 Disorder of brain, unspecified: Secondary | ICD-10-CM | POA: Diagnosis not present

## 2017-01-07 DIAGNOSIS — N179 Acute kidney failure, unspecified: Secondary | ICD-10-CM | POA: Diagnosis not present

## 2017-01-08 NOTE — Telephone Encounter (Signed)
Patient said he no longer see's Dr.lalonde

## 2017-01-09 DIAGNOSIS — N179 Acute kidney failure, unspecified: Secondary | ICD-10-CM | POA: Diagnosis not present

## 2017-01-09 DIAGNOSIS — E1122 Type 2 diabetes mellitus with diabetic chronic kidney disease: Secondary | ICD-10-CM | POA: Diagnosis not present

## 2017-01-09 DIAGNOSIS — I129 Hypertensive chronic kidney disease with stage 1 through stage 4 chronic kidney disease, or unspecified chronic kidney disease: Secondary | ICD-10-CM | POA: Diagnosis not present

## 2017-01-09 DIAGNOSIS — M25562 Pain in left knee: Secondary | ICD-10-CM | POA: Diagnosis not present

## 2017-01-09 DIAGNOSIS — G939 Disorder of brain, unspecified: Secondary | ICD-10-CM | POA: Diagnosis not present

## 2017-01-09 DIAGNOSIS — Z9181 History of falling: Secondary | ICD-10-CM | POA: Diagnosis not present

## 2017-01-09 DIAGNOSIS — M25561 Pain in right knee: Secondary | ICD-10-CM | POA: Diagnosis not present

## 2017-01-09 DIAGNOSIS — N184 Chronic kidney disease, stage 4 (severe): Secondary | ICD-10-CM | POA: Diagnosis not present

## 2017-01-09 DIAGNOSIS — D509 Iron deficiency anemia, unspecified: Secondary | ICD-10-CM | POA: Diagnosis not present

## 2017-01-09 DIAGNOSIS — Z794 Long term (current) use of insulin: Secondary | ICD-10-CM | POA: Diagnosis not present

## 2017-01-12 DIAGNOSIS — M25562 Pain in left knee: Secondary | ICD-10-CM | POA: Diagnosis not present

## 2017-01-12 DIAGNOSIS — Z794 Long term (current) use of insulin: Secondary | ICD-10-CM | POA: Diagnosis not present

## 2017-01-12 DIAGNOSIS — N179 Acute kidney failure, unspecified: Secondary | ICD-10-CM | POA: Diagnosis not present

## 2017-01-12 DIAGNOSIS — M25561 Pain in right knee: Secondary | ICD-10-CM | POA: Diagnosis not present

## 2017-01-12 DIAGNOSIS — G939 Disorder of brain, unspecified: Secondary | ICD-10-CM | POA: Diagnosis not present

## 2017-01-12 DIAGNOSIS — D509 Iron deficiency anemia, unspecified: Secondary | ICD-10-CM | POA: Diagnosis not present

## 2017-01-12 DIAGNOSIS — E1122 Type 2 diabetes mellitus with diabetic chronic kidney disease: Secondary | ICD-10-CM | POA: Diagnosis not present

## 2017-01-12 DIAGNOSIS — N184 Chronic kidney disease, stage 4 (severe): Secondary | ICD-10-CM | POA: Diagnosis not present

## 2017-01-12 DIAGNOSIS — Z9181 History of falling: Secondary | ICD-10-CM | POA: Diagnosis not present

## 2017-01-12 DIAGNOSIS — I129 Hypertensive chronic kidney disease with stage 1 through stage 4 chronic kidney disease, or unspecified chronic kidney disease: Secondary | ICD-10-CM | POA: Diagnosis not present

## 2017-01-13 DIAGNOSIS — D509 Iron deficiency anemia, unspecified: Secondary | ICD-10-CM | POA: Diagnosis not present

## 2017-01-13 DIAGNOSIS — G939 Disorder of brain, unspecified: Secondary | ICD-10-CM | POA: Diagnosis not present

## 2017-01-13 DIAGNOSIS — I129 Hypertensive chronic kidney disease with stage 1 through stage 4 chronic kidney disease, or unspecified chronic kidney disease: Secondary | ICD-10-CM | POA: Diagnosis not present

## 2017-01-13 DIAGNOSIS — N184 Chronic kidney disease, stage 4 (severe): Secondary | ICD-10-CM | POA: Diagnosis not present

## 2017-01-13 DIAGNOSIS — Z794 Long term (current) use of insulin: Secondary | ICD-10-CM | POA: Diagnosis not present

## 2017-01-13 DIAGNOSIS — M25561 Pain in right knee: Secondary | ICD-10-CM | POA: Diagnosis not present

## 2017-01-13 DIAGNOSIS — E1122 Type 2 diabetes mellitus with diabetic chronic kidney disease: Secondary | ICD-10-CM | POA: Diagnosis not present

## 2017-01-13 DIAGNOSIS — Z9181 History of falling: Secondary | ICD-10-CM | POA: Diagnosis not present

## 2017-01-13 DIAGNOSIS — N179 Acute kidney failure, unspecified: Secondary | ICD-10-CM | POA: Diagnosis not present

## 2017-01-13 DIAGNOSIS — M25562 Pain in left knee: Secondary | ICD-10-CM | POA: Diagnosis not present

## 2017-01-14 DIAGNOSIS — Z9181 History of falling: Secondary | ICD-10-CM | POA: Diagnosis not present

## 2017-01-14 DIAGNOSIS — E1122 Type 2 diabetes mellitus with diabetic chronic kidney disease: Secondary | ICD-10-CM | POA: Diagnosis not present

## 2017-01-14 DIAGNOSIS — M25561 Pain in right knee: Secondary | ICD-10-CM | POA: Diagnosis not present

## 2017-01-14 DIAGNOSIS — M25562 Pain in left knee: Secondary | ICD-10-CM | POA: Diagnosis not present

## 2017-01-14 DIAGNOSIS — D509 Iron deficiency anemia, unspecified: Secondary | ICD-10-CM | POA: Diagnosis not present

## 2017-01-14 DIAGNOSIS — N179 Acute kidney failure, unspecified: Secondary | ICD-10-CM | POA: Diagnosis not present

## 2017-01-14 DIAGNOSIS — I129 Hypertensive chronic kidney disease with stage 1 through stage 4 chronic kidney disease, or unspecified chronic kidney disease: Secondary | ICD-10-CM | POA: Diagnosis not present

## 2017-01-14 DIAGNOSIS — N184 Chronic kidney disease, stage 4 (severe): Secondary | ICD-10-CM | POA: Diagnosis not present

## 2017-01-14 DIAGNOSIS — G939 Disorder of brain, unspecified: Secondary | ICD-10-CM | POA: Diagnosis not present

## 2017-01-14 DIAGNOSIS — Z794 Long term (current) use of insulin: Secondary | ICD-10-CM | POA: Diagnosis not present

## 2017-01-15 DIAGNOSIS — G939 Disorder of brain, unspecified: Secondary | ICD-10-CM | POA: Diagnosis not present

## 2017-01-15 DIAGNOSIS — Z794 Long term (current) use of insulin: Secondary | ICD-10-CM | POA: Diagnosis not present

## 2017-01-15 DIAGNOSIS — N179 Acute kidney failure, unspecified: Secondary | ICD-10-CM | POA: Diagnosis not present

## 2017-01-15 DIAGNOSIS — M25562 Pain in left knee: Secondary | ICD-10-CM | POA: Diagnosis not present

## 2017-01-15 DIAGNOSIS — D509 Iron deficiency anemia, unspecified: Secondary | ICD-10-CM | POA: Diagnosis not present

## 2017-01-15 DIAGNOSIS — M25561 Pain in right knee: Secondary | ICD-10-CM | POA: Diagnosis not present

## 2017-01-15 DIAGNOSIS — E1122 Type 2 diabetes mellitus with diabetic chronic kidney disease: Secondary | ICD-10-CM | POA: Diagnosis not present

## 2017-01-15 DIAGNOSIS — I129 Hypertensive chronic kidney disease with stage 1 through stage 4 chronic kidney disease, or unspecified chronic kidney disease: Secondary | ICD-10-CM | POA: Diagnosis not present

## 2017-01-15 DIAGNOSIS — Z9181 History of falling: Secondary | ICD-10-CM | POA: Diagnosis not present

## 2017-01-15 DIAGNOSIS — N184 Chronic kidney disease, stage 4 (severe): Secondary | ICD-10-CM | POA: Diagnosis not present

## 2017-01-16 DIAGNOSIS — Z9181 History of falling: Secondary | ICD-10-CM | POA: Diagnosis not present

## 2017-01-16 DIAGNOSIS — D509 Iron deficiency anemia, unspecified: Secondary | ICD-10-CM | POA: Diagnosis not present

## 2017-01-16 DIAGNOSIS — M25562 Pain in left knee: Secondary | ICD-10-CM | POA: Diagnosis not present

## 2017-01-16 DIAGNOSIS — Z794 Long term (current) use of insulin: Secondary | ICD-10-CM | POA: Diagnosis not present

## 2017-01-16 DIAGNOSIS — G939 Disorder of brain, unspecified: Secondary | ICD-10-CM | POA: Diagnosis not present

## 2017-01-16 DIAGNOSIS — N179 Acute kidney failure, unspecified: Secondary | ICD-10-CM | POA: Diagnosis not present

## 2017-01-16 DIAGNOSIS — M25561 Pain in right knee: Secondary | ICD-10-CM | POA: Diagnosis not present

## 2017-01-16 DIAGNOSIS — N184 Chronic kidney disease, stage 4 (severe): Secondary | ICD-10-CM | POA: Diagnosis not present

## 2017-01-16 DIAGNOSIS — E1122 Type 2 diabetes mellitus with diabetic chronic kidney disease: Secondary | ICD-10-CM | POA: Diagnosis not present

## 2017-01-16 DIAGNOSIS — I129 Hypertensive chronic kidney disease with stage 1 through stage 4 chronic kidney disease, or unspecified chronic kidney disease: Secondary | ICD-10-CM | POA: Diagnosis not present

## 2017-01-19 DIAGNOSIS — Z9181 History of falling: Secondary | ICD-10-CM | POA: Diagnosis not present

## 2017-01-19 DIAGNOSIS — I129 Hypertensive chronic kidney disease with stage 1 through stage 4 chronic kidney disease, or unspecified chronic kidney disease: Secondary | ICD-10-CM | POA: Diagnosis not present

## 2017-01-19 DIAGNOSIS — M25562 Pain in left knee: Secondary | ICD-10-CM | POA: Diagnosis not present

## 2017-01-19 DIAGNOSIS — G939 Disorder of brain, unspecified: Secondary | ICD-10-CM | POA: Diagnosis not present

## 2017-01-19 DIAGNOSIS — N179 Acute kidney failure, unspecified: Secondary | ICD-10-CM | POA: Diagnosis not present

## 2017-01-19 DIAGNOSIS — N184 Chronic kidney disease, stage 4 (severe): Secondary | ICD-10-CM | POA: Diagnosis not present

## 2017-01-19 DIAGNOSIS — D509 Iron deficiency anemia, unspecified: Secondary | ICD-10-CM | POA: Diagnosis not present

## 2017-01-19 DIAGNOSIS — E1122 Type 2 diabetes mellitus with diabetic chronic kidney disease: Secondary | ICD-10-CM | POA: Diagnosis not present

## 2017-01-19 DIAGNOSIS — M25561 Pain in right knee: Secondary | ICD-10-CM | POA: Diagnosis not present

## 2017-01-19 DIAGNOSIS — Z794 Long term (current) use of insulin: Secondary | ICD-10-CM | POA: Diagnosis not present

## 2017-01-20 DIAGNOSIS — Z794 Long term (current) use of insulin: Secondary | ICD-10-CM | POA: Diagnosis not present

## 2017-01-20 DIAGNOSIS — G939 Disorder of brain, unspecified: Secondary | ICD-10-CM | POA: Diagnosis not present

## 2017-01-20 DIAGNOSIS — Z9181 History of falling: Secondary | ICD-10-CM | POA: Diagnosis not present

## 2017-01-20 DIAGNOSIS — I129 Hypertensive chronic kidney disease with stage 1 through stage 4 chronic kidney disease, or unspecified chronic kidney disease: Secondary | ICD-10-CM | POA: Diagnosis not present

## 2017-01-20 DIAGNOSIS — D509 Iron deficiency anemia, unspecified: Secondary | ICD-10-CM | POA: Diagnosis not present

## 2017-01-20 DIAGNOSIS — E1122 Type 2 diabetes mellitus with diabetic chronic kidney disease: Secondary | ICD-10-CM | POA: Diagnosis not present

## 2017-01-20 DIAGNOSIS — N179 Acute kidney failure, unspecified: Secondary | ICD-10-CM | POA: Diagnosis not present

## 2017-01-20 DIAGNOSIS — N184 Chronic kidney disease, stage 4 (severe): Secondary | ICD-10-CM | POA: Diagnosis not present

## 2017-01-20 DIAGNOSIS — M25561 Pain in right knee: Secondary | ICD-10-CM | POA: Diagnosis not present

## 2017-01-20 DIAGNOSIS — M25562 Pain in left knee: Secondary | ICD-10-CM | POA: Diagnosis not present

## 2017-01-21 DIAGNOSIS — I129 Hypertensive chronic kidney disease with stage 1 through stage 4 chronic kidney disease, or unspecified chronic kidney disease: Secondary | ICD-10-CM | POA: Diagnosis not present

## 2017-01-21 DIAGNOSIS — M25561 Pain in right knee: Secondary | ICD-10-CM | POA: Diagnosis not present

## 2017-01-21 DIAGNOSIS — N184 Chronic kidney disease, stage 4 (severe): Secondary | ICD-10-CM | POA: Diagnosis not present

## 2017-01-21 DIAGNOSIS — D509 Iron deficiency anemia, unspecified: Secondary | ICD-10-CM | POA: Diagnosis not present

## 2017-01-21 DIAGNOSIS — N179 Acute kidney failure, unspecified: Secondary | ICD-10-CM | POA: Diagnosis not present

## 2017-01-21 DIAGNOSIS — Z794 Long term (current) use of insulin: Secondary | ICD-10-CM | POA: Diagnosis not present

## 2017-01-21 DIAGNOSIS — G939 Disorder of brain, unspecified: Secondary | ICD-10-CM | POA: Diagnosis not present

## 2017-01-21 DIAGNOSIS — Z9181 History of falling: Secondary | ICD-10-CM | POA: Diagnosis not present

## 2017-01-21 DIAGNOSIS — E1122 Type 2 diabetes mellitus with diabetic chronic kidney disease: Secondary | ICD-10-CM | POA: Diagnosis not present

## 2017-01-21 DIAGNOSIS — M25562 Pain in left knee: Secondary | ICD-10-CM | POA: Diagnosis not present

## 2017-01-23 DIAGNOSIS — Z794 Long term (current) use of insulin: Secondary | ICD-10-CM | POA: Diagnosis not present

## 2017-01-23 DIAGNOSIS — Z9181 History of falling: Secondary | ICD-10-CM | POA: Diagnosis not present

## 2017-01-23 DIAGNOSIS — N184 Chronic kidney disease, stage 4 (severe): Secondary | ICD-10-CM | POA: Diagnosis not present

## 2017-01-23 DIAGNOSIS — I129 Hypertensive chronic kidney disease with stage 1 through stage 4 chronic kidney disease, or unspecified chronic kidney disease: Secondary | ICD-10-CM | POA: Diagnosis not present

## 2017-01-23 DIAGNOSIS — G939 Disorder of brain, unspecified: Secondary | ICD-10-CM | POA: Diagnosis not present

## 2017-01-23 DIAGNOSIS — D509 Iron deficiency anemia, unspecified: Secondary | ICD-10-CM | POA: Diagnosis not present

## 2017-01-23 DIAGNOSIS — E1122 Type 2 diabetes mellitus with diabetic chronic kidney disease: Secondary | ICD-10-CM | POA: Diagnosis not present

## 2017-01-23 DIAGNOSIS — M25561 Pain in right knee: Secondary | ICD-10-CM | POA: Diagnosis not present

## 2017-01-23 DIAGNOSIS — M25562 Pain in left knee: Secondary | ICD-10-CM | POA: Diagnosis not present

## 2017-01-23 DIAGNOSIS — N179 Acute kidney failure, unspecified: Secondary | ICD-10-CM | POA: Diagnosis not present

## 2017-01-26 DIAGNOSIS — Z794 Long term (current) use of insulin: Secondary | ICD-10-CM | POA: Diagnosis not present

## 2017-01-26 DIAGNOSIS — M25562 Pain in left knee: Secondary | ICD-10-CM | POA: Diagnosis not present

## 2017-01-26 DIAGNOSIS — Z9181 History of falling: Secondary | ICD-10-CM | POA: Diagnosis not present

## 2017-01-26 DIAGNOSIS — G939 Disorder of brain, unspecified: Secondary | ICD-10-CM | POA: Diagnosis not present

## 2017-01-26 DIAGNOSIS — N184 Chronic kidney disease, stage 4 (severe): Secondary | ICD-10-CM | POA: Diagnosis not present

## 2017-01-26 DIAGNOSIS — D509 Iron deficiency anemia, unspecified: Secondary | ICD-10-CM | POA: Diagnosis not present

## 2017-01-26 DIAGNOSIS — E1122 Type 2 diabetes mellitus with diabetic chronic kidney disease: Secondary | ICD-10-CM | POA: Diagnosis not present

## 2017-01-26 DIAGNOSIS — I129 Hypertensive chronic kidney disease with stage 1 through stage 4 chronic kidney disease, or unspecified chronic kidney disease: Secondary | ICD-10-CM | POA: Diagnosis not present

## 2017-01-26 DIAGNOSIS — N179 Acute kidney failure, unspecified: Secondary | ICD-10-CM | POA: Diagnosis not present

## 2017-01-26 DIAGNOSIS — M25561 Pain in right knee: Secondary | ICD-10-CM | POA: Diagnosis not present

## 2017-01-27 DIAGNOSIS — M25561 Pain in right knee: Secondary | ICD-10-CM | POA: Diagnosis not present

## 2017-01-27 DIAGNOSIS — I129 Hypertensive chronic kidney disease with stage 1 through stage 4 chronic kidney disease, or unspecified chronic kidney disease: Secondary | ICD-10-CM | POA: Diagnosis not present

## 2017-01-27 DIAGNOSIS — M25562 Pain in left knee: Secondary | ICD-10-CM | POA: Diagnosis not present

## 2017-01-27 DIAGNOSIS — N184 Chronic kidney disease, stage 4 (severe): Secondary | ICD-10-CM | POA: Diagnosis not present

## 2017-01-27 DIAGNOSIS — G939 Disorder of brain, unspecified: Secondary | ICD-10-CM | POA: Diagnosis not present

## 2017-01-27 DIAGNOSIS — Z794 Long term (current) use of insulin: Secondary | ICD-10-CM | POA: Diagnosis not present

## 2017-01-27 DIAGNOSIS — D509 Iron deficiency anemia, unspecified: Secondary | ICD-10-CM | POA: Diagnosis not present

## 2017-01-27 DIAGNOSIS — Z9181 History of falling: Secondary | ICD-10-CM | POA: Diagnosis not present

## 2017-01-27 DIAGNOSIS — E1122 Type 2 diabetes mellitus with diabetic chronic kidney disease: Secondary | ICD-10-CM | POA: Diagnosis not present

## 2017-01-27 DIAGNOSIS — N179 Acute kidney failure, unspecified: Secondary | ICD-10-CM | POA: Diagnosis not present

## 2017-01-28 DIAGNOSIS — M25561 Pain in right knee: Secondary | ICD-10-CM | POA: Diagnosis not present

## 2017-01-28 DIAGNOSIS — I129 Hypertensive chronic kidney disease with stage 1 through stage 4 chronic kidney disease, or unspecified chronic kidney disease: Secondary | ICD-10-CM | POA: Diagnosis not present

## 2017-01-28 DIAGNOSIS — Z9181 History of falling: Secondary | ICD-10-CM | POA: Diagnosis not present

## 2017-01-28 DIAGNOSIS — M25562 Pain in left knee: Secondary | ICD-10-CM | POA: Diagnosis not present

## 2017-01-28 DIAGNOSIS — G939 Disorder of brain, unspecified: Secondary | ICD-10-CM | POA: Diagnosis not present

## 2017-01-28 DIAGNOSIS — N179 Acute kidney failure, unspecified: Secondary | ICD-10-CM | POA: Diagnosis not present

## 2017-01-28 DIAGNOSIS — N184 Chronic kidney disease, stage 4 (severe): Secondary | ICD-10-CM | POA: Diagnosis not present

## 2017-01-28 DIAGNOSIS — E1122 Type 2 diabetes mellitus with diabetic chronic kidney disease: Secondary | ICD-10-CM | POA: Diagnosis not present

## 2017-01-28 DIAGNOSIS — Z794 Long term (current) use of insulin: Secondary | ICD-10-CM | POA: Diagnosis not present

## 2017-01-28 DIAGNOSIS — D509 Iron deficiency anemia, unspecified: Secondary | ICD-10-CM | POA: Diagnosis not present

## 2017-01-29 DIAGNOSIS — D509 Iron deficiency anemia, unspecified: Secondary | ICD-10-CM | POA: Diagnosis not present

## 2017-01-29 DIAGNOSIS — N184 Chronic kidney disease, stage 4 (severe): Secondary | ICD-10-CM | POA: Diagnosis not present

## 2017-01-29 DIAGNOSIS — M25562 Pain in left knee: Secondary | ICD-10-CM | POA: Diagnosis not present

## 2017-01-29 DIAGNOSIS — N179 Acute kidney failure, unspecified: Secondary | ICD-10-CM | POA: Diagnosis not present

## 2017-01-29 DIAGNOSIS — Z9181 History of falling: Secondary | ICD-10-CM | POA: Diagnosis not present

## 2017-01-29 DIAGNOSIS — M25561 Pain in right knee: Secondary | ICD-10-CM | POA: Diagnosis not present

## 2017-01-29 DIAGNOSIS — Z794 Long term (current) use of insulin: Secondary | ICD-10-CM | POA: Diagnosis not present

## 2017-01-29 DIAGNOSIS — G939 Disorder of brain, unspecified: Secondary | ICD-10-CM | POA: Diagnosis not present

## 2017-01-29 DIAGNOSIS — I129 Hypertensive chronic kidney disease with stage 1 through stage 4 chronic kidney disease, or unspecified chronic kidney disease: Secondary | ICD-10-CM | POA: Diagnosis not present

## 2017-01-29 DIAGNOSIS — E1122 Type 2 diabetes mellitus with diabetic chronic kidney disease: Secondary | ICD-10-CM | POA: Diagnosis not present

## 2017-01-30 DIAGNOSIS — Z9181 History of falling: Secondary | ICD-10-CM | POA: Diagnosis not present

## 2017-01-30 DIAGNOSIS — G939 Disorder of brain, unspecified: Secondary | ICD-10-CM | POA: Diagnosis not present

## 2017-01-30 DIAGNOSIS — D509 Iron deficiency anemia, unspecified: Secondary | ICD-10-CM | POA: Diagnosis not present

## 2017-01-30 DIAGNOSIS — M25561 Pain in right knee: Secondary | ICD-10-CM | POA: Diagnosis not present

## 2017-01-30 DIAGNOSIS — E1122 Type 2 diabetes mellitus with diabetic chronic kidney disease: Secondary | ICD-10-CM | POA: Diagnosis not present

## 2017-01-30 DIAGNOSIS — N179 Acute kidney failure, unspecified: Secondary | ICD-10-CM | POA: Diagnosis not present

## 2017-01-30 DIAGNOSIS — N184 Chronic kidney disease, stage 4 (severe): Secondary | ICD-10-CM | POA: Diagnosis not present

## 2017-01-30 DIAGNOSIS — I129 Hypertensive chronic kidney disease with stage 1 through stage 4 chronic kidney disease, or unspecified chronic kidney disease: Secondary | ICD-10-CM | POA: Diagnosis not present

## 2017-01-30 DIAGNOSIS — Z794 Long term (current) use of insulin: Secondary | ICD-10-CM | POA: Diagnosis not present

## 2017-01-30 DIAGNOSIS — M25562 Pain in left knee: Secondary | ICD-10-CM | POA: Diagnosis not present

## 2017-02-02 DIAGNOSIS — N179 Acute kidney failure, unspecified: Secondary | ICD-10-CM | POA: Diagnosis not present

## 2017-02-02 DIAGNOSIS — I129 Hypertensive chronic kidney disease with stage 1 through stage 4 chronic kidney disease, or unspecified chronic kidney disease: Secondary | ICD-10-CM | POA: Diagnosis not present

## 2017-02-02 DIAGNOSIS — N184 Chronic kidney disease, stage 4 (severe): Secondary | ICD-10-CM | POA: Diagnosis not present

## 2017-02-02 DIAGNOSIS — E1122 Type 2 diabetes mellitus with diabetic chronic kidney disease: Secondary | ICD-10-CM | POA: Diagnosis not present

## 2017-02-02 DIAGNOSIS — Z9181 History of falling: Secondary | ICD-10-CM | POA: Diagnosis not present

## 2017-02-02 DIAGNOSIS — M25561 Pain in right knee: Secondary | ICD-10-CM | POA: Diagnosis not present

## 2017-02-02 DIAGNOSIS — Z794 Long term (current) use of insulin: Secondary | ICD-10-CM | POA: Diagnosis not present

## 2017-02-02 DIAGNOSIS — D509 Iron deficiency anemia, unspecified: Secondary | ICD-10-CM | POA: Diagnosis not present

## 2017-02-02 DIAGNOSIS — G939 Disorder of brain, unspecified: Secondary | ICD-10-CM | POA: Diagnosis not present

## 2017-02-02 DIAGNOSIS — M25562 Pain in left knee: Secondary | ICD-10-CM | POA: Diagnosis not present

## 2017-02-03 DIAGNOSIS — Z9181 History of falling: Secondary | ICD-10-CM | POA: Diagnosis not present

## 2017-02-03 DIAGNOSIS — D509 Iron deficiency anemia, unspecified: Secondary | ICD-10-CM | POA: Diagnosis not present

## 2017-02-03 DIAGNOSIS — N179 Acute kidney failure, unspecified: Secondary | ICD-10-CM | POA: Diagnosis not present

## 2017-02-03 DIAGNOSIS — M25561 Pain in right knee: Secondary | ICD-10-CM | POA: Diagnosis not present

## 2017-02-03 DIAGNOSIS — N184 Chronic kidney disease, stage 4 (severe): Secondary | ICD-10-CM | POA: Diagnosis not present

## 2017-02-03 DIAGNOSIS — E1122 Type 2 diabetes mellitus with diabetic chronic kidney disease: Secondary | ICD-10-CM | POA: Diagnosis not present

## 2017-02-03 DIAGNOSIS — I129 Hypertensive chronic kidney disease with stage 1 through stage 4 chronic kidney disease, or unspecified chronic kidney disease: Secondary | ICD-10-CM | POA: Diagnosis not present

## 2017-02-03 DIAGNOSIS — G939 Disorder of brain, unspecified: Secondary | ICD-10-CM | POA: Diagnosis not present

## 2017-02-03 DIAGNOSIS — M25562 Pain in left knee: Secondary | ICD-10-CM | POA: Diagnosis not present

## 2017-02-03 DIAGNOSIS — Z794 Long term (current) use of insulin: Secondary | ICD-10-CM | POA: Diagnosis not present

## 2017-02-04 DIAGNOSIS — Z794 Long term (current) use of insulin: Secondary | ICD-10-CM | POA: Diagnosis not present

## 2017-02-04 DIAGNOSIS — G939 Disorder of brain, unspecified: Secondary | ICD-10-CM | POA: Diagnosis not present

## 2017-02-04 DIAGNOSIS — I129 Hypertensive chronic kidney disease with stage 1 through stage 4 chronic kidney disease, or unspecified chronic kidney disease: Secondary | ICD-10-CM | POA: Diagnosis not present

## 2017-02-04 DIAGNOSIS — Z9181 History of falling: Secondary | ICD-10-CM | POA: Diagnosis not present

## 2017-02-04 DIAGNOSIS — E1122 Type 2 diabetes mellitus with diabetic chronic kidney disease: Secondary | ICD-10-CM | POA: Diagnosis not present

## 2017-02-04 DIAGNOSIS — N184 Chronic kidney disease, stage 4 (severe): Secondary | ICD-10-CM | POA: Diagnosis not present

## 2017-02-04 DIAGNOSIS — N179 Acute kidney failure, unspecified: Secondary | ICD-10-CM | POA: Diagnosis not present

## 2017-02-04 DIAGNOSIS — D509 Iron deficiency anemia, unspecified: Secondary | ICD-10-CM | POA: Diagnosis not present

## 2017-02-04 DIAGNOSIS — M25561 Pain in right knee: Secondary | ICD-10-CM | POA: Diagnosis not present

## 2017-02-04 DIAGNOSIS — M25562 Pain in left knee: Secondary | ICD-10-CM | POA: Diagnosis not present

## 2017-02-05 DIAGNOSIS — M25562 Pain in left knee: Secondary | ICD-10-CM | POA: Diagnosis not present

## 2017-02-05 DIAGNOSIS — N184 Chronic kidney disease, stage 4 (severe): Secondary | ICD-10-CM | POA: Diagnosis not present

## 2017-02-05 DIAGNOSIS — N179 Acute kidney failure, unspecified: Secondary | ICD-10-CM | POA: Diagnosis not present

## 2017-02-05 DIAGNOSIS — G939 Disorder of brain, unspecified: Secondary | ICD-10-CM | POA: Diagnosis not present

## 2017-02-05 DIAGNOSIS — I129 Hypertensive chronic kidney disease with stage 1 through stage 4 chronic kidney disease, or unspecified chronic kidney disease: Secondary | ICD-10-CM | POA: Diagnosis not present

## 2017-02-05 DIAGNOSIS — D509 Iron deficiency anemia, unspecified: Secondary | ICD-10-CM | POA: Diagnosis not present

## 2017-02-05 DIAGNOSIS — Z9181 History of falling: Secondary | ICD-10-CM | POA: Diagnosis not present

## 2017-02-05 DIAGNOSIS — E1122 Type 2 diabetes mellitus with diabetic chronic kidney disease: Secondary | ICD-10-CM | POA: Diagnosis not present

## 2017-02-05 DIAGNOSIS — M25561 Pain in right knee: Secondary | ICD-10-CM | POA: Diagnosis not present

## 2017-02-05 DIAGNOSIS — Z794 Long term (current) use of insulin: Secondary | ICD-10-CM | POA: Diagnosis not present

## 2017-02-10 DIAGNOSIS — E1122 Type 2 diabetes mellitus with diabetic chronic kidney disease: Secondary | ICD-10-CM | POA: Diagnosis not present

## 2017-02-10 DIAGNOSIS — M25562 Pain in left knee: Secondary | ICD-10-CM | POA: Diagnosis not present

## 2017-02-10 DIAGNOSIS — Z794 Long term (current) use of insulin: Secondary | ICD-10-CM | POA: Diagnosis not present

## 2017-02-10 DIAGNOSIS — D509 Iron deficiency anemia, unspecified: Secondary | ICD-10-CM | POA: Diagnosis not present

## 2017-02-10 DIAGNOSIS — N179 Acute kidney failure, unspecified: Secondary | ICD-10-CM | POA: Diagnosis not present

## 2017-02-10 DIAGNOSIS — N184 Chronic kidney disease, stage 4 (severe): Secondary | ICD-10-CM | POA: Diagnosis not present

## 2017-02-10 DIAGNOSIS — I129 Hypertensive chronic kidney disease with stage 1 through stage 4 chronic kidney disease, or unspecified chronic kidney disease: Secondary | ICD-10-CM | POA: Diagnosis not present

## 2017-02-10 DIAGNOSIS — M25561 Pain in right knee: Secondary | ICD-10-CM | POA: Diagnosis not present

## 2017-02-10 DIAGNOSIS — Z9181 History of falling: Secondary | ICD-10-CM | POA: Diagnosis not present

## 2017-02-10 DIAGNOSIS — G939 Disorder of brain, unspecified: Secondary | ICD-10-CM | POA: Diagnosis not present

## 2017-02-11 DIAGNOSIS — Z794 Long term (current) use of insulin: Secondary | ICD-10-CM | POA: Diagnosis not present

## 2017-02-11 DIAGNOSIS — N179 Acute kidney failure, unspecified: Secondary | ICD-10-CM | POA: Diagnosis not present

## 2017-02-11 DIAGNOSIS — D509 Iron deficiency anemia, unspecified: Secondary | ICD-10-CM | POA: Diagnosis not present

## 2017-02-11 DIAGNOSIS — I129 Hypertensive chronic kidney disease with stage 1 through stage 4 chronic kidney disease, or unspecified chronic kidney disease: Secondary | ICD-10-CM | POA: Diagnosis not present

## 2017-02-11 DIAGNOSIS — M25562 Pain in left knee: Secondary | ICD-10-CM | POA: Diagnosis not present

## 2017-02-11 DIAGNOSIS — E1122 Type 2 diabetes mellitus with diabetic chronic kidney disease: Secondary | ICD-10-CM | POA: Diagnosis not present

## 2017-02-11 DIAGNOSIS — Z9181 History of falling: Secondary | ICD-10-CM | POA: Diagnosis not present

## 2017-02-11 DIAGNOSIS — M25561 Pain in right knee: Secondary | ICD-10-CM | POA: Diagnosis not present

## 2017-02-11 DIAGNOSIS — G939 Disorder of brain, unspecified: Secondary | ICD-10-CM | POA: Diagnosis not present

## 2017-02-11 DIAGNOSIS — N184 Chronic kidney disease, stage 4 (severe): Secondary | ICD-10-CM | POA: Diagnosis not present

## 2017-02-12 DIAGNOSIS — M25561 Pain in right knee: Secondary | ICD-10-CM | POA: Diagnosis not present

## 2017-02-12 DIAGNOSIS — N184 Chronic kidney disease, stage 4 (severe): Secondary | ICD-10-CM | POA: Diagnosis not present

## 2017-02-12 DIAGNOSIS — I129 Hypertensive chronic kidney disease with stage 1 through stage 4 chronic kidney disease, or unspecified chronic kidney disease: Secondary | ICD-10-CM | POA: Diagnosis not present

## 2017-02-12 DIAGNOSIS — D509 Iron deficiency anemia, unspecified: Secondary | ICD-10-CM | POA: Diagnosis not present

## 2017-02-12 DIAGNOSIS — Z794 Long term (current) use of insulin: Secondary | ICD-10-CM | POA: Diagnosis not present

## 2017-02-12 DIAGNOSIS — G939 Disorder of brain, unspecified: Secondary | ICD-10-CM | POA: Diagnosis not present

## 2017-02-12 DIAGNOSIS — E1122 Type 2 diabetes mellitus with diabetic chronic kidney disease: Secondary | ICD-10-CM | POA: Diagnosis not present

## 2017-02-12 DIAGNOSIS — M25562 Pain in left knee: Secondary | ICD-10-CM | POA: Diagnosis not present

## 2017-02-12 DIAGNOSIS — N179 Acute kidney failure, unspecified: Secondary | ICD-10-CM | POA: Diagnosis not present

## 2017-02-12 DIAGNOSIS — Z9181 History of falling: Secondary | ICD-10-CM | POA: Diagnosis not present

## 2017-02-13 DIAGNOSIS — Z794 Long term (current) use of insulin: Secondary | ICD-10-CM | POA: Diagnosis not present

## 2017-02-13 DIAGNOSIS — E1122 Type 2 diabetes mellitus with diabetic chronic kidney disease: Secondary | ICD-10-CM | POA: Diagnosis not present

## 2017-02-13 DIAGNOSIS — Z9181 History of falling: Secondary | ICD-10-CM | POA: Diagnosis not present

## 2017-02-13 DIAGNOSIS — I129 Hypertensive chronic kidney disease with stage 1 through stage 4 chronic kidney disease, or unspecified chronic kidney disease: Secondary | ICD-10-CM | POA: Diagnosis not present

## 2017-02-13 DIAGNOSIS — G939 Disorder of brain, unspecified: Secondary | ICD-10-CM | POA: Diagnosis not present

## 2017-02-13 DIAGNOSIS — N179 Acute kidney failure, unspecified: Secondary | ICD-10-CM | POA: Diagnosis not present

## 2017-02-13 DIAGNOSIS — M25561 Pain in right knee: Secondary | ICD-10-CM | POA: Diagnosis not present

## 2017-02-13 DIAGNOSIS — D509 Iron deficiency anemia, unspecified: Secondary | ICD-10-CM | POA: Diagnosis not present

## 2017-02-13 DIAGNOSIS — M25562 Pain in left knee: Secondary | ICD-10-CM | POA: Diagnosis not present

## 2017-02-13 DIAGNOSIS — N184 Chronic kidney disease, stage 4 (severe): Secondary | ICD-10-CM | POA: Diagnosis not present

## 2017-02-16 DIAGNOSIS — G939 Disorder of brain, unspecified: Secondary | ICD-10-CM | POA: Diagnosis not present

## 2017-02-16 DIAGNOSIS — Z794 Long term (current) use of insulin: Secondary | ICD-10-CM | POA: Diagnosis not present

## 2017-02-16 DIAGNOSIS — N184 Chronic kidney disease, stage 4 (severe): Secondary | ICD-10-CM | POA: Diagnosis not present

## 2017-02-16 DIAGNOSIS — E1122 Type 2 diabetes mellitus with diabetic chronic kidney disease: Secondary | ICD-10-CM | POA: Diagnosis not present

## 2017-02-16 DIAGNOSIS — M25561 Pain in right knee: Secondary | ICD-10-CM | POA: Diagnosis not present

## 2017-02-16 DIAGNOSIS — D509 Iron deficiency anemia, unspecified: Secondary | ICD-10-CM | POA: Diagnosis not present

## 2017-02-16 DIAGNOSIS — Z9181 History of falling: Secondary | ICD-10-CM | POA: Diagnosis not present

## 2017-02-16 DIAGNOSIS — N179 Acute kidney failure, unspecified: Secondary | ICD-10-CM | POA: Diagnosis not present

## 2017-02-16 DIAGNOSIS — I129 Hypertensive chronic kidney disease with stage 1 through stage 4 chronic kidney disease, or unspecified chronic kidney disease: Secondary | ICD-10-CM | POA: Diagnosis not present

## 2017-02-16 DIAGNOSIS — M25562 Pain in left knee: Secondary | ICD-10-CM | POA: Diagnosis not present

## 2017-02-17 DIAGNOSIS — N179 Acute kidney failure, unspecified: Secondary | ICD-10-CM | POA: Diagnosis not present

## 2017-02-17 DIAGNOSIS — D509 Iron deficiency anemia, unspecified: Secondary | ICD-10-CM | POA: Diagnosis not present

## 2017-02-17 DIAGNOSIS — Z9181 History of falling: Secondary | ICD-10-CM | POA: Diagnosis not present

## 2017-02-17 DIAGNOSIS — E1122 Type 2 diabetes mellitus with diabetic chronic kidney disease: Secondary | ICD-10-CM | POA: Diagnosis not present

## 2017-02-17 DIAGNOSIS — M25562 Pain in left knee: Secondary | ICD-10-CM | POA: Diagnosis not present

## 2017-02-17 DIAGNOSIS — M25561 Pain in right knee: Secondary | ICD-10-CM | POA: Diagnosis not present

## 2017-02-17 DIAGNOSIS — N184 Chronic kidney disease, stage 4 (severe): Secondary | ICD-10-CM | POA: Diagnosis not present

## 2017-02-17 DIAGNOSIS — Z794 Long term (current) use of insulin: Secondary | ICD-10-CM | POA: Diagnosis not present

## 2017-02-17 DIAGNOSIS — G939 Disorder of brain, unspecified: Secondary | ICD-10-CM | POA: Diagnosis not present

## 2017-02-17 DIAGNOSIS — I129 Hypertensive chronic kidney disease with stage 1 through stage 4 chronic kidney disease, or unspecified chronic kidney disease: Secondary | ICD-10-CM | POA: Diagnosis not present

## 2017-02-18 DIAGNOSIS — M25562 Pain in left knee: Secondary | ICD-10-CM | POA: Diagnosis not present

## 2017-02-18 DIAGNOSIS — Z9181 History of falling: Secondary | ICD-10-CM | POA: Diagnosis not present

## 2017-02-18 DIAGNOSIS — M25561 Pain in right knee: Secondary | ICD-10-CM | POA: Diagnosis not present

## 2017-02-18 DIAGNOSIS — N184 Chronic kidney disease, stage 4 (severe): Secondary | ICD-10-CM | POA: Diagnosis not present

## 2017-02-18 DIAGNOSIS — D509 Iron deficiency anemia, unspecified: Secondary | ICD-10-CM | POA: Diagnosis not present

## 2017-02-18 DIAGNOSIS — I129 Hypertensive chronic kidney disease with stage 1 through stage 4 chronic kidney disease, or unspecified chronic kidney disease: Secondary | ICD-10-CM | POA: Diagnosis not present

## 2017-02-18 DIAGNOSIS — E1122 Type 2 diabetes mellitus with diabetic chronic kidney disease: Secondary | ICD-10-CM | POA: Diagnosis not present

## 2017-02-18 DIAGNOSIS — Z794 Long term (current) use of insulin: Secondary | ICD-10-CM | POA: Diagnosis not present

## 2017-02-18 DIAGNOSIS — G939 Disorder of brain, unspecified: Secondary | ICD-10-CM | POA: Diagnosis not present

## 2017-02-18 DIAGNOSIS — N179 Acute kidney failure, unspecified: Secondary | ICD-10-CM | POA: Diagnosis not present

## 2017-02-19 DIAGNOSIS — N179 Acute kidney failure, unspecified: Secondary | ICD-10-CM | POA: Diagnosis not present

## 2017-02-19 DIAGNOSIS — D509 Iron deficiency anemia, unspecified: Secondary | ICD-10-CM | POA: Diagnosis not present

## 2017-02-19 DIAGNOSIS — M25562 Pain in left knee: Secondary | ICD-10-CM | POA: Diagnosis not present

## 2017-02-19 DIAGNOSIS — G939 Disorder of brain, unspecified: Secondary | ICD-10-CM | POA: Diagnosis not present

## 2017-02-19 DIAGNOSIS — I129 Hypertensive chronic kidney disease with stage 1 through stage 4 chronic kidney disease, or unspecified chronic kidney disease: Secondary | ICD-10-CM | POA: Diagnosis not present

## 2017-02-19 DIAGNOSIS — Z9181 History of falling: Secondary | ICD-10-CM | POA: Diagnosis not present

## 2017-02-19 DIAGNOSIS — N184 Chronic kidney disease, stage 4 (severe): Secondary | ICD-10-CM | POA: Diagnosis not present

## 2017-02-19 DIAGNOSIS — M25561 Pain in right knee: Secondary | ICD-10-CM | POA: Diagnosis not present

## 2017-02-19 DIAGNOSIS — E1122 Type 2 diabetes mellitus with diabetic chronic kidney disease: Secondary | ICD-10-CM | POA: Diagnosis not present

## 2017-02-19 DIAGNOSIS — Z794 Long term (current) use of insulin: Secondary | ICD-10-CM | POA: Diagnosis not present

## 2017-02-23 DIAGNOSIS — N184 Chronic kidney disease, stage 4 (severe): Secondary | ICD-10-CM | POA: Diagnosis not present

## 2017-02-23 DIAGNOSIS — M25562 Pain in left knee: Secondary | ICD-10-CM | POA: Diagnosis not present

## 2017-02-23 DIAGNOSIS — Z794 Long term (current) use of insulin: Secondary | ICD-10-CM | POA: Diagnosis not present

## 2017-02-23 DIAGNOSIS — D509 Iron deficiency anemia, unspecified: Secondary | ICD-10-CM | POA: Diagnosis not present

## 2017-02-23 DIAGNOSIS — I129 Hypertensive chronic kidney disease with stage 1 through stage 4 chronic kidney disease, or unspecified chronic kidney disease: Secondary | ICD-10-CM | POA: Diagnosis not present

## 2017-02-23 DIAGNOSIS — E1122 Type 2 diabetes mellitus with diabetic chronic kidney disease: Secondary | ICD-10-CM | POA: Diagnosis not present

## 2017-02-23 DIAGNOSIS — M25561 Pain in right knee: Secondary | ICD-10-CM | POA: Diagnosis not present

## 2017-02-23 DIAGNOSIS — Z9181 History of falling: Secondary | ICD-10-CM | POA: Diagnosis not present

## 2017-02-23 DIAGNOSIS — N179 Acute kidney failure, unspecified: Secondary | ICD-10-CM | POA: Diagnosis not present

## 2017-02-23 DIAGNOSIS — G939 Disorder of brain, unspecified: Secondary | ICD-10-CM | POA: Diagnosis not present

## 2017-02-24 DIAGNOSIS — E1122 Type 2 diabetes mellitus with diabetic chronic kidney disease: Secondary | ICD-10-CM | POA: Diagnosis not present

## 2017-02-24 DIAGNOSIS — Z9181 History of falling: Secondary | ICD-10-CM | POA: Diagnosis not present

## 2017-02-24 DIAGNOSIS — N184 Chronic kidney disease, stage 4 (severe): Secondary | ICD-10-CM | POA: Diagnosis not present

## 2017-02-24 DIAGNOSIS — N179 Acute kidney failure, unspecified: Secondary | ICD-10-CM | POA: Diagnosis not present

## 2017-02-24 DIAGNOSIS — Z794 Long term (current) use of insulin: Secondary | ICD-10-CM | POA: Diagnosis not present

## 2017-02-24 DIAGNOSIS — D509 Iron deficiency anemia, unspecified: Secondary | ICD-10-CM | POA: Diagnosis not present

## 2017-02-24 DIAGNOSIS — G939 Disorder of brain, unspecified: Secondary | ICD-10-CM | POA: Diagnosis not present

## 2017-02-24 DIAGNOSIS — M25562 Pain in left knee: Secondary | ICD-10-CM | POA: Diagnosis not present

## 2017-02-24 DIAGNOSIS — I129 Hypertensive chronic kidney disease with stage 1 through stage 4 chronic kidney disease, or unspecified chronic kidney disease: Secondary | ICD-10-CM | POA: Diagnosis not present

## 2017-02-24 DIAGNOSIS — M25561 Pain in right knee: Secondary | ICD-10-CM | POA: Diagnosis not present

## 2017-02-26 DIAGNOSIS — E1122 Type 2 diabetes mellitus with diabetic chronic kidney disease: Secondary | ICD-10-CM | POA: Diagnosis not present

## 2017-02-26 DIAGNOSIS — M25562 Pain in left knee: Secondary | ICD-10-CM | POA: Diagnosis not present

## 2017-02-26 DIAGNOSIS — N179 Acute kidney failure, unspecified: Secondary | ICD-10-CM | POA: Diagnosis not present

## 2017-02-26 DIAGNOSIS — I129 Hypertensive chronic kidney disease with stage 1 through stage 4 chronic kidney disease, or unspecified chronic kidney disease: Secondary | ICD-10-CM | POA: Diagnosis not present

## 2017-02-26 DIAGNOSIS — D509 Iron deficiency anemia, unspecified: Secondary | ICD-10-CM | POA: Diagnosis not present

## 2017-02-26 DIAGNOSIS — Z9181 History of falling: Secondary | ICD-10-CM | POA: Diagnosis not present

## 2017-02-26 DIAGNOSIS — M25561 Pain in right knee: Secondary | ICD-10-CM | POA: Diagnosis not present

## 2017-02-26 DIAGNOSIS — N184 Chronic kidney disease, stage 4 (severe): Secondary | ICD-10-CM | POA: Diagnosis not present

## 2017-02-26 DIAGNOSIS — Z794 Long term (current) use of insulin: Secondary | ICD-10-CM | POA: Diagnosis not present

## 2017-02-26 DIAGNOSIS — G939 Disorder of brain, unspecified: Secondary | ICD-10-CM | POA: Diagnosis not present

## 2017-02-27 DIAGNOSIS — E1122 Type 2 diabetes mellitus with diabetic chronic kidney disease: Secondary | ICD-10-CM | POA: Diagnosis not present

## 2017-02-27 DIAGNOSIS — I129 Hypertensive chronic kidney disease with stage 1 through stage 4 chronic kidney disease, or unspecified chronic kidney disease: Secondary | ICD-10-CM | POA: Diagnosis not present

## 2017-02-27 DIAGNOSIS — M25562 Pain in left knee: Secondary | ICD-10-CM | POA: Diagnosis not present

## 2017-02-27 DIAGNOSIS — Z794 Long term (current) use of insulin: Secondary | ICD-10-CM | POA: Diagnosis not present

## 2017-02-27 DIAGNOSIS — G939 Disorder of brain, unspecified: Secondary | ICD-10-CM | POA: Diagnosis not present

## 2017-02-27 DIAGNOSIS — N179 Acute kidney failure, unspecified: Secondary | ICD-10-CM | POA: Diagnosis not present

## 2017-02-27 DIAGNOSIS — N184 Chronic kidney disease, stage 4 (severe): Secondary | ICD-10-CM | POA: Diagnosis not present

## 2017-02-27 DIAGNOSIS — Z9181 History of falling: Secondary | ICD-10-CM | POA: Diagnosis not present

## 2017-02-27 DIAGNOSIS — M25561 Pain in right knee: Secondary | ICD-10-CM | POA: Diagnosis not present

## 2017-02-27 DIAGNOSIS — D509 Iron deficiency anemia, unspecified: Secondary | ICD-10-CM | POA: Diagnosis not present

## 2017-03-02 DIAGNOSIS — M25561 Pain in right knee: Secondary | ICD-10-CM | POA: Diagnosis not present

## 2017-03-02 DIAGNOSIS — N184 Chronic kidney disease, stage 4 (severe): Secondary | ICD-10-CM | POA: Diagnosis not present

## 2017-03-02 DIAGNOSIS — Z794 Long term (current) use of insulin: Secondary | ICD-10-CM | POA: Diagnosis not present

## 2017-03-02 DIAGNOSIS — I129 Hypertensive chronic kidney disease with stage 1 through stage 4 chronic kidney disease, or unspecified chronic kidney disease: Secondary | ICD-10-CM | POA: Diagnosis not present

## 2017-03-02 DIAGNOSIS — E1122 Type 2 diabetes mellitus with diabetic chronic kidney disease: Secondary | ICD-10-CM | POA: Diagnosis not present

## 2017-03-02 DIAGNOSIS — Z9181 History of falling: Secondary | ICD-10-CM | POA: Diagnosis not present

## 2017-03-02 DIAGNOSIS — M25562 Pain in left knee: Secondary | ICD-10-CM | POA: Diagnosis not present

## 2017-03-02 DIAGNOSIS — G939 Disorder of brain, unspecified: Secondary | ICD-10-CM | POA: Diagnosis not present

## 2017-03-02 DIAGNOSIS — N179 Acute kidney failure, unspecified: Secondary | ICD-10-CM | POA: Diagnosis not present

## 2017-03-02 DIAGNOSIS — D509 Iron deficiency anemia, unspecified: Secondary | ICD-10-CM | POA: Diagnosis not present

## 2017-03-04 DIAGNOSIS — N179 Acute kidney failure, unspecified: Secondary | ICD-10-CM | POA: Diagnosis not present

## 2017-03-04 DIAGNOSIS — Z9181 History of falling: Secondary | ICD-10-CM | POA: Diagnosis not present

## 2017-03-04 DIAGNOSIS — I129 Hypertensive chronic kidney disease with stage 1 through stage 4 chronic kidney disease, or unspecified chronic kidney disease: Secondary | ICD-10-CM | POA: Diagnosis not present

## 2017-03-04 DIAGNOSIS — Z794 Long term (current) use of insulin: Secondary | ICD-10-CM | POA: Diagnosis not present

## 2017-03-04 DIAGNOSIS — E1122 Type 2 diabetes mellitus with diabetic chronic kidney disease: Secondary | ICD-10-CM | POA: Diagnosis not present

## 2017-03-04 DIAGNOSIS — N184 Chronic kidney disease, stage 4 (severe): Secondary | ICD-10-CM | POA: Diagnosis not present

## 2017-03-04 DIAGNOSIS — M25561 Pain in right knee: Secondary | ICD-10-CM | POA: Diagnosis not present

## 2017-03-04 DIAGNOSIS — D509 Iron deficiency anemia, unspecified: Secondary | ICD-10-CM | POA: Diagnosis not present

## 2017-03-04 DIAGNOSIS — M25562 Pain in left knee: Secondary | ICD-10-CM | POA: Diagnosis not present

## 2017-03-04 DIAGNOSIS — G939 Disorder of brain, unspecified: Secondary | ICD-10-CM | POA: Diagnosis not present

## 2017-03-05 DIAGNOSIS — I129 Hypertensive chronic kidney disease with stage 1 through stage 4 chronic kidney disease, or unspecified chronic kidney disease: Secondary | ICD-10-CM | POA: Diagnosis not present

## 2017-03-05 DIAGNOSIS — E1122 Type 2 diabetes mellitus with diabetic chronic kidney disease: Secondary | ICD-10-CM | POA: Diagnosis not present

## 2017-03-05 DIAGNOSIS — Z794 Long term (current) use of insulin: Secondary | ICD-10-CM | POA: Diagnosis not present

## 2017-03-05 DIAGNOSIS — M25562 Pain in left knee: Secondary | ICD-10-CM | POA: Diagnosis not present

## 2017-03-05 DIAGNOSIS — N179 Acute kidney failure, unspecified: Secondary | ICD-10-CM | POA: Diagnosis not present

## 2017-03-05 DIAGNOSIS — N184 Chronic kidney disease, stage 4 (severe): Secondary | ICD-10-CM | POA: Diagnosis not present

## 2017-03-05 DIAGNOSIS — M25561 Pain in right knee: Secondary | ICD-10-CM | POA: Diagnosis not present

## 2017-03-05 DIAGNOSIS — D509 Iron deficiency anemia, unspecified: Secondary | ICD-10-CM | POA: Diagnosis not present

## 2017-03-05 DIAGNOSIS — Z9181 History of falling: Secondary | ICD-10-CM | POA: Diagnosis not present

## 2017-03-05 DIAGNOSIS — G939 Disorder of brain, unspecified: Secondary | ICD-10-CM | POA: Diagnosis not present

## 2017-03-09 DIAGNOSIS — N179 Acute kidney failure, unspecified: Secondary | ICD-10-CM | POA: Diagnosis not present

## 2017-03-09 DIAGNOSIS — I129 Hypertensive chronic kidney disease with stage 1 through stage 4 chronic kidney disease, or unspecified chronic kidney disease: Secondary | ICD-10-CM | POA: Diagnosis not present

## 2017-03-09 DIAGNOSIS — G939 Disorder of brain, unspecified: Secondary | ICD-10-CM | POA: Diagnosis not present

## 2017-03-09 DIAGNOSIS — Z794 Long term (current) use of insulin: Secondary | ICD-10-CM | POA: Diagnosis not present

## 2017-03-09 DIAGNOSIS — E1122 Type 2 diabetes mellitus with diabetic chronic kidney disease: Secondary | ICD-10-CM | POA: Diagnosis not present

## 2017-03-09 DIAGNOSIS — M25561 Pain in right knee: Secondary | ICD-10-CM | POA: Diagnosis not present

## 2017-03-09 DIAGNOSIS — D509 Iron deficiency anemia, unspecified: Secondary | ICD-10-CM | POA: Diagnosis not present

## 2017-03-09 DIAGNOSIS — Z9181 History of falling: Secondary | ICD-10-CM | POA: Diagnosis not present

## 2017-03-09 DIAGNOSIS — M25562 Pain in left knee: Secondary | ICD-10-CM | POA: Diagnosis not present

## 2017-03-09 DIAGNOSIS — N184 Chronic kidney disease, stage 4 (severe): Secondary | ICD-10-CM | POA: Diagnosis not present

## 2017-03-10 DIAGNOSIS — G939 Disorder of brain, unspecified: Secondary | ICD-10-CM | POA: Diagnosis not present

## 2017-03-10 DIAGNOSIS — E1122 Type 2 diabetes mellitus with diabetic chronic kidney disease: Secondary | ICD-10-CM | POA: Diagnosis not present

## 2017-03-10 DIAGNOSIS — D509 Iron deficiency anemia, unspecified: Secondary | ICD-10-CM | POA: Diagnosis not present

## 2017-03-10 DIAGNOSIS — M25562 Pain in left knee: Secondary | ICD-10-CM | POA: Diagnosis not present

## 2017-03-10 DIAGNOSIS — Z794 Long term (current) use of insulin: Secondary | ICD-10-CM | POA: Diagnosis not present

## 2017-03-10 DIAGNOSIS — I129 Hypertensive chronic kidney disease with stage 1 through stage 4 chronic kidney disease, or unspecified chronic kidney disease: Secondary | ICD-10-CM | POA: Diagnosis not present

## 2017-03-10 DIAGNOSIS — N179 Acute kidney failure, unspecified: Secondary | ICD-10-CM | POA: Diagnosis not present

## 2017-03-10 DIAGNOSIS — N184 Chronic kidney disease, stage 4 (severe): Secondary | ICD-10-CM | POA: Diagnosis not present

## 2017-03-10 DIAGNOSIS — M25561 Pain in right knee: Secondary | ICD-10-CM | POA: Diagnosis not present

## 2017-03-10 DIAGNOSIS — Z9181 History of falling: Secondary | ICD-10-CM | POA: Diagnosis not present

## 2017-03-11 DIAGNOSIS — N184 Chronic kidney disease, stage 4 (severe): Secondary | ICD-10-CM | POA: Diagnosis not present

## 2017-03-11 DIAGNOSIS — Z9181 History of falling: Secondary | ICD-10-CM | POA: Diagnosis not present

## 2017-03-11 DIAGNOSIS — D509 Iron deficiency anemia, unspecified: Secondary | ICD-10-CM | POA: Diagnosis not present

## 2017-03-11 DIAGNOSIS — Z794 Long term (current) use of insulin: Secondary | ICD-10-CM | POA: Diagnosis not present

## 2017-03-11 DIAGNOSIS — G939 Disorder of brain, unspecified: Secondary | ICD-10-CM | POA: Diagnosis not present

## 2017-03-11 DIAGNOSIS — N179 Acute kidney failure, unspecified: Secondary | ICD-10-CM | POA: Diagnosis not present

## 2017-03-11 DIAGNOSIS — I129 Hypertensive chronic kidney disease with stage 1 through stage 4 chronic kidney disease, or unspecified chronic kidney disease: Secondary | ICD-10-CM | POA: Diagnosis not present

## 2017-03-11 DIAGNOSIS — M25562 Pain in left knee: Secondary | ICD-10-CM | POA: Diagnosis not present

## 2017-03-11 DIAGNOSIS — M25561 Pain in right knee: Secondary | ICD-10-CM | POA: Diagnosis not present

## 2017-03-11 DIAGNOSIS — E1122 Type 2 diabetes mellitus with diabetic chronic kidney disease: Secondary | ICD-10-CM | POA: Diagnosis not present

## 2017-03-16 DIAGNOSIS — Z9181 History of falling: Secondary | ICD-10-CM | POA: Diagnosis not present

## 2017-03-16 DIAGNOSIS — N184 Chronic kidney disease, stage 4 (severe): Secondary | ICD-10-CM | POA: Diagnosis not present

## 2017-03-16 DIAGNOSIS — G939 Disorder of brain, unspecified: Secondary | ICD-10-CM | POA: Diagnosis not present

## 2017-03-16 DIAGNOSIS — D509 Iron deficiency anemia, unspecified: Secondary | ICD-10-CM | POA: Diagnosis not present

## 2017-03-16 DIAGNOSIS — M25562 Pain in left knee: Secondary | ICD-10-CM | POA: Diagnosis not present

## 2017-03-16 DIAGNOSIS — E1122 Type 2 diabetes mellitus with diabetic chronic kidney disease: Secondary | ICD-10-CM | POA: Diagnosis not present

## 2017-03-16 DIAGNOSIS — I129 Hypertensive chronic kidney disease with stage 1 through stage 4 chronic kidney disease, or unspecified chronic kidney disease: Secondary | ICD-10-CM | POA: Diagnosis not present

## 2017-03-16 DIAGNOSIS — N179 Acute kidney failure, unspecified: Secondary | ICD-10-CM | POA: Diagnosis not present

## 2017-03-16 DIAGNOSIS — M25561 Pain in right knee: Secondary | ICD-10-CM | POA: Diagnosis not present

## 2017-03-16 DIAGNOSIS — Z794 Long term (current) use of insulin: Secondary | ICD-10-CM | POA: Diagnosis not present

## 2017-03-17 ENCOUNTER — Telehealth: Payer: Self-pay | Admitting: Family Medicine

## 2017-03-17 DIAGNOSIS — N184 Chronic kidney disease, stage 4 (severe): Secondary | ICD-10-CM | POA: Diagnosis not present

## 2017-03-17 DIAGNOSIS — I129 Hypertensive chronic kidney disease with stage 1 through stage 4 chronic kidney disease, or unspecified chronic kidney disease: Secondary | ICD-10-CM | POA: Diagnosis not present

## 2017-03-17 DIAGNOSIS — Z794 Long term (current) use of insulin: Secondary | ICD-10-CM | POA: Diagnosis not present

## 2017-03-17 DIAGNOSIS — D509 Iron deficiency anemia, unspecified: Secondary | ICD-10-CM | POA: Diagnosis not present

## 2017-03-17 DIAGNOSIS — M25562 Pain in left knee: Secondary | ICD-10-CM | POA: Diagnosis not present

## 2017-03-17 DIAGNOSIS — Z9181 History of falling: Secondary | ICD-10-CM | POA: Diagnosis not present

## 2017-03-17 DIAGNOSIS — M25561 Pain in right knee: Secondary | ICD-10-CM | POA: Diagnosis not present

## 2017-03-17 DIAGNOSIS — N179 Acute kidney failure, unspecified: Secondary | ICD-10-CM | POA: Diagnosis not present

## 2017-03-17 DIAGNOSIS — E1122 Type 2 diabetes mellitus with diabetic chronic kidney disease: Secondary | ICD-10-CM | POA: Diagnosis not present

## 2017-03-17 DIAGNOSIS — G939 Disorder of brain, unspecified: Secondary | ICD-10-CM | POA: Diagnosis not present

## 2017-03-17 NOTE — Telephone Encounter (Signed)
Pt was ill during his no show visit on 10/23/16. He went into the hospital, [facility] then [facility] shortly after the no show.

## 2017-03-18 DIAGNOSIS — G939 Disorder of brain, unspecified: Secondary | ICD-10-CM | POA: Diagnosis not present

## 2017-03-18 DIAGNOSIS — M25561 Pain in right knee: Secondary | ICD-10-CM | POA: Diagnosis not present

## 2017-03-18 DIAGNOSIS — D509 Iron deficiency anemia, unspecified: Secondary | ICD-10-CM | POA: Diagnosis not present

## 2017-03-18 DIAGNOSIS — Z9181 History of falling: Secondary | ICD-10-CM | POA: Diagnosis not present

## 2017-03-18 DIAGNOSIS — Z794 Long term (current) use of insulin: Secondary | ICD-10-CM | POA: Diagnosis not present

## 2017-03-18 DIAGNOSIS — N179 Acute kidney failure, unspecified: Secondary | ICD-10-CM | POA: Diagnosis not present

## 2017-03-18 DIAGNOSIS — I129 Hypertensive chronic kidney disease with stage 1 through stage 4 chronic kidney disease, or unspecified chronic kidney disease: Secondary | ICD-10-CM | POA: Diagnosis not present

## 2017-03-18 DIAGNOSIS — E1122 Type 2 diabetes mellitus with diabetic chronic kidney disease: Secondary | ICD-10-CM | POA: Diagnosis not present

## 2017-03-18 DIAGNOSIS — N184 Chronic kidney disease, stage 4 (severe): Secondary | ICD-10-CM | POA: Diagnosis not present

## 2017-03-18 DIAGNOSIS — M25562 Pain in left knee: Secondary | ICD-10-CM | POA: Diagnosis not present

## 2017-03-24 DIAGNOSIS — M25562 Pain in left knee: Secondary | ICD-10-CM | POA: Diagnosis not present

## 2017-03-24 DIAGNOSIS — Z9181 History of falling: Secondary | ICD-10-CM | POA: Diagnosis not present

## 2017-03-24 DIAGNOSIS — Z794 Long term (current) use of insulin: Secondary | ICD-10-CM | POA: Diagnosis not present

## 2017-03-24 DIAGNOSIS — G939 Disorder of brain, unspecified: Secondary | ICD-10-CM | POA: Diagnosis not present

## 2017-03-24 DIAGNOSIS — N179 Acute kidney failure, unspecified: Secondary | ICD-10-CM | POA: Diagnosis not present

## 2017-03-24 DIAGNOSIS — E1122 Type 2 diabetes mellitus with diabetic chronic kidney disease: Secondary | ICD-10-CM | POA: Diagnosis not present

## 2017-03-24 DIAGNOSIS — I129 Hypertensive chronic kidney disease with stage 1 through stage 4 chronic kidney disease, or unspecified chronic kidney disease: Secondary | ICD-10-CM | POA: Diagnosis not present

## 2017-03-24 DIAGNOSIS — D509 Iron deficiency anemia, unspecified: Secondary | ICD-10-CM | POA: Diagnosis not present

## 2017-03-24 DIAGNOSIS — M25561 Pain in right knee: Secondary | ICD-10-CM | POA: Diagnosis not present

## 2017-03-24 DIAGNOSIS — N184 Chronic kidney disease, stage 4 (severe): Secondary | ICD-10-CM | POA: Diagnosis not present

## 2017-03-26 DIAGNOSIS — I129 Hypertensive chronic kidney disease with stage 1 through stage 4 chronic kidney disease, or unspecified chronic kidney disease: Secondary | ICD-10-CM | POA: Diagnosis not present

## 2017-03-26 DIAGNOSIS — M25562 Pain in left knee: Secondary | ICD-10-CM | POA: Diagnosis not present

## 2017-03-26 DIAGNOSIS — N184 Chronic kidney disease, stage 4 (severe): Secondary | ICD-10-CM | POA: Diagnosis not present

## 2017-03-26 DIAGNOSIS — Z9181 History of falling: Secondary | ICD-10-CM | POA: Diagnosis not present

## 2017-03-26 DIAGNOSIS — N179 Acute kidney failure, unspecified: Secondary | ICD-10-CM | POA: Diagnosis not present

## 2017-03-26 DIAGNOSIS — E1122 Type 2 diabetes mellitus with diabetic chronic kidney disease: Secondary | ICD-10-CM | POA: Diagnosis not present

## 2017-03-26 DIAGNOSIS — G939 Disorder of brain, unspecified: Secondary | ICD-10-CM | POA: Diagnosis not present

## 2017-03-26 DIAGNOSIS — M25561 Pain in right knee: Secondary | ICD-10-CM | POA: Diagnosis not present

## 2017-03-26 DIAGNOSIS — Z794 Long term (current) use of insulin: Secondary | ICD-10-CM | POA: Diagnosis not present

## 2017-03-26 DIAGNOSIS — D509 Iron deficiency anemia, unspecified: Secondary | ICD-10-CM | POA: Diagnosis not present

## 2017-03-27 DIAGNOSIS — Z794 Long term (current) use of insulin: Secondary | ICD-10-CM | POA: Diagnosis not present

## 2017-03-27 DIAGNOSIS — I129 Hypertensive chronic kidney disease with stage 1 through stage 4 chronic kidney disease, or unspecified chronic kidney disease: Secondary | ICD-10-CM | POA: Diagnosis not present

## 2017-03-27 DIAGNOSIS — M25561 Pain in right knee: Secondary | ICD-10-CM | POA: Diagnosis not present

## 2017-03-27 DIAGNOSIS — M25562 Pain in left knee: Secondary | ICD-10-CM | POA: Diagnosis not present

## 2017-03-27 DIAGNOSIS — D509 Iron deficiency anemia, unspecified: Secondary | ICD-10-CM | POA: Diagnosis not present

## 2017-03-27 DIAGNOSIS — N179 Acute kidney failure, unspecified: Secondary | ICD-10-CM | POA: Diagnosis not present

## 2017-03-27 DIAGNOSIS — Z9181 History of falling: Secondary | ICD-10-CM | POA: Diagnosis not present

## 2017-03-27 DIAGNOSIS — E1122 Type 2 diabetes mellitus with diabetic chronic kidney disease: Secondary | ICD-10-CM | POA: Diagnosis not present

## 2017-03-27 DIAGNOSIS — G939 Disorder of brain, unspecified: Secondary | ICD-10-CM | POA: Diagnosis not present

## 2017-03-27 DIAGNOSIS — N184 Chronic kidney disease, stage 4 (severe): Secondary | ICD-10-CM | POA: Diagnosis not present

## 2017-03-30 DIAGNOSIS — M25561 Pain in right knee: Secondary | ICD-10-CM | POA: Diagnosis not present

## 2017-03-30 DIAGNOSIS — Z794 Long term (current) use of insulin: Secondary | ICD-10-CM | POA: Diagnosis not present

## 2017-03-30 DIAGNOSIS — Z9181 History of falling: Secondary | ICD-10-CM | POA: Diagnosis not present

## 2017-03-30 DIAGNOSIS — D509 Iron deficiency anemia, unspecified: Secondary | ICD-10-CM | POA: Diagnosis not present

## 2017-03-30 DIAGNOSIS — G939 Disorder of brain, unspecified: Secondary | ICD-10-CM | POA: Diagnosis not present

## 2017-03-30 DIAGNOSIS — N184 Chronic kidney disease, stage 4 (severe): Secondary | ICD-10-CM | POA: Diagnosis not present

## 2017-03-30 DIAGNOSIS — E1122 Type 2 diabetes mellitus with diabetic chronic kidney disease: Secondary | ICD-10-CM | POA: Diagnosis not present

## 2017-03-30 DIAGNOSIS — M25562 Pain in left knee: Secondary | ICD-10-CM | POA: Diagnosis not present

## 2017-03-30 DIAGNOSIS — I129 Hypertensive chronic kidney disease with stage 1 through stage 4 chronic kidney disease, or unspecified chronic kidney disease: Secondary | ICD-10-CM | POA: Diagnosis not present

## 2017-03-30 DIAGNOSIS — N179 Acute kidney failure, unspecified: Secondary | ICD-10-CM | POA: Diagnosis not present

## 2017-03-31 DIAGNOSIS — Z9181 History of falling: Secondary | ICD-10-CM | POA: Diagnosis not present

## 2017-03-31 DIAGNOSIS — G939 Disorder of brain, unspecified: Secondary | ICD-10-CM | POA: Diagnosis not present

## 2017-03-31 DIAGNOSIS — D509 Iron deficiency anemia, unspecified: Secondary | ICD-10-CM | POA: Diagnosis not present

## 2017-03-31 DIAGNOSIS — N179 Acute kidney failure, unspecified: Secondary | ICD-10-CM | POA: Diagnosis not present

## 2017-03-31 DIAGNOSIS — Z794 Long term (current) use of insulin: Secondary | ICD-10-CM | POA: Diagnosis not present

## 2017-03-31 DIAGNOSIS — M25561 Pain in right knee: Secondary | ICD-10-CM | POA: Diagnosis not present

## 2017-03-31 DIAGNOSIS — N184 Chronic kidney disease, stage 4 (severe): Secondary | ICD-10-CM | POA: Diagnosis not present

## 2017-03-31 DIAGNOSIS — I129 Hypertensive chronic kidney disease with stage 1 through stage 4 chronic kidney disease, or unspecified chronic kidney disease: Secondary | ICD-10-CM | POA: Diagnosis not present

## 2017-03-31 DIAGNOSIS — E1122 Type 2 diabetes mellitus with diabetic chronic kidney disease: Secondary | ICD-10-CM | POA: Diagnosis not present

## 2017-03-31 DIAGNOSIS — M25562 Pain in left knee: Secondary | ICD-10-CM | POA: Diagnosis not present

## 2017-04-01 DIAGNOSIS — Z794 Long term (current) use of insulin: Secondary | ICD-10-CM | POA: Diagnosis not present

## 2017-04-01 DIAGNOSIS — I129 Hypertensive chronic kidney disease with stage 1 through stage 4 chronic kidney disease, or unspecified chronic kidney disease: Secondary | ICD-10-CM | POA: Diagnosis not present

## 2017-04-01 DIAGNOSIS — N179 Acute kidney failure, unspecified: Secondary | ICD-10-CM | POA: Diagnosis not present

## 2017-04-01 DIAGNOSIS — M25561 Pain in right knee: Secondary | ICD-10-CM | POA: Diagnosis not present

## 2017-04-01 DIAGNOSIS — G939 Disorder of brain, unspecified: Secondary | ICD-10-CM | POA: Diagnosis not present

## 2017-04-01 DIAGNOSIS — Z9181 History of falling: Secondary | ICD-10-CM | POA: Diagnosis not present

## 2017-04-01 DIAGNOSIS — E1122 Type 2 diabetes mellitus with diabetic chronic kidney disease: Secondary | ICD-10-CM | POA: Diagnosis not present

## 2017-04-01 DIAGNOSIS — D509 Iron deficiency anemia, unspecified: Secondary | ICD-10-CM | POA: Diagnosis not present

## 2017-04-01 DIAGNOSIS — M25562 Pain in left knee: Secondary | ICD-10-CM | POA: Diagnosis not present

## 2017-04-01 DIAGNOSIS — N184 Chronic kidney disease, stage 4 (severe): Secondary | ICD-10-CM | POA: Diagnosis not present

## 2017-04-06 DIAGNOSIS — N179 Acute kidney failure, unspecified: Secondary | ICD-10-CM | POA: Diagnosis not present

## 2017-04-06 DIAGNOSIS — E1122 Type 2 diabetes mellitus with diabetic chronic kidney disease: Secondary | ICD-10-CM | POA: Diagnosis not present

## 2017-04-06 DIAGNOSIS — Z9181 History of falling: Secondary | ICD-10-CM | POA: Diagnosis not present

## 2017-04-06 DIAGNOSIS — M25562 Pain in left knee: Secondary | ICD-10-CM | POA: Diagnosis not present

## 2017-04-06 DIAGNOSIS — M25561 Pain in right knee: Secondary | ICD-10-CM | POA: Diagnosis not present

## 2017-04-06 DIAGNOSIS — I129 Hypertensive chronic kidney disease with stage 1 through stage 4 chronic kidney disease, or unspecified chronic kidney disease: Secondary | ICD-10-CM | POA: Diagnosis not present

## 2017-04-06 DIAGNOSIS — D509 Iron deficiency anemia, unspecified: Secondary | ICD-10-CM | POA: Diagnosis not present

## 2017-04-06 DIAGNOSIS — G939 Disorder of brain, unspecified: Secondary | ICD-10-CM | POA: Diagnosis not present

## 2017-04-06 DIAGNOSIS — N184 Chronic kidney disease, stage 4 (severe): Secondary | ICD-10-CM | POA: Diagnosis not present

## 2017-04-06 DIAGNOSIS — Z794 Long term (current) use of insulin: Secondary | ICD-10-CM | POA: Diagnosis not present

## 2017-04-08 DIAGNOSIS — I129 Hypertensive chronic kidney disease with stage 1 through stage 4 chronic kidney disease, or unspecified chronic kidney disease: Secondary | ICD-10-CM | POA: Diagnosis not present

## 2017-04-08 DIAGNOSIS — E1122 Type 2 diabetes mellitus with diabetic chronic kidney disease: Secondary | ICD-10-CM | POA: Diagnosis not present

## 2017-04-08 DIAGNOSIS — D509 Iron deficiency anemia, unspecified: Secondary | ICD-10-CM | POA: Diagnosis not present

## 2017-04-08 DIAGNOSIS — N179 Acute kidney failure, unspecified: Secondary | ICD-10-CM | POA: Diagnosis not present

## 2017-04-08 DIAGNOSIS — Z9181 History of falling: Secondary | ICD-10-CM | POA: Diagnosis not present

## 2017-04-08 DIAGNOSIS — M25561 Pain in right knee: Secondary | ICD-10-CM | POA: Diagnosis not present

## 2017-04-08 DIAGNOSIS — G939 Disorder of brain, unspecified: Secondary | ICD-10-CM | POA: Diagnosis not present

## 2017-04-08 DIAGNOSIS — Z794 Long term (current) use of insulin: Secondary | ICD-10-CM | POA: Diagnosis not present

## 2017-04-08 DIAGNOSIS — M25562 Pain in left knee: Secondary | ICD-10-CM | POA: Diagnosis not present

## 2017-04-08 DIAGNOSIS — N184 Chronic kidney disease, stage 4 (severe): Secondary | ICD-10-CM | POA: Diagnosis not present

## 2017-04-15 DIAGNOSIS — E1122 Type 2 diabetes mellitus with diabetic chronic kidney disease: Secondary | ICD-10-CM | POA: Diagnosis not present

## 2017-04-15 DIAGNOSIS — Z794 Long term (current) use of insulin: Secondary | ICD-10-CM | POA: Diagnosis not present

## 2017-04-15 DIAGNOSIS — Z9181 History of falling: Secondary | ICD-10-CM | POA: Diagnosis not present

## 2017-04-15 DIAGNOSIS — I129 Hypertensive chronic kidney disease with stage 1 through stage 4 chronic kidney disease, or unspecified chronic kidney disease: Secondary | ICD-10-CM | POA: Diagnosis not present

## 2017-04-15 DIAGNOSIS — N179 Acute kidney failure, unspecified: Secondary | ICD-10-CM | POA: Diagnosis not present

## 2017-04-15 DIAGNOSIS — M25562 Pain in left knee: Secondary | ICD-10-CM | POA: Diagnosis not present

## 2017-04-15 DIAGNOSIS — N184 Chronic kidney disease, stage 4 (severe): Secondary | ICD-10-CM | POA: Diagnosis not present

## 2017-04-15 DIAGNOSIS — G939 Disorder of brain, unspecified: Secondary | ICD-10-CM | POA: Diagnosis not present

## 2017-04-15 DIAGNOSIS — M25561 Pain in right knee: Secondary | ICD-10-CM | POA: Diagnosis not present

## 2017-04-15 DIAGNOSIS — D509 Iron deficiency anemia, unspecified: Secondary | ICD-10-CM | POA: Diagnosis not present

## 2017-04-17 DIAGNOSIS — M25562 Pain in left knee: Secondary | ICD-10-CM | POA: Diagnosis not present

## 2017-04-17 DIAGNOSIS — N184 Chronic kidney disease, stage 4 (severe): Secondary | ICD-10-CM | POA: Diagnosis not present

## 2017-04-17 DIAGNOSIS — M25561 Pain in right knee: Secondary | ICD-10-CM | POA: Diagnosis not present

## 2017-04-17 DIAGNOSIS — E1122 Type 2 diabetes mellitus with diabetic chronic kidney disease: Secondary | ICD-10-CM | POA: Diagnosis not present

## 2017-04-17 DIAGNOSIS — I129 Hypertensive chronic kidney disease with stage 1 through stage 4 chronic kidney disease, or unspecified chronic kidney disease: Secondary | ICD-10-CM | POA: Diagnosis not present

## 2017-04-17 DIAGNOSIS — Z9181 History of falling: Secondary | ICD-10-CM | POA: Diagnosis not present

## 2017-04-17 DIAGNOSIS — D509 Iron deficiency anemia, unspecified: Secondary | ICD-10-CM | POA: Diagnosis not present

## 2017-04-17 DIAGNOSIS — G939 Disorder of brain, unspecified: Secondary | ICD-10-CM | POA: Diagnosis not present

## 2017-04-17 DIAGNOSIS — N179 Acute kidney failure, unspecified: Secondary | ICD-10-CM | POA: Diagnosis not present

## 2017-04-17 DIAGNOSIS — Z794 Long term (current) use of insulin: Secondary | ICD-10-CM | POA: Diagnosis not present

## 2017-04-20 DIAGNOSIS — N179 Acute kidney failure, unspecified: Secondary | ICD-10-CM | POA: Diagnosis not present

## 2017-04-20 DIAGNOSIS — D509 Iron deficiency anemia, unspecified: Secondary | ICD-10-CM | POA: Diagnosis not present

## 2017-04-20 DIAGNOSIS — I129 Hypertensive chronic kidney disease with stage 1 through stage 4 chronic kidney disease, or unspecified chronic kidney disease: Secondary | ICD-10-CM | POA: Diagnosis not present

## 2017-04-20 DIAGNOSIS — M25562 Pain in left knee: Secondary | ICD-10-CM | POA: Diagnosis not present

## 2017-04-20 DIAGNOSIS — Z794 Long term (current) use of insulin: Secondary | ICD-10-CM | POA: Diagnosis not present

## 2017-04-20 DIAGNOSIS — E1122 Type 2 diabetes mellitus with diabetic chronic kidney disease: Secondary | ICD-10-CM | POA: Diagnosis not present

## 2017-04-20 DIAGNOSIS — G939 Disorder of brain, unspecified: Secondary | ICD-10-CM | POA: Diagnosis not present

## 2017-04-20 DIAGNOSIS — Z9181 History of falling: Secondary | ICD-10-CM | POA: Diagnosis not present

## 2017-04-20 DIAGNOSIS — M25561 Pain in right knee: Secondary | ICD-10-CM | POA: Diagnosis not present

## 2017-04-20 DIAGNOSIS — N184 Chronic kidney disease, stage 4 (severe): Secondary | ICD-10-CM | POA: Diagnosis not present

## 2017-04-22 DIAGNOSIS — G939 Disorder of brain, unspecified: Secondary | ICD-10-CM | POA: Diagnosis not present

## 2017-04-22 DIAGNOSIS — D509 Iron deficiency anemia, unspecified: Secondary | ICD-10-CM | POA: Diagnosis not present

## 2017-04-22 DIAGNOSIS — N179 Acute kidney failure, unspecified: Secondary | ICD-10-CM | POA: Diagnosis not present

## 2017-04-22 DIAGNOSIS — I129 Hypertensive chronic kidney disease with stage 1 through stage 4 chronic kidney disease, or unspecified chronic kidney disease: Secondary | ICD-10-CM | POA: Diagnosis not present

## 2017-04-22 DIAGNOSIS — E1122 Type 2 diabetes mellitus with diabetic chronic kidney disease: Secondary | ICD-10-CM | POA: Diagnosis not present

## 2017-04-22 DIAGNOSIS — Z794 Long term (current) use of insulin: Secondary | ICD-10-CM | POA: Diagnosis not present

## 2017-04-22 DIAGNOSIS — N184 Chronic kidney disease, stage 4 (severe): Secondary | ICD-10-CM | POA: Diagnosis not present

## 2017-04-22 DIAGNOSIS — M25561 Pain in right knee: Secondary | ICD-10-CM | POA: Diagnosis not present

## 2017-04-22 DIAGNOSIS — Z9181 History of falling: Secondary | ICD-10-CM | POA: Diagnosis not present

## 2017-04-22 DIAGNOSIS — M25562 Pain in left knee: Secondary | ICD-10-CM | POA: Diagnosis not present

## 2017-04-27 DIAGNOSIS — N179 Acute kidney failure, unspecified: Secondary | ICD-10-CM | POA: Diagnosis not present

## 2017-04-27 DIAGNOSIS — M25562 Pain in left knee: Secondary | ICD-10-CM | POA: Diagnosis not present

## 2017-04-27 DIAGNOSIS — Z9181 History of falling: Secondary | ICD-10-CM | POA: Diagnosis not present

## 2017-04-27 DIAGNOSIS — G939 Disorder of brain, unspecified: Secondary | ICD-10-CM | POA: Diagnosis not present

## 2017-04-27 DIAGNOSIS — M25561 Pain in right knee: Secondary | ICD-10-CM | POA: Diagnosis not present

## 2017-04-27 DIAGNOSIS — D509 Iron deficiency anemia, unspecified: Secondary | ICD-10-CM | POA: Diagnosis not present

## 2017-04-27 DIAGNOSIS — Z794 Long term (current) use of insulin: Secondary | ICD-10-CM | POA: Diagnosis not present

## 2017-04-27 DIAGNOSIS — I129 Hypertensive chronic kidney disease with stage 1 through stage 4 chronic kidney disease, or unspecified chronic kidney disease: Secondary | ICD-10-CM | POA: Diagnosis not present

## 2017-04-27 DIAGNOSIS — N184 Chronic kidney disease, stage 4 (severe): Secondary | ICD-10-CM | POA: Diagnosis not present

## 2017-04-27 DIAGNOSIS — E1122 Type 2 diabetes mellitus with diabetic chronic kidney disease: Secondary | ICD-10-CM | POA: Diagnosis not present

## 2017-04-30 DIAGNOSIS — Z794 Long term (current) use of insulin: Secondary | ICD-10-CM | POA: Diagnosis not present

## 2017-04-30 DIAGNOSIS — D509 Iron deficiency anemia, unspecified: Secondary | ICD-10-CM | POA: Diagnosis not present

## 2017-04-30 DIAGNOSIS — I129 Hypertensive chronic kidney disease with stage 1 through stage 4 chronic kidney disease, or unspecified chronic kidney disease: Secondary | ICD-10-CM | POA: Diagnosis not present

## 2017-04-30 DIAGNOSIS — Z9181 History of falling: Secondary | ICD-10-CM | POA: Diagnosis not present

## 2017-04-30 DIAGNOSIS — M25562 Pain in left knee: Secondary | ICD-10-CM | POA: Diagnosis not present

## 2017-04-30 DIAGNOSIS — G939 Disorder of brain, unspecified: Secondary | ICD-10-CM | POA: Diagnosis not present

## 2017-04-30 DIAGNOSIS — N179 Acute kidney failure, unspecified: Secondary | ICD-10-CM | POA: Diagnosis not present

## 2017-04-30 DIAGNOSIS — N184 Chronic kidney disease, stage 4 (severe): Secondary | ICD-10-CM | POA: Diagnosis not present

## 2017-04-30 DIAGNOSIS — M25561 Pain in right knee: Secondary | ICD-10-CM | POA: Diagnosis not present

## 2017-04-30 DIAGNOSIS — E1122 Type 2 diabetes mellitus with diabetic chronic kidney disease: Secondary | ICD-10-CM | POA: Diagnosis not present

## 2017-06-10 ENCOUNTER — Emergency Department (HOSPITAL_COMMUNITY): Payer: Medicare Other

## 2017-06-10 ENCOUNTER — Inpatient Hospital Stay (HOSPITAL_COMMUNITY)
Admission: EM | Admit: 2017-06-10 | Discharge: 2017-06-19 | DRG: 871 | Disposition: A | Discharge status: Hospice | Payer: Medicare Other | Attending: Family Medicine | Admitting: Family Medicine

## 2017-06-10 ENCOUNTER — Encounter (HOSPITAL_COMMUNITY): Payer: Self-pay

## 2017-06-10 DIAGNOSIS — J90 Pleural effusion, not elsewhere classified: Secondary | ICD-10-CM | POA: Diagnosis not present

## 2017-06-10 DIAGNOSIS — Z9889 Other specified postprocedural states: Secondary | ICD-10-CM

## 2017-06-10 DIAGNOSIS — M1990 Unspecified osteoarthritis, unspecified site: Secondary | ICD-10-CM | POA: Diagnosis present

## 2017-06-10 DIAGNOSIS — E872 Acidosis: Secondary | ICD-10-CM | POA: Diagnosis not present

## 2017-06-10 DIAGNOSIS — R55 Syncope and collapse: Secondary | ICD-10-CM | POA: Diagnosis not present

## 2017-06-10 DIAGNOSIS — E870 Hyperosmolality and hypernatremia: Secondary | ICD-10-CM | POA: Diagnosis present

## 2017-06-10 DIAGNOSIS — G9341 Metabolic encephalopathy: Secondary | ICD-10-CM | POA: Diagnosis not present

## 2017-06-10 DIAGNOSIS — H9190 Unspecified hearing loss, unspecified ear: Secondary | ICD-10-CM | POA: Diagnosis not present

## 2017-06-10 DIAGNOSIS — I469 Cardiac arrest, cause unspecified: Secondary | ICD-10-CM | POA: Diagnosis not present

## 2017-06-10 DIAGNOSIS — R609 Edema, unspecified: Secondary | ICD-10-CM | POA: Diagnosis not present

## 2017-06-10 DIAGNOSIS — N179 Acute kidney failure, unspecified: Secondary | ICD-10-CM

## 2017-06-10 DIAGNOSIS — Z794 Long term (current) use of insulin: Secondary | ICD-10-CM

## 2017-06-10 DIAGNOSIS — I10 Essential (primary) hypertension: Secondary | ICD-10-CM

## 2017-06-10 DIAGNOSIS — G939 Disorder of brain, unspecified: Secondary | ICD-10-CM | POA: Diagnosis not present

## 2017-06-10 DIAGNOSIS — R001 Bradycardia, unspecified: Secondary | ICD-10-CM | POA: Diagnosis not present

## 2017-06-10 DIAGNOSIS — K219 Gastro-esophageal reflux disease without esophagitis: Secondary | ICD-10-CM

## 2017-06-10 DIAGNOSIS — I120 Hypertensive chronic kidney disease with stage 5 chronic kidney disease or end stage renal disease: Secondary | ICD-10-CM | POA: Diagnosis not present

## 2017-06-10 DIAGNOSIS — K5792 Diverticulitis of intestine, part unspecified, without perforation or abscess without bleeding: Secondary | ICD-10-CM | POA: Diagnosis present

## 2017-06-10 DIAGNOSIS — T68XXXA Hypothermia, initial encounter: Secondary | ICD-10-CM

## 2017-06-10 DIAGNOSIS — D649 Anemia, unspecified: Secondary | ICD-10-CM | POA: Diagnosis present

## 2017-06-10 DIAGNOSIS — E875 Hyperkalemia: Secondary | ICD-10-CM | POA: Diagnosis not present

## 2017-06-10 DIAGNOSIS — Z87891 Personal history of nicotine dependence: Secondary | ICD-10-CM

## 2017-06-10 DIAGNOSIS — N184 Chronic kidney disease, stage 4 (severe): Secondary | ICD-10-CM

## 2017-06-10 DIAGNOSIS — E785 Hyperlipidemia, unspecified: Secondary | ICD-10-CM | POA: Diagnosis not present

## 2017-06-10 DIAGNOSIS — R0602 Shortness of breath: Secondary | ICD-10-CM

## 2017-06-10 DIAGNOSIS — I959 Hypotension, unspecified: Secondary | ICD-10-CM | POA: Diagnosis not present

## 2017-06-10 DIAGNOSIS — N390 Urinary tract infection, site not specified: Secondary | ICD-10-CM | POA: Diagnosis present

## 2017-06-10 DIAGNOSIS — Z515 Encounter for palliative care: Secondary | ICD-10-CM

## 2017-06-10 DIAGNOSIS — E1122 Type 2 diabetes mellitus with diabetic chronic kidney disease: Secondary | ICD-10-CM | POA: Diagnosis present

## 2017-06-10 DIAGNOSIS — G9389 Other specified disorders of brain: Secondary | ICD-10-CM

## 2017-06-10 DIAGNOSIS — F0390 Unspecified dementia without behavioral disturbance: Secondary | ICD-10-CM | POA: Diagnosis present

## 2017-06-10 DIAGNOSIS — E860 Dehydration: Secondary | ICD-10-CM | POA: Diagnosis not present

## 2017-06-10 DIAGNOSIS — K5732 Diverticulitis of large intestine without perforation or abscess without bleeding: Secondary | ICD-10-CM

## 2017-06-10 DIAGNOSIS — A419 Sepsis, unspecified organism: Principal | ICD-10-CM | POA: Diagnosis present

## 2017-06-10 DIAGNOSIS — Z833 Family history of diabetes mellitus: Secondary | ICD-10-CM

## 2017-06-10 DIAGNOSIS — E119 Type 2 diabetes mellitus without complications: Secondary | ICD-10-CM | POA: Diagnosis not present

## 2017-06-10 DIAGNOSIS — T68XXXD Hypothermia, subsequent encounter: Secondary | ICD-10-CM

## 2017-06-10 DIAGNOSIS — Z79899 Other long term (current) drug therapy: Secondary | ICD-10-CM

## 2017-06-10 DIAGNOSIS — N186 End stage renal disease: Secondary | ICD-10-CM | POA: Diagnosis present

## 2017-06-10 DIAGNOSIS — Z66 Do not resuscitate: Secondary | ICD-10-CM

## 2017-06-10 DIAGNOSIS — N185 Chronic kidney disease, stage 5: Secondary | ICD-10-CM | POA: Diagnosis not present

## 2017-06-10 DIAGNOSIS — R4182 Altered mental status, unspecified: Secondary | ICD-10-CM

## 2017-06-10 DIAGNOSIS — N400 Enlarged prostate without lower urinary tract symptoms: Secondary | ICD-10-CM | POA: Diagnosis present

## 2017-06-10 DIAGNOSIS — R404 Transient alteration of awareness: Secondary | ICD-10-CM | POA: Diagnosis not present

## 2017-06-10 DIAGNOSIS — Z9181 History of falling: Secondary | ICD-10-CM

## 2017-06-10 DIAGNOSIS — D631 Anemia in chronic kidney disease: Secondary | ICD-10-CM | POA: Diagnosis present

## 2017-06-10 DIAGNOSIS — R600 Localized edema: Secondary | ICD-10-CM | POA: Diagnosis present

## 2017-06-10 DIAGNOSIS — E7800 Pure hypercholesterolemia, unspecified: Secondary | ICD-10-CM

## 2017-06-10 DIAGNOSIS — I361 Nonrheumatic tricuspid (valve) insufficiency: Secondary | ICD-10-CM | POA: Diagnosis not present

## 2017-06-10 DIAGNOSIS — R140 Abdominal distension (gaseous): Secondary | ICD-10-CM | POA: Diagnosis present

## 2017-06-10 DIAGNOSIS — E1129 Type 2 diabetes mellitus with other diabetic kidney complication: Secondary | ICD-10-CM | POA: Diagnosis present

## 2017-06-10 DIAGNOSIS — N170 Acute kidney failure with tubular necrosis: Secondary | ICD-10-CM | POA: Diagnosis not present

## 2017-06-10 LAB — I-STAT CG4 LACTIC ACID, ED
Lactic Acid, Venous: 0.8 mmol/L (ref 0.5–1.9)
Lactic Acid, Venous: 1.31 mmol/L (ref 0.5–1.9)

## 2017-06-10 LAB — COMPREHENSIVE METABOLIC PANEL
ALBUMIN: 3.2 g/dL — AB (ref 3.5–5.0)
ALK PHOS: 71 U/L (ref 38–126)
ALT: 29 U/L (ref 17–63)
AST: 31 U/L (ref 15–41)
Anion gap: 8 (ref 5–15)
BILIRUBIN TOTAL: 0.4 mg/dL (ref 0.3–1.2)
BUN: 84 mg/dL — AB (ref 6–20)
CALCIUM: 9 mg/dL (ref 8.9–10.3)
CO2: 17 mmol/L — ABNORMAL LOW (ref 22–32)
CREATININE: 4.1 mg/dL — AB (ref 0.61–1.24)
Chloride: 121 mmol/L — ABNORMAL HIGH (ref 101–111)
GFR calc Af Amer: 14 mL/min — ABNORMAL LOW (ref >60)
GFR calc non Af Amer: 12 mL/min — ABNORMAL LOW (ref >60)
GLUCOSE: 116 mg/dL — AB (ref 65–99)
POTASSIUM: 5.2 mmol/L — AB (ref 3.5–5.1)
Sodium: 146 mmol/L — ABNORMAL HIGH (ref 135–145)
TOTAL PROTEIN: 6.7 g/dL (ref 6.5–8.1)

## 2017-06-10 LAB — CBC WITH DIFFERENTIAL/PLATELET
BASOS PCT: 0 %
Basophils Absolute: 0 10*3/uL (ref 0.0–0.1)
Eosinophils Absolute: 0.1 10*3/uL (ref 0.0–0.7)
Eosinophils Relative: 2 %
HEMATOCRIT: 23.6 % — AB (ref 39.0–52.0)
HEMOGLOBIN: 7.5 g/dL — AB (ref 13.0–17.0)
Lymphocytes Relative: 13 %
Lymphs Abs: 0.9 10*3/uL (ref 0.7–4.0)
MCH: 28.6 pg (ref 26.0–34.0)
MCHC: 31.8 g/dL (ref 30.0–36.0)
MCV: 90.1 fL (ref 78.0–100.0)
Monocytes Absolute: 0.9 10*3/uL (ref 0.1–1.0)
Monocytes Relative: 13 %
NEUTROS ABS: 4.9 10*3/uL (ref 1.7–7.7)
Neutrophils Relative %: 72 %
Platelets: 181 10*3/uL (ref 150–400)
RBC: 2.62 MIL/uL — ABNORMAL LOW (ref 4.22–5.81)
RDW: 19 % — ABNORMAL HIGH (ref 11.5–15.5)
WBC: 6.8 10*3/uL (ref 4.0–10.5)

## 2017-06-10 LAB — I-STAT CHEM 8, ED
BUN: 81 mg/dL — AB (ref 6–20)
CALCIUM ION: 1.2 mmol/L (ref 1.15–1.40)
CHLORIDE: 121 mmol/L — AB (ref 101–111)
Creatinine, Ser: 4.3 mg/dL — ABNORMAL HIGH (ref 0.61–1.24)
Glucose, Bld: 109 mg/dL — ABNORMAL HIGH (ref 65–99)
HCT: 22 % — ABNORMAL LOW (ref 39.0–52.0)
Hemoglobin: 7.5 g/dL — ABNORMAL LOW (ref 13.0–17.0)
Potassium: 5.2 mmol/L — ABNORMAL HIGH (ref 3.5–5.1)
Sodium: 146 mmol/L — ABNORMAL HIGH (ref 135–145)
TCO2: 16 mmol/L (ref 0–100)

## 2017-06-10 LAB — URINALYSIS, ROUTINE W REFLEX MICROSCOPIC
BILIRUBIN URINE: NEGATIVE
Glucose, UA: NEGATIVE mg/dL
HGB URINE DIPSTICK: NEGATIVE
Ketones, ur: NEGATIVE mg/dL
Nitrite: NEGATIVE
Protein, ur: 30 mg/dL — AB
SPECIFIC GRAVITY, URINE: 1.014 (ref 1.005–1.030)
Squamous Epithelial / LPF: NONE SEEN
pH: 5 (ref 5.0–8.0)

## 2017-06-10 LAB — POC OCCULT BLOOD, ED: FECAL OCCULT BLD: NEGATIVE

## 2017-06-10 LAB — PROTIME-INR
INR: 1.36
Prothrombin Time: 16.9 seconds — ABNORMAL HIGH (ref 11.4–15.2)

## 2017-06-10 LAB — BRAIN NATRIURETIC PEPTIDE: B NATRIURETIC PEPTIDE 5: 361.3 pg/mL — AB (ref 0.0–100.0)

## 2017-06-10 LAB — TSH: TSH: 6.361 u[IU]/mL — ABNORMAL HIGH (ref 0.350–4.500)

## 2017-06-10 MED ORDER — PIPERACILLIN-TAZOBACTAM 3.375 G IVPB 30 MIN
3.3750 g | Freq: Once | INTRAVENOUS | Status: AC
  Administered 2017-06-10: 3.375 g via INTRAVENOUS
  Filled 2017-06-10: qty 50

## 2017-06-10 MED ORDER — HYDROCORTISONE NA SUCCINATE PF 100 MG IJ SOLR
100.0000 mg | Freq: Once | INTRAMUSCULAR | Status: AC
  Administered 2017-06-11: 100 mg via INTRAVENOUS
  Filled 2017-06-10: qty 2

## 2017-06-10 MED ORDER — INSULIN ASPART 100 UNIT/ML ~~LOC~~ SOLN: 0.0000 [IU] | Freq: Every day | SUBCUTANEOUS | Status: DC

## 2017-06-10 MED ORDER — SODIUM POLYSTYRENE SULFONATE 15 GM/60ML PO SUSP
45.0000 g | Freq: Once | ORAL | Status: AC
  Administered 2017-06-11: 45 g via RECTAL
  Filled 2017-06-10: qty 180

## 2017-06-10 MED ORDER — FAMOTIDINE IN NACL 20-0.9 MG/50ML-% IV SOLN
20.0000 mg | Freq: Two times a day (BID) | INTRAVENOUS | Status: DC
  Administered 2017-06-11 – 2017-06-12 (×3): 20 mg via INTRAVENOUS
  Filled 2017-06-10 (×4): qty 50

## 2017-06-10 MED ORDER — INSULIN ASPART 100 UNIT/ML ~~LOC~~ SOLN
0.0000 [IU] | Freq: Three times a day (TID) | SUBCUTANEOUS | Status: DC
  Administered 2017-06-11 – 2017-06-18 (×5): 1 [IU] via SUBCUTANEOUS
  Filled 2017-06-10 (×2): qty 1

## 2017-06-10 MED ORDER — SODIUM CHLORIDE 0.9 % IV BOLUS (SEPSIS)
1000.0000 mL | Freq: Once | INTRAVENOUS | Status: AC
Start: 2017-06-10 — End: 2017-06-10
  Administered 2017-06-10: 1000 mL via INTRAVENOUS

## 2017-06-10 MED ORDER — SODIUM CHLORIDE 0.9 % IV BOLUS (SEPSIS)
1000.0000 mL | Freq: Once | INTRAVENOUS | Status: AC
  Administered 2017-06-10: 1000 mL via INTRAVENOUS

## 2017-06-10 MED ORDER — ACETAMINOPHEN 325 MG PO TABS: 650.0000 mg | ORAL_TABLET | Freq: Four times a day (QID) | ORAL | Status: DC | PRN

## 2017-06-10 MED ORDER — SODIUM CHLORIDE 0.9 % IV BOLUS (SEPSIS)
500.0000 mL | Freq: Once | INTRAVENOUS | Status: AC
  Administered 2017-06-10: 500 mL via INTRAVENOUS

## 2017-06-10 MED ORDER — ENOXAPARIN SODIUM 30 MG/0.3ML ~~LOC~~ SOLN: 30.0000 mg | SUBCUTANEOUS | Status: DC

## 2017-06-10 MED ORDER — DEXTROSE-NACL 5-0.2 % IV SOLN
INTRAVENOUS | Status: DC
  Administered 2017-06-11: 03:00:00 via INTRAVENOUS

## 2017-06-10 MED ORDER — ACETAMINOPHEN 650 MG RE SUPP: 650.0000 mg | Freq: Four times a day (QID) | RECTAL | Status: DC | PRN

## 2017-06-10 MED ORDER — VANCOMYCIN HCL IN DEXTROSE 1-5 GM/200ML-% IV SOLN
1000.0000 mg | INTRAVENOUS | Status: DC
Start: 2017-06-12 — End: 2017-06-11

## 2017-06-10 MED ORDER — PIPERACILLIN-TAZOBACTAM 3.375 G IVPB: 3.3750 g | Freq: Three times a day (TID) | INTRAVENOUS | Status: DC

## 2017-06-10 MED ORDER — ONDANSETRON HCL 4 MG/2ML IJ SOLN: 4.0000 mg | Freq: Three times a day (TID) | INTRAMUSCULAR | Status: DC | PRN

## 2017-06-10 MED ORDER — VANCOMYCIN HCL IN DEXTROSE 1-5 GM/200ML-% IV SOLN
1000.0000 mg | Freq: Once | INTRAVENOUS | Status: AC
  Administered 2017-06-10: 1000 mg via INTRAVENOUS
  Filled 2017-06-10: qty 200

## 2017-06-10 NOTE — ED Triage Notes (Signed)
Per EMS, pt from home with weakness that began yesterday worse today, pt had near syncopal episode and shob while trying to walk across the house. Pt recently dx with esrd and low hgb, last hgb was 7.9. Pt received 2 epogen injections. Pt slow to respond today and confused at times. Pt cold to touch. RA sat 90-92%, placed on 4L [place]. HR 50s sinus brady, BP 106/62, CBG 88. Pt following commands.

## 2017-06-10 NOTE — ED Notes (Signed)
Pharmacy notified on pt.'s Solucortef order.

## 2017-06-10 NOTE — H&P (Signed)
History and Physical    John Barr CLE:[id number] DOB: Sep 20, 1927 DOA: 06/10/2017  Referring MD/NP/PA:   PCP: [facility]   Patient coming from:  The patient is coming from home.  At baseline, pt is paritialy dependent for most of ADL.   Chief Complaint: generalized weakness and altered mental status.  HPI: John Barr is a 83 y.o. 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  Per patient's daughter, patient was recently diagnosed as ESRD and severe anemia with Hgb 7.9 last week. Pt was given Epogen injection twice. Last dose of Epogen injection was last week. Per pt's daughter, patient has increased weakness in the past several days after the Epogen injection, and became confused since yesterday. Patient moves all extremities, no facial droop or slurred speech. Patient has nausea and vomited once yesterday. He possibly had one episode of loose stool per her daughter. Patient does not have abdominal pain per her daughter, but he has abdominal distention on physical examination to me. Per his daughter, pt Patient seems to have shortness of breath, but  no cough, fever, chills or chest pain. Per EMS, pt had near syncopal episode and SOB while trying to walk across the house.  ED Course: pt was found to have hemoglobin 7.5-->6.7, WBC 6.8, BNP 361, lactic acid 1.01, INR 1.31, positive urinalysis with trace amount of leukocytes and many bacteria,  potassium 5.2, bicarbonate 17, creatinine 4.10, BUN 84, chloride 121, sodium 146, hypothermia with temperature 89.9, bradycardia with heart rate 45-55s, oxygen sat 92-95% on 4 L nasal cannula oxygen. Hypotension with Bp 87/57 which responded to IV fluid and improved to 115/70 after 2.5 L normal saline bolus. Chest x-ray showed bilateral  pleural effusion (Left side is worse than the right). Pt is admitted to stepdown bed as inpatient.   CT abd/pelvis: 1. Acute diverticulitis at the mid sigmoid colon, with soft tissue inflammation and focal wall thickening. No evidence of perforation or abscess formation at this time. Underlying mass cannot be excluded. Sigmoidoscopy could be considered after completion of treatment for diverticulitis, if deemed clinically appropriate. 2. Soft tissue inflammation about the bladder raises concern for cystitis. 3. Small to moderate bilateral pleural effusions, with partial consolidation of both lower lung lobes. 4. Cholelithiasis and likely sludge within the gallbladder. Mild soft tissue inflammation about the gallbladder may reflect cholecystitis, or may reflect motion artifact. Would correlate with the patient's LFTs. 5. Minimal underlying diverticulosis at the sigmoid colon. 6. Borderline enlarged prostate. 7. Scattered aortic atherosclerosis. 8. Diffuse coronary artery calcifications seen  CT -head showed 1.  No acute intracranial abnormality. 2. Stable CT appearance of sellar/ suprasellar mass. No evidence of significant progression from exam 7 months prior. 3. Stable atrophy and chronic small vessel ischemia.   Review of Systems: Could not be reviewed due to altered mental status.  Allergy: No Known Allergies  Past Medical History:  Diagnosis Date  . Arthritis   . Diabetes mellitus without complication ([place])   . Exposure to TB    hx  . GERD (gastroesophageal reflux disease)   . High frequency hearing loss   . Hypercholesterolemia   . Hypertension     Past Surgical History:  Procedure Laterality Date  . vocal cord repair  11/87    Social History:  reports that he quit smoking about 45 years ago. His smoking use included Cigarettes.  He has never used smokeless tobacco. He reports that he does not drink alcohol or use drugs.  Family History:  Family History  Problem  Relation Age of Onset  . Diabetes Mellitus II Mother   . Kidney disease Mother   . Heart disease Father   . Diabetes Mellitus II Father   . Breast cancer Sister      Prior to Admission medications   Medication Sig Start Date End Date Taking? Authorizing Provider  acetaminophen (TYLENOL) 500 MG tablet Take 1,000 mg by mouth every 6 (six) hours as needed for moderate pain or headache.   Yes [provider]  amLODipine (NORVASC) 10 MG tablet Take 1 tablet (10 mg total) by mouth daily. 11/27/16  Yes Rosita Fire, MD  insulin aspart (NOVOLOG) 100 UNIT/ML injection Inject 0-9 Units into the skin 3 (three) times daily with meals. Patient taking differently: Inject 0-9 Units into the skin 3 (three) times daily with meals. Per sliding scale 11/26/16  Yes Rosita Fire, MD  metoprolol tartrate (LOPRESSOR) 25 MG tablet Take 1 tablet (25 mg total) by mouth 2 (two) times daily. 11/26/16  Yes Rosita Fire, MD  omeprazole (PRILOSEC) 20 MG capsule TAKE 1 CAPSULE (20 MG TOTAL) BY MOUTH DAILY. 10/30/14  Yes Denita Lung, MD  ferrous sulfate 325 (65 FE) MG tablet Take 1 tablet (325 mg total) by mouth 2 (two) times daily with a meal. Patient not taking: Reported on 06/10/2017 11/26/16   Rosita Fire, MD  simvastatin (ZOCOR) 40 MG tablet Take 1 tablet (40 mg total) by mouth every evening. Patient not taking: Reported on 06/10/2017 08/29/14   Denita Lung, MD    Physical Exam: Vitals:   06/11/17 0547 06/11/17 0600 06/11/17 0615 06/11/17 0630  BP: (!) 126/96 110/65 116/65 110/65  Pulse: 86 71 69 72  Resp: 18 18 (!) 23 (!) 21  Temp: 98.5 F (36.9 C)     TempSrc: Oral     SpO2: 99% 97% 95% 95%  Weight:      Height:       General: Not in acute distress HEENT:       Eyes: PERRL, EOMI, no scleral icterus.       ENT: No discharge from the ears and nose, no pharynx injection, no tonsillar enlargement.        Neck: Difficult to assess JVD due to obesity, no bruit, no  mass felt. Heme: No neck lymph node enlargement. Cardiac: S1/S2, RRR, No murmurs, No gallops or rubs. Respiratory: No rales, wheezing, rhonchi or rubs. GI: Distended, nontender, no organomegaly, BS present. GU: No hematuria Ext: 2+ pitting leg edema bilaterally. 2+DP/PT pulse bilaterally. Musculoskeletal: No joint deformities, No joint redness or warmth, no limitation of ROM in spin. Skin: No rashes.  Neuro: confused, know her daughter's name, not oriented to time and place. Cranial nerves II-XII grossly intact, moves all extremities. Psych: Patient is not psychotic, no suicidal or hemocidal ideation.  Labs on Admission: I have personally reviewed following labs and imaging studies  CBC:  Recent Labs Lab 06/10/17 2037 06/10/17 2108 06/11/17 0101  WBC 6.8  --  7.3  NEUTROABS 4.9  --   --   HGB 7.5* 7.5* 6.7*  HCT 23.6* 22.0* 21.5*  MCV 90.1  --  90.7  PLT 181  --  528   Basic Metabolic Panel:  Recent Labs Lab 06/10/17 2037 06/10/17 2108 06/11/17 0101  NA 146* 146* 145  K 5.2* 5.2* 5.2*  CL 121* 121* 122*  CO2 17*  --  14*  GLUCOSE 116* 109* 121*  BUN 84* 81* 77*  CREATININE 4.10* 4.30* 3.75*  CALCIUM 9.0  --  8.0*   GFR: Estimated Creatinine Clearance: 13.9 mL/min (A) (by C-G formula based on SCr of 3.75 mg/dL (H)). Liver Function Tests:  Recent Labs Lab 06/10/17 2037  AST 31  ALT 29  ALKPHOS 71  BILITOT 0.4  PROT 6.7  ALBUMIN 3.2*   No results for input(s): LIPASE, AMYLASE in the last 168 hours. No results for input(s): AMMONIA in the last 168 hours. Coagulation Profile:  Recent Labs Lab 06/10/17 2037  INR 1.36   Cardiac Enzymes: No results for input(s): CKTOTAL, CKMB, CKMBINDEX, TROPONINI in the last 168 hours. BNP (last 3 results) No results for input(s): PROBNP in the last 8760 hours. HbA1C: No results for input(s): HGBA1C in the last 72 hours. CBG:  Recent Labs Lab 06/11/17 0054  GLUCAP 115*   Lipid Profile: No results for input(s):  CHOL, HDL, LDLCALC, TRIG, CHOLHDL, LDLDIRECT in the last 72 hours. Thyroid Function Tests:  Recent Labs  06/10/17 2120  TSH 6.361*   Anemia Panel:  Recent Labs  06/11/17 0000  VITAMINB12 2,234*  FOLATE 22.6  FERRITIN 1,492*  TIBC 185*  IRON 52  RETICCTPCT 4.6*   Urine analysis:    Component Value Date/Time   COLORURINE AMBER (A) 06/10/2017 2131   APPEARANCEUR TURBID (A) 06/10/2017 2131   LABSPEC 1.014 06/10/2017 2131   PHURINE 5.0 06/10/2017 2131   GLUCOSEU NEGATIVE 06/10/2017 2131   HGBUR NEGATIVE 06/10/2017 2131   BILIRUBINUR NEGATIVE 06/10/2017 2131   BILIRUBINUR neg 05/17/2014 [place] 06/10/2017 2131   PROTEINUR 30 (A) 06/10/2017 2131   UROBILINOGEN negative 05/17/2014 1426   NITRITE NEGATIVE 06/10/2017 2131   LEUKOCYTESUR TRACE (A) 06/10/2017 2131   Sepsis Labs: @LABRCNTIP (procalcitonin:4,lacticidven:4) )No results found for this or any previous visit (from the past 240 hour(s)).   Radiological Exams on Admission: Ct Abdomen Pelvis Wo Contrast  Result Date: 06/11/2017 CLINICAL DATA:  Acute onset of generalized weakness. Nausea. Initial encounter. EXAM: CT ABDOMEN AND PELVIS WITHOUT CONTRAST TECHNIQUE: Multidetector CT imaging of the abdomen and pelvis was performed following the standard protocol without IV contrast. COMPARISON:  Renal ultrasound from 11/22/2016 FINDINGS: Lower chest: Small to moderate bilateral pleural effusions are noted, with partial consolidation of both lower lung lobes. Diffuse coronary artery calcifications are seen. Hepatobiliary: The liver is unremarkable. Stones and likely sludge are noted within the gallbladder. Mild soft tissue inflammation about the gallbladder may reflect cholecystitis. The common bile duct remains normal in caliber. Pancreas: The pancreas is within normal limits. Spleen: The spleen is unremarkable in appearance. Adrenals/Urinary Tract: The adrenal glands are unremarkable in appearance. Diffuse  perinephric stranding is noted bilaterally. There is no evidence of hydronephrosis. No renal or ureteral stones are identified. Stomach/Bowel: Focal wall thickening is noted at the mid sigmoid colon, with associated soft tissue inflammation and inflamed diverticula, concerning for acute diverticulitis. Minimal underlying diverticulosis is noted. There is no evidence of perforation or abscess formation at this time. Vascular/Lymphatic: Scattered calcification is seen along the abdominal aorta and its branches. The abdominal aorta is otherwise grossly unremarkable. The inferior vena cava is grossly unremarkable. No retroperitoneal lymphadenopathy is seen. No pelvic sidewall lymphadenopathy is identified. Reproductive: Soft tissue inflammation about the bladder raises concern for cystitis. The prostate is borderline enlarged. Other: No additional soft tissue abnormalities are seen. Musculoskeletal: No acute osseous abnormalities are  identified. Intervertebral disc space narrowing is noted at L4-L5. Multilevel vacuum phenomenon is noted along the lumbar spine. The visualized musculature is unremarkable in appearance. IMPRESSION: 1. Acute diverticulitis at the mid sigmoid colon, with soft tissue inflammation and focal wall thickening. No evidence of perforation or abscess formation at this time. Underlying mass cannot be excluded. Sigmoidoscopy could be considered after completion of treatment for diverticulitis, if deemed clinically appropriate. 2. Soft tissue inflammation about the bladder raises concern for cystitis. 3. Small to moderate bilateral pleural effusions, with partial consolidation of both lower lung lobes. 4. Cholelithiasis and likely sludge within the gallbladder. Mild soft tissue inflammation about the gallbladder may reflect cholecystitis, or may reflect motion artifact. Would correlate with the patient's LFTs. 5. Minimal underlying diverticulosis at the sigmoid colon. 6. Borderline enlarged prostate.  7. Scattered aortic atherosclerosis. 8. Diffuse coronary artery calcifications seen. Electronically Signed   By: Garald Balding M.D.   On: 06/11/2017 00:46   Ct Head Wo Contrast  Result Date: 06/11/2017 CLINICAL DATA:  Altered mental status.  Weakness. EXAM: CT HEAD WITHOUT CONTRAST TECHNIQUE: Contiguous axial images were obtained from the base of the skull through the vertex without intravenous contrast. COMPARISON:  Head CT 11/23/2016 FINDINGS: Brain: Mass lesion in the sella/ suprasellar cistern is similar appearance to prior CT. No evidence of progression. No intracranial hemorrhage. No acute ischemia. Stable atrophy and chronic white matter disease. Small evidence days in both basal ganglia suggest prior lacunar infarcts versus prominent perivascular spaces. No hydrocephalus, the basilar cisterns are patent. Vascular: Atherosclerosis of skullbase vasculature without hyperdense vessel or abnormal calcification. Skull: No focal lesion or skull fracture. Sinuses/Orbits: Paranasal sinuses and mastoid air cells are clear. The visualized orbits are unremarkable. Probable bilateral cataract section. Other: None. IMPRESSION: 1.  No acute intracranial abnormality. 2. Stable CT appearance of sellar/ suprasellar mass. No evidence of significant progression from exam 7 months prior. 3. Stable atrophy and chronic small vessel ischemia. Electronically Signed   By: Jeb Levering M.D.   On: 06/11/2017 00:44   Dg Chest Portable 1 View  Result Date: 06/10/2017 CLINICAL DATA:  Sepsis EXAM: PORTABLE CHEST 1 VIEW COMPARISON:  11/22/2016 FINDINGS: Cardiac shadow is enlarged. Bilateral pleural effusions are noted left greater than right. There is likely underlying basilar atelectasis present as well. No bony abnormality is seen. IMPRESSION: Bilateral pleural effusions with associated atelectatic changes. Electronically Signed   By: Inez Catalina M.D.   On: 06/10/2017 21:17     EKG: Independently reviewed.  Sinus rhythm,  low voltage, nonspecific T-wave change.  Assessment/Plan Principal Problem:   Acute metabolic encephalopathy Active Problems:   CKD (chronic kidney disease), stage V ([facility]CC)   Brain mass   GERD (gastroesophageal reflux disease)   Hypercholesterolemia   Hypertension   Type II diabetes mellitus with renal manifestations ([facility])   Hypernatremia   Hyperkalemia   Hypotension   Sepsis ([facility])   Hypothermia   Normocytic anemia   Abdominal distension   Diverticulitis large intestine   Acute metabolic encephalopathy: Likely due to multifactorial etiologies, including acute diverticulitis, sepsis, electrolytes disturbance, worsening renal function. CT head is negative for acute intracranial abnormalities. -Admitted to stepdown as inpatient. -treat underlying issues as below, - frequent neurologic checks  Abdominal distention: CT scan showed acute diverticulitis at the mid sigmoid colon, with soft tissue inflammation and focal wall thickening. No evidence of perforation or abscess formation at this time. Underlying mass cannot be excluded.  -IV Cipro and Flagyl (patient received one dose of vancomycin  and Zosyn for sepsis before getting back the CT scan results). -prn zofran for nausea and vomiting and morphine pain -IVF -Sigmoidoscopy could be considered after completion of treatment for diverticulitis since underlying mass cannot be excluded.   Possible UTI: pt has positive urinalysis with trace amount of leukocytes and many bacteria -On IV antibiotics as above -Follow-up urine culture.  Sepsis and hypotension and hypothermia: Patient is septic with hypotension and hypothermia. Blood pressure responded to IV fluid. Currently her blood pressure is 115/70 after 2.5 L normal sitting bolus. -IV abx as above -f/u Bx and Ux -given one dose of Solu Cortef 100 mg 1 -f/u cortisol level -will get Procalcitonin and trend lactic acid levels per sepsis protocol. -IVF: 2.5 L of NS bolus in ED,  followed by 75 cc of D5-1/4 NS cc/h due to hypernatremia - apply warm blanket  AoCKD-V: Creatinine 4.01, BUN 84, potassium 5.2, bicarbonate 17, chloride 121. Pt is not on dialysis yet. -continue IVF: 2.5 L NS and then D5-1/4 NS due to hypernatremia. -f/u renal Fx by BMP  Hyperkalemia: K=5.2. -pt was given one dose of Kayexalate and 45 g (this is done before knowing that patient had diverticulitis)  Hypernatremia: due to dehydration. -IV fluid as above including D5-1/4 NS at 75 c/h -f/u by BMP  DM-II with renal complications: Last W4R 6.6 on 11/17/16, well controled. Patient is taking NovoLog at home -SSI  HTN:  -hold amlodipine and metoprolol due to hypotension  HLD: -hold Zocor until mental status improves  Brain mass:  Pt has a known suprasellar and sellar mass. Was seen by neurosurgery in [facility]. Patient is not a surgical candidate per neurosurgeon. -Follow up with neurosurgeon  Normocytic anemia: Hemoglobin dropped from 8.6 on 11/25/16-7.5, then further to 6.7. No GI bleeding noted. -Anemia panel -check FOBT -Transfuse 1 unit of blood -f/u CBC q6h  GERD: -Pepcid IV    DVT ppx: SCD Code Status: Full code (I discussed with her daughter , who is his POA about CODE STATUS, wanted to be full code) Family Communication: None at bed side.              Yes, patient's daughter at bed side Disposition Plan:  Anticipate discharge back to previous home environment Consults called:  none Admission status:  SDU/inpation       Date of Service 06/11/2017    Ivor Costa [facility] Pager [phone]  If 7PM-7AM, please contact night-coverage [email or web address] Password [facility] 06/11/2017, 6:48 AM

## 2017-06-10 NOTE — Progress Notes (Signed)
Pharmacy Antibiotic Note  Jamareon Shimel is a 83 y.o. male admitted on 06/10/2017 with sepsis.  Pharmacy has been consulted for vancomycin and zosyn dosing. AKI on CKD with Cr 4.3, CrCl 12 ml/min. Baseline Cr appears to be around 3.   Vancomycin trough goal 15-20  Plan: 1) Vancomycin 1g IV q48 2) Zosyn 3.375g IV q8 (EI) 3) Follow renal function, cultures, LOT, level if needed  Height: 5\' 7"  (170.2 cm) Weight: 180 lb (81.6 kg) IBW/kg (Calculated) : 66.1  Temp (24hrs), Avg:89.9 F (32.2 C), Min:89.9 F (32.2 C), Max:89.9 F (32.2 C)   Recent Labs Lab 06/10/17 2049 06/10/17 2108  CREATININE  --  4.30*  LATICACIDVEN 1.31  --     Estimated Creatinine Clearance: 12.1 mL/min (A) (by C-G formula based on SCr of 4.3 mg/dL (H)).    No Known Allergies  Antimicrobials this admission: 8/15 Vancomycin >> 8/15 Zosyn >>  Dose adjustments this admission: n/a  Microbiology results: 8/15 blood cx >>  Thank you for allowing pharmacy to be a part of this patient's care.  Deboraha Sprang 06/10/2017 9:33 PM

## 2017-06-10 NOTE — ED Notes (Signed)
Elevated TSH reported Dr. Blaine Hamper.

## 2017-06-10 NOTE — ED Provider Notes (Signed)
[facility] DEPT Provider Note   CSN: [id number] Arrival date & time: 06/10/17  2010     History   Chief Complaint Chief Complaint  Patient presents with  . Weakness  . Altered Mental Status    HPI John Barr is a 83 y.o. male.  83 year old male presents with weakness that began yesterday with associated near a today. Recently diagnosed with anemia and hasn't been taking medication for that. The source of the anemia is felt to be due to his renal disease. Denies any GI bleeding currently. Went to the [place] clinic today and receive treatment and when he got home he felt okay. Patient then was given a go to bed became short of breath and Dr. Nicki Reaper loses balance. Did not strike his head. EMS was called and patient was cooled to the touch with a low pulse oximetry. Was in sinus bradycardia at a rate of 50. CBG was 88 and patient was transported here.      Past Medical History:  Diagnosis Date  . Arthritis   . Diabetes mellitus without complication ([place])   . Exposure to TB    hx  . GERD (gastroesophageal reflux disease)   . High frequency hearing loss   . Hypercholesterolemia   . Hypertension     Patient Active Problem List   Diagnosis Date Noted  . Brain mass   . Acute kidney injury superimposed on CKD ([place]) 11/22/2016  . Frequent falls 11/22/2016  . Hyperlipidemia associated with type 2 diabetes mellitus ([place]) 09/27/2014  . Diabetes ([place]) 07/04/2013  . Hypertension associated with diabetes ([place]) 07/04/2013  . Arthritis 07/04/2013    Past Surgical History:  Procedure Laterality Date  . vocal cord repair  11/87       Home Medications    Prior to Admission medications   Medication Sig Start Date End Date Taking? Authorizing Provider  acetaminophen (TYLENOL) 500 MG tablet Take 1,000 mg by mouth every 6 (six) hours as needed for moderate pain or headache.    [provider]  amLODipine (NORVASC) 10 MG tablet Take 1 tablet (10 mg total) by mouth daily.  11/27/16   Rosita Fire, MD  ferrous sulfate 325 (65 FE) MG tablet Take 1 tablet (325 mg total) by mouth 2 (two) times daily with a meal. 11/26/16   Rosita Fire, MD  insulin aspart (NOVOLOG) 100 UNIT/ML injection Inject 0-9 Units into the skin 3 (three) times daily with meals. 11/26/16   Rosita Fire, MD  metoprolol tartrate (LOPRESSOR) 25 MG tablet Take 1 tablet (25 mg total) by mouth 2 (two) times daily. 11/26/16   Rosita Fire, MD  omeprazole (PRILOSEC) 20 MG capsule TAKE 1 CAPSULE (20 MG TOTAL) BY MOUTH DAILY. Patient not taking: Reported on 11/22/2016 10/30/14   Denita Lung, MD  simvastatin (ZOCOR) 40 MG tablet Take 1 tablet (40 mg total) by mouth every evening. Patient not taking: Reported on 11/22/2016 08/29/14   Denita Lung, MD    Family History History reviewed. No pertinent family history.  Social History Social History  Substance Use Topics  . Smoking status: Former Smoker    Types: Cigarettes    Quit date: 10/31/1971  . Smokeless tobacco: Never Used  . Alcohol use No     Allergies   Patient has no known allergies.   Review of Systems Review of Systems  All other systems reviewed and are negative.    Physical Exam Updated Vital Signs BP (!) 94/56 (BP Location:  Right Arm)   Pulse (!) 57   Resp 15   Ht 1.702 m (5\' 7" )   Wt 81.6 kg (180 lb)   SpO2 95%   BMI 28.19 kg/m   Physical Exam  Constitutional: He is oriented to person, place, and time. He appears well-developed and well-nourished. He appears lethargic.  Non-toxic appearance. No distress.  HENT:  Head: Normocephalic and atraumatic.  Eyes: Pupils are equal, round, and reactive to light. Conjunctivae, EOM and lids are normal.  Neck: Normal range of motion. Neck supple. No tracheal deviation present. No thyroid mass present.  Cardiovascular: Normal rate, regular rhythm and normal heart sounds.  Exam reveals no gallop.   No murmur heard. Pulmonary/Chest: Effort normal and  breath sounds normal. No stridor. No respiratory distress. He has no decreased breath sounds. He has no wheezes. He has no rhonchi. He has no rales.  Abdominal: Soft. Normal appearance and bowel sounds are normal. He exhibits no distension. There is no tenderness. There is no rebound and no CVA tenderness.  Musculoskeletal: Normal range of motion. He exhibits no edema or tenderness.  Neurological: He is oriented to person, place, and time. He has normal strength. He appears lethargic. No cranial nerve deficit or sensory deficit. GCS eye subscore is 4. GCS verbal subscore is 5. GCS motor subscore is 6.  Skin: Skin is warm and dry. No abrasion and no rash noted.  Psychiatric: His speech is normal. His affect is blunt. He is slowed.  Nursing note and vitals reviewed.    ED Treatments / Results  Labs (all labs ordered are listed, but only abnormal results are displayed) Labs Reviewed  I-STAT CHEM 8, ED - Abnormal; Notable for the following:       Result Value   Sodium 146 (*)    Potassium 5.2 (*)    Chloride 121 (*)    BUN 81 (*)    Creatinine, Ser 4.30 (*)    Glucose, Bld 109 (*)    Hemoglobin 7.5 (*)    HCT 22.0 (*)    All other components within normal limits  CULTURE, BLOOD (ROUTINE X 2)  CULTURE, BLOOD (ROUTINE X 2)  COMPREHENSIVE METABOLIC PANEL  CBC WITH DIFFERENTIAL/PLATELET  PROTIME-INR  URINALYSIS, ROUTINE W REFLEX MICROSCOPIC  TSH  I-STAT CG4 LACTIC ACID, ED  TYPE AND SCREEN    EKG  EKG Interpretation None     Patient here after becoming weak and almost passing out. Butch Penny be hypothermic urine blood sugar was above 80. Patient finally profound hypothermic and placed on external warming device. Concern for sepsis as patient was hypotensive. Given fluid bolus as well as started on empiric antibiotics. Chest x-ray without acute findings. We'll admit to the hospitalist.  CRITICAL CARE Performed by: Leota Jacobsen Total critical care time: 60 minutes Critical care time  was exclusive of separately billable procedures and treating other patients. Critical care was necessary to treat or prevent imminent or life-threatening deterioration. Critical care was time spent personally by me on the following activities: development of treatment plan with patient and/or surrogate as well as nursing, discussions with consultants, evaluation of patient's response to treatment, examination of patient, obtaining history from patient or surrogate, ordering and performing treatments and interventions, ordering and review of laboratory studies, ordering and review of radiographic studies, pulse oximetry and re-evaluation of patient's condition.   Radiology No results found.  Procedures Procedures (including critical care time)  Medications Ordered in ED Medications - No data to display   Initial Impression /  Assessment and Plan / ED Course  I have reviewed the triage vital signs and the nursing notes.  Pertinent labs & imaging results that were available during my care of the patient were reviewed by me and considered in my medical decision making (see chart for details).      CRITICAL CARE Performed by: Leota Jacobsen Total critical care time: 60 minutes Critical care time was exclusive of separately billable procedures and treating other patients. Critical care was necessary to treat or prevent imminent or life-threatening deterioration. Critical care was time spent personally by me on the following activities: development of treatment plan with patient and/or surrogate as well as nursing, discussions with consultants, evaluation of patient's response to treatment, examination of patient, obtaining history from patient or surrogate, ordering and performing treatments and interventions, ordering and review of laboratory studies, ordering and review of radiographic studies, pulse oximetry and re-evaluation of patient's condition.   Final Clinical Impressions(s) / ED  Diagnoses   Final diagnoses:  None    New Prescriptions New Prescriptions   No medications on file     Lacretia Leigh, MD 06/10/17 2315

## 2017-06-10 NOTE — ED Notes (Signed)
Dr Zenia Resides notified of fall in pt's blood pressure and to order another bolus of fluids.

## 2017-06-11 ENCOUNTER — Inpatient Hospital Stay (HOSPITAL_COMMUNITY): Payer: Medicare Other

## 2017-06-11 ENCOUNTER — Encounter (HOSPITAL_COMMUNITY): Payer: Self-pay | Admitting: Internal Medicine

## 2017-06-11 DIAGNOSIS — K5732 Diverticulitis of large intestine without perforation or abscess without bleeding: Secondary | ICD-10-CM | POA: Diagnosis present

## 2017-06-11 LAB — ABO/RH: ABO/RH(D): AB POS

## 2017-06-11 LAB — CBC
HEMATOCRIT: 21.5 % — AB (ref 39.0–52.0)
HEMATOCRIT: 24.9 % — AB (ref 39.0–52.0)
HEMATOCRIT: 24.2 % — AB (ref 39.0–52.0)
HEMATOCRIT: 24.3 % — AB (ref 39.0–52.0)
HEMOGLOBIN: 8 g/dL — AB (ref 13.0–17.0)
HEMOGLOBIN: 7.7 g/dL — AB (ref 13.0–17.0)
Hemoglobin: 6.7 g/dL — CL (ref 13.0–17.0)
Hemoglobin: 7.7 g/dL — ABNORMAL LOW (ref 13.0–17.0)
MCH: 28.3 pg (ref 26.0–34.0)
MCH: 28.8 pg (ref 26.0–34.0)
MCH: 28.5 pg (ref 26.0–34.0)
MCH: 28.7 pg (ref 26.0–34.0)
MCHC: 31.2 g/dL (ref 30.0–36.0)
MCHC: 32.1 g/dL (ref 30.0–36.0)
MCHC: 31.8 g/dL (ref 30.0–36.0)
MCHC: 31.7 g/dL (ref 30.0–36.0)
MCV: 90.7 fL (ref 78.0–100.0)
MCV: 89.6 fL (ref 78.0–100.0)
MCV: 89.6 fL (ref 78.0–100.0)
MCV: 90.7 fL (ref 78.0–100.0)
PLATELETS: 177 10*3/uL (ref 150–400)
Platelets: 167 10*3/uL (ref 150–400)
Platelets: 159 10*3/uL (ref 150–400)
Platelets: 157 10*3/uL (ref 150–400)
RBC: 2.37 MIL/uL — ABNORMAL LOW (ref 4.22–5.81)
RBC: 2.78 MIL/uL — AB (ref 4.22–5.81)
RBC: 2.7 MIL/uL — AB (ref 4.22–5.81)
RBC: 2.68 MIL/uL — ABNORMAL LOW (ref 4.22–5.81)
RDW: 19.1 % — AB (ref 11.5–15.5)
RDW: 19.2 % — ABNORMAL HIGH (ref 11.5–15.5)
RDW: 19.3 % — ABNORMAL HIGH (ref 11.5–15.5)
RDW: 19.9 % — AB (ref 11.5–15.5)
WBC: 7.3 10*3/uL (ref 4.0–10.5)
WBC: 6.3 10*3/uL (ref 4.0–10.5)
WBC: 7.5 10*3/uL (ref 4.0–10.5)
WBC: 8.9 10*3/uL (ref 4.0–10.5)

## 2017-06-11 LAB — POCT I-STAT 3, ART BLOOD GAS (G3+)
Acid-base deficit: 13 mmol/L — ABNORMAL HIGH (ref 0.0–2.0)
Bicarbonate: 14 mmol/L — ABNORMAL LOW (ref 20.0–28.0)
O2 SAT: 91 %
PCO2 ART: 34 mmHg (ref 32.0–48.0)
PO2 ART: 69 mmHg — AB (ref 83.0–108.0)
Patient temperature: 97.6
TCO2: 15 mmol/L (ref 0–100)
pH, Arterial: 7.221 — ABNORMAL LOW (ref 7.350–7.450)

## 2017-06-11 LAB — IRON AND TIBC
IRON: 52 ug/dL (ref 45–182)
Saturation Ratios: 28 % (ref 17.9–39.5)
TIBC: 185 ug/dL — ABNORMAL LOW (ref 250–450)
UIBC: 133 ug/dL

## 2017-06-11 LAB — BASIC METABOLIC PANEL
ANION GAP: 11 (ref 5–15)
Anion gap: 9 (ref 5–15)
Anion gap: 10 (ref 5–15)
BUN: 77 mg/dL — AB (ref 6–20)
BUN: 79 mg/dL — AB (ref 6–20)
BUN: 81 mg/dL — AB (ref 6–20)
CALCIUM: 8 mg/dL — AB (ref 8.9–10.3)
CHLORIDE: 119 mmol/L — AB (ref 101–111)
CO2: 14 mmol/L — ABNORMAL LOW (ref 22–32)
CO2: 14 mmol/L — AB (ref 22–32)
CO2: 14 mmol/L — ABNORMAL LOW (ref 22–32)
CREATININE: 3.75 mg/dL — AB (ref 0.61–1.24)
Calcium: 8 mg/dL — ABNORMAL LOW (ref 8.9–10.3)
Calcium: 8.1 mg/dL — ABNORMAL LOW (ref 8.9–10.3)
Chloride: 122 mmol/L — ABNORMAL HIGH (ref 101–111)
Chloride: 121 mmol/L — ABNORMAL HIGH (ref 101–111)
Creatinine, Ser: 3.95 mg/dL — ABNORMAL HIGH (ref 0.61–1.24)
Creatinine, Ser: 4.1 mg/dL — ABNORMAL HIGH (ref 0.61–1.24)
GFR calc Af Amer: 15 mL/min — ABNORMAL LOW (ref >60)
GFR calc Af Amer: 14 mL/min — ABNORMAL LOW (ref >60)
GFR calc non Af Amer: 12 mL/min — ABNORMAL LOW (ref >60)
GFR, EST AFRICAN AMERICAN: 14 mL/min — AB (ref >60)
GFR, EST NON AFRICAN AMERICAN: 13 mL/min — AB (ref >60)
GFR, EST NON AFRICAN AMERICAN: 12 mL/min — AB (ref >60)
GLUCOSE: 121 mg/dL — AB (ref 65–99)
GLUCOSE: 138 mg/dL — AB (ref 65–99)
Glucose, Bld: 142 mg/dL — ABNORMAL HIGH (ref 65–99)
POTASSIUM: 5.2 mmol/L — AB (ref 3.5–5.1)
POTASSIUM: 6 mmol/L — AB (ref 3.5–5.1)
POTASSIUM: 5.1 mmol/L (ref 3.5–5.1)
SODIUM: 145 mmol/L (ref 135–145)
SODIUM: 146 mmol/L — AB (ref 135–145)
Sodium: 143 mmol/L (ref 135–145)

## 2017-06-11 LAB — PROCALCITONIN: PROCALCITONIN: 0.19 ng/mL

## 2017-06-11 LAB — FOLATE: FOLATE: 22.6 ng/mL (ref >5.9)

## 2017-06-11 LAB — CBG MONITORING, ED
GLUCOSE-CAPILLARY: 115 mg/dL — AB (ref 65–99)
GLUCOSE-CAPILLARY: 120 mg/dL — AB (ref 65–99)
GLUCOSE-CAPILLARY: 129 mg/dL — AB (ref 65–99)
Glucose-Capillary: 145 mg/dL — ABNORMAL HIGH (ref 65–99)

## 2017-06-11 LAB — PREPARE RBC (CROSSMATCH)

## 2017-06-11 LAB — CORTISOL-AM, BLOOD

## 2017-06-11 LAB — NA AND K (SODIUM & POTASSIUM), RAND UR
Potassium Urine: 35 mmol/L
Sodium, Ur: 24 mmol/L

## 2017-06-11 LAB — GLUCOSE, CAPILLARY
GLUCOSE-CAPILLARY: 148 mg/dL — AB (ref 65–99)
GLUCOSE-CAPILLARY: 129 mg/dL — AB (ref 65–99)

## 2017-06-11 LAB — VITAMIN B12: VITAMIN B 12: 2234 pg/mL — AB (ref 180–914)

## 2017-06-11 LAB — RETICULOCYTES
RBC.: 2.38 MIL/uL — ABNORMAL LOW (ref 4.22–5.81)
RETIC COUNT ABSOLUTE: 109.5 10*3/uL (ref 19.0–186.0)
Retic Ct Pct: 4.6 % — ABNORMAL HIGH (ref 0.4–3.1)

## 2017-06-11 LAB — GLUCOSE, RANDOM: Glucose, Bld: 160 mg/dL — ABNORMAL HIGH (ref 65–99)

## 2017-06-11 LAB — AMMONIA: Ammonia: 33 umol/L (ref 9–35)

## 2017-06-11 LAB — FERRITIN: Ferritin: 1492 ng/mL — ABNORMAL HIGH (ref 24–336)

## 2017-06-11 LAB — LACTIC ACID, PLASMA: Lactic Acid, Venous: 0.9 mmol/L (ref 0.5–1.9)

## 2017-06-11 LAB — APTT: APTT: 45 s — AB (ref 24–36)

## 2017-06-11 MED ORDER — SODIUM CHLORIDE 0.9 % IV BOLUS (SEPSIS): 500.0000 mL | Freq: Once | INTRAVENOUS | Status: DC

## 2017-06-11 MED ORDER — ALBUTEROL SULFATE (2.5 MG/3ML) 0.083% IN NEBU
10.0000 mg | INHALATION_SOLUTION | Freq: Once | RESPIRATORY_TRACT | Status: AC
  Administered 2017-06-11: 10 mg via RESPIRATORY_TRACT
  Filled 2017-06-11: qty 12

## 2017-06-11 MED ORDER — CIPROFLOXACIN IN D5W 400 MG/200ML IV SOLN
400.0000 mg | Freq: Two times a day (BID) | INTRAVENOUS | Status: DC
  Administered 2017-06-11: 400 mg via INTRAVENOUS
  Filled 2017-06-11 (×2): qty 200

## 2017-06-11 MED ORDER — INSULIN ASPART 100 UNIT/ML IV SOLN: 10.0000 [IU] | Freq: Once | INTRAVENOUS | Status: DC

## 2017-06-11 MED ORDER — INSULIN ASPART 100 UNIT/ML ~~LOC~~ SOLN: 10.0000 [IU] | Freq: Once | SUBCUTANEOUS | Status: DC

## 2017-06-11 MED ORDER — METRONIDAZOLE IN NACL 5-0.79 MG/ML-% IV SOLN
500.0000 mg | Freq: Three times a day (TID) | INTRAVENOUS | Status: DC
  Administered 2017-06-11 – 2017-06-15 (×14): 500 mg via INTRAVENOUS
  Filled 2017-06-11 (×14): qty 100

## 2017-06-11 MED ORDER — MORPHINE SULFATE (PF) 4 MG/ML IV SOLN: 2.0000 mg | INTRAVENOUS | Status: DC | PRN

## 2017-06-11 MED ORDER — SODIUM CHLORIDE 0.9 % IV SOLN: Freq: Once | INTRAVENOUS | Status: DC

## 2017-06-11 MED ORDER — CIPROFLOXACIN IN D5W 400 MG/200ML IV SOLN
400.0000 mg | INTRAVENOUS | Status: DC
  Administered 2017-06-12 – 2017-06-15 (×4): 400 mg via INTRAVENOUS
  Filled 2017-06-11 (×5): qty 200

## 2017-06-11 MED ORDER — STERILE WATER FOR INJECTION IV SOLN
INTRAVENOUS | Status: AC
  Administered 2017-06-11 – 2017-06-12 (×2): via INTRAVENOUS
  Filled 2017-06-11 (×3): qty 850

## 2017-06-11 NOTE — Progress Notes (Signed)
Bladder scanned volume is 220 cc. Will continue to monitor.

## 2017-06-11 NOTE — Progress Notes (Signed)
PROGRESS NOTE    John Barr  YPP:[id number] DOB: 02-10-1927 DOA: 06/10/2017 PCP: [facility]   Brief Narrative:  John Barr is a 83 y.o. 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 Per patient's daughter, patient was recently diagnosed as ESRD and severe anemia with Hgb 7.9 last week. Pt was given Epogen injection twice. Last dose of Epogen injection was last week. Per pt's daughter, patient has increased weakness in the past several days after the Epogen injection, and became confused since yesterday. Patient moves all extremities, no facial droop or slurred speech. Patient had nausea and vomited once yesterday. He possibly had one episode of loose stool per her daughter. Patient does not have abdominal pain per her daughter, but he has abdominal distention. Admitted to SDU and Mental status slightly improved but then patient started hallucinating. Had Metabolic Acidosis so was changed to Bicarb gtt. Discussed with Nephrology and will need to have [place] with Family to assess if they would even consider Dialysis prior to Nephrology getting involved.   Assessment & Plan:   Principal Problem:   Acute metabolic encephalopathy Active Problems:   Acute renal failure superimposed on stage 5 chronic kidney disease, not on chronic dialysis (HCC)   Brain mass   GERD (gastroesophageal reflux disease)   Hypercholesterolemia   Hypertension   Type II diabetes mellitus with renal manifestations (HCC)   Hypernatremia   Hyperkalemia   Hypotension   Sepsis (HCC)   Hypothermia   Normocytic anemia   Abdominal distension   Diverticulitis large intestine  Acute metabolic encephalopathy:  -Likely due to multifactorial etiologies, including acute diverticulitis, sepsis, electrolytes  disturbance, worsening renal function.  -CT head is negative for acute intracranial abnormalities. -Admitted to stepdown as inpatient. - Treat underlying issues as below; ? If Encephalopathic from uremia - frequent neurologic checks - Get Swallow Screen  Abdominal distention:  -CT scan showed acute diverticulitis at the mid sigmoid colon, with soft tissue inflammation and focal wall thickening. No evidence of perforation or abscess formation at this time. Underlying mass cannot be excluded.  -IV Cipro and Flagyl (patient received one dose of vancomycin and Zosyn for sepsis before getting back the CT scan results). -prn zofran for nausea and vomiting and morphine pain -IVF changed to Bicarb gtt -Sigmoidoscopy could be considered after completion of treatment for diverticulitis since underlying mass cannot be excluded.   Possible UTI:  -pt has positive urinalysis with trace amount of leukocytes and many bacteria -On IV antibiotics as above -Follow-up urine culture.  Sepsis and hypotension and hypothermia:  -Patient is septic with hypotension and hypothermia. Blood pressure responded to IV fluid.  -Given 2.5 L normal sitting bolus. -IV abx as above -f/u Bx and Ux -given one dose of Solu Cortef 100 mg 1 -f/u cortisol level -will get Procalcitonin and trend lactic acid levels per sepsis protocol. -IVF: 2.5 L of NS bolus in ED, followed by 75 cc of D5-1/4 NS cc/h due to hypernatremia; Changed IVF to Sodium Bicarbonate 150 mEQ - apply warm blanket  AoCKD-V:  -On AdmissionCreatinine 4.01, BUN 84, potassium 5.2, bicarbonate 17, chloride 121. Pt is not on dialysis yet. -continue IVF: 2.5 L NS and then D5-1/4 NS due to hypernatremia but changed to Sodium Bicarb As above -f/u renal Fx by BMP -Will need GOC discussion with family before Nephro sees for possible Dialysis -Place  Foley Catheter for Accurate I's/O's   Hyperkalemia: K=5.2 on and admission and worsened to 6.0 this AM -pt was  given one dose of Kayexalate and 45 g (this is done before knowing that patient had diverticulitis) -Given Albuterol neb and Bicarb drip and improved to 5.1 -Repeat CMP in AM   Hypernatremia:  -Due to dehydration. -IV fluid as above including D5-1/4 NS at 75 cc/hr changed to Sodium Bicarb gtt 150 mEQ -f/u by BMP  DM-II with renal complications:  -Last E2A 6.6 on 11/17/16, well controled. Patient is taking NovoLog at home -SSI  HTN:  -hold amlodipine and metoprolol due to hypotension  HLD: -hold Zocor until mental status improves  Metabolic Acidosis -Bicarb level was 14 -C/w Bicarb gtt  Brain mass:   -Pt has a known suprasellar and sellar mass. Was seen by neurosurgery in [facility]. Patient is not a surgical candidate per neurosurgeon. -Follow up with neurosurgeon  Normocytic anemia:  -Hemoglobin dropped from 8.6 on 11/25/16-7.5, then further to 6.7. No GI bleeding noted. -Anemia panel -check FOBT -Transfused 1 unit of blood and Hb/Hct improved to 7.7/2.43 -f/u CBC q6h  GERD: -Pepcid IV  DVT prophylaxis: SCDs Code Status: FULL CODE Family Communication: No family present at bedside Disposition Plan: Remain in SDU   Consultants:   Discussed Case with Nephrology Dr. Justin Mend   Procedures:   None   Antimicrobials:  Anti-infectives    Start     Dose/Rate Route Frequency Ordered Stop   06/12/17 2200  vancomycin (VANCOCIN) IVPB 1000 mg/200 mL premix  Status:  Discontinued     1,000 mg 200 mL/hr over 60 Minutes Intravenous Every 48 hours 06/10/17 2132 06/11/17 0254   06/12/17 0400  ciprofloxacin (CIPRO) IVPB 400 mg     400 mg 200 mL/hr over 60 Minutes Intravenous Every 24 hours 06/11/17 1607     06/11/17 0600  piperacillin-tazobactam (ZOSYN) IVPB 3.375 g  Status:  Discontinued     3.375 g 12.5 mL/hr over 240 Minutes Intravenous Every 8 hours 06/10/17 2132 06/11/17 0254   06/11/17 0400  metroNIDAZOLE (FLAGYL) IVPB 500 mg     500 mg 100 mL/hr over 60 Minutes  Intravenous Every 8 hours 06/11/17 0254     06/11/17 0400  ciprofloxacin (CIPRO) IVPB 400 mg  Status:  Discontinued     400 mg 200 mL/hr over 60 Minutes Intravenous Every 12 hours 06/11/17 0257 06/11/17 1607   06/10/17 2130  piperacillin-tazobactam (ZOSYN) IVPB 3.375 g     3.375 g 100 mL/hr over 30 Minutes Intravenous  Once 06/10/17 2123 06/10/17 2211   06/10/17 2130  vancomycin (VANCOCIN) IVPB 1000 mg/200 mL premix     1,000 mg 200 mL/hr over 60 Minutes Intravenous  Once 06/10/17 2123 06/10/17 2250     Subjective: Seen and examined in the ED and denied any pain. Per Nurse he was hallucinating. States he thought he was at home. No CP or SOB.   Objective: Vitals:   06/11/17 1500 06/11/17 1530 06/11/17 1606 06/11/17 1945  BP: 96/63 101/64  96/60  Pulse: 88 88 88 78  Resp: 20 19 18 19   Temp:   (!) 97.4 F (36.3 C) 98.6 F (37 C)  TempSrc:   Oral Axillary  SpO2: 98% 98% 98% 98%  Weight:      Height:        Intake/Output Summary (Last 24 hours) at 06/11/17 2015 Last data filed at 06/11/17 0545  Gross per 24 hour  Intake  8150 ml  Output                0 ml  Net             8150 ml   Filed Weights   06/10/17 2022  Weight: 81.6 kg (180 lb)   Examination: Physical Exam:  Constitutional: WN/WD obese AAM in NAD and appears calm and comfortable Eyes: Lids and conjunctivae normal, sclerae anicteric  ENMT: External Ears, Nose appear normal. Hard of hearing. Mucous membranes are dry.  Neck: Appears normal, supple, no cervical masses, normal ROM, no appreciable thyromegaly, no JVD Respiratory: Diminished to auscultation bilaterally, no wheezing, rales, rhonchi or crackles. Normal respiratory effort and patient is not tachypenic. No accessory muscle use.  Cardiovascular: RRR, no murmurs / rubs / gallops. S1 and S2 auscultated. 1+ LE edema.   Abdomen: Firm, non-tender, Distended due to body habitus. No masses palpated. No appreciable hepatosplenomegaly. Bowel sounds  positive.  GU: Deferred. Musculoskeletal: No clubbing / cyanosis of digits/nails. No joint deformity upper and lower extremities.  Skin: No rashes, lesions, ulcers on limited skin eval. No induration; Warm and dry.  Neurologic: CN 2-12 grossly intact with no focal deficits. Romberg sign cerebellar reflexes not assessed.  Psychiatric: Impaired judgment and insight. Awake but not alert and oriented x 3. Normal mood and appropriate affect.   Data Reviewed: I have personally reviewed following labs and imaging studies  CBC:  Recent Labs Lab 06/10/17 2037 06/10/17 2108 06/11/17 0101 06/11/17 0830 06/11/17 1232 06/11/17 1843  WBC 6.8  --  7.3 6.3 7.5 8.9  NEUTROABS 4.9  --   --   --   --   --   HGB 7.5* 7.5* 6.7* 8.0* 7.7* 7.7*  HCT 23.6* 22.0* 21.5* 24.9* 24.2* 24.3*  MCV 90.1  --  90.7 89.6 89.6 90.7  PLT 181  --  177 167 159 016   Basic Metabolic Panel:  Recent Labs Lab 06/10/17 2037 06/10/17 2108 06/11/17 0101 06/11/17 0830 06/11/17 1232 06/11/17 1602  NA 146* 146* 145 143 146*  --   K 5.2* 5.2* 5.2* 6.0* 5.1  --   CL 121* 121* 122* 119* 121*  --   CO2 17*  --  14* 14* 14*  --   GLUCOSE 116* 109* 121* 138* 142* 160*  BUN 84* 81* 77* 79* 81*  --   CREATININE 4.10* 4.30* 3.75* 3.95* 4.10*  --   CALCIUM 9.0  --  8.0* 8.0* 8.1*  --    GFR: Estimated Creatinine Clearance: 12.7 mL/min (A) (by C-G formula based on SCr of 4.1 mg/dL (H)). Liver Function Tests:  Recent Labs Lab 06/10/17 2037  AST 31  ALT 29  ALKPHOS 71  BILITOT 0.4  PROT 6.7  ALBUMIN 3.2*   No results for input(s): LIPASE, AMYLASE in the last 168 hours.  Recent Labs Lab 06/11/17 0830  AMMONIA 33   Coagulation Profile:  Recent Labs Lab 06/10/17 2037  INR 1.36   Cardiac Enzymes: No results for input(s): CKTOTAL, CKMB, CKMBINDEX, TROPONINI in the last 168 hours. BNP (last 3 results) No results for input(s): PROBNP in the last 8760 hours. HbA1C: No results for input(s): HGBA1C in the last  72 hours. CBG:  Recent Labs Lab 06/11/17 0054 06/11/17 0734 06/11/17 1036 06/11/17 1235 06/11/17 1628  GLUCAP 115* 145* 120* 129* 148*   Lipid Profile: No results for input(s): CHOL, HDL, LDLCALC, TRIG, CHOLHDL, LDLDIRECT in the last 72 hours. Thyroid Function Tests:  Recent Labs  06/10/17 2120  TSH 6.361*   Anemia Panel:  Recent Labs  06/11/17 0000  VITAMINB12 2,234*  FOLATE 22.6  FERRITIN 1,492*  TIBC 185*  IRON 52  RETICCTPCT 4.6*   Sepsis Labs:  Recent Labs Lab 06/10/17 2049 06/10/17 2341 06/11/17 0830  PROCALCITON  --   --  0.19  LATICACIDVEN 1.31 0.80 0.9    Recent Results (from the past 240 hour(s))  Culture, blood (Routine x 2)     Status: None (Preliminary result)   Collection Time: 06/10/17  8:37 PM  Result Value Ref Range Status   Specimen Description BLOOD LEFT FOREARM  Final   Special Requests   Final    BOTTLES DRAWN AEROBIC AND ANAEROBIC Blood Culture adequate volume   Culture NO GROWTH < 12 HOURS  Final   Report Status PENDING  Incomplete  Culture, blood (Routine x 2)     Status: None (Preliminary result)   Collection Time: 06/10/17  9:00 PM  Result Value Ref Range Status   Specimen Description BLOOD LEFT ANTECUBITAL  Final   Special Requests   Final    BOTTLES DRAWN AEROBIC AND ANAEROBIC Blood Culture adequate volume   Culture NO GROWTH < 12 HOURS  Final   Report Status PENDING  Incomplete    Radiology Studies: Ct Abdomen Pelvis Wo Contrast  Result Date: 06/11/2017 CLINICAL DATA:  Acute onset of generalized weakness. Nausea. Initial encounter. EXAM: CT ABDOMEN AND PELVIS WITHOUT CONTRAST TECHNIQUE: Multidetector CT imaging of the abdomen and pelvis was performed following the standard protocol without IV contrast. COMPARISON:  Renal ultrasound from 11/22/2016 FINDINGS: Lower chest: Small to moderate bilateral pleural effusions are noted, with partial consolidation of both lower lung lobes. Diffuse coronary artery calcifications are  seen. Hepatobiliary: The liver is unremarkable. Stones and likely sludge are noted within the gallbladder. Mild soft tissue inflammation about the gallbladder may reflect cholecystitis. The common bile duct remains normal in caliber. Pancreas: The pancreas is within normal limits. Spleen: The spleen is unremarkable in appearance. Adrenals/Urinary Tract: The adrenal glands are unremarkable in appearance. Diffuse perinephric stranding is noted bilaterally. There is no evidence of hydronephrosis. No renal or ureteral stones are identified. Stomach/Bowel: Focal wall thickening is noted at the mid sigmoid colon, with associated soft tissue inflammation and inflamed diverticula, concerning for acute diverticulitis. Minimal underlying diverticulosis is noted. There is no evidence of perforation or abscess formation at this time. Vascular/Lymphatic: Scattered calcification is seen along the abdominal aorta and its branches. The abdominal aorta is otherwise grossly unremarkable. The inferior vena cava is grossly unremarkable. No retroperitoneal lymphadenopathy is seen. No pelvic sidewall lymphadenopathy is identified. Reproductive: Soft tissue inflammation about the bladder raises concern for cystitis. The prostate is borderline enlarged. Other: No additional soft tissue abnormalities are seen. Musculoskeletal: No acute osseous abnormalities are identified. Intervertebral disc space narrowing is noted at L4-L5. Multilevel vacuum phenomenon is noted along the lumbar spine. The visualized musculature is unremarkable in appearance. IMPRESSION: 1. Acute diverticulitis at the mid sigmoid colon, with soft tissue inflammation and focal wall thickening. No evidence of perforation or abscess formation at this time. Underlying mass cannot be excluded. Sigmoidoscopy could be considered after completion of treatment for diverticulitis, if deemed clinically appropriate. 2. Soft tissue inflammation about the bladder raises concern for  cystitis. 3. Small to moderate bilateral pleural effusions, with partial consolidation of both lower lung lobes. 4. Cholelithiasis and likely sludge within the gallbladder. Mild soft tissue inflammation about the gallbladder may reflect cholecystitis, or may reflect motion  artifact. Would correlate with the patient's LFTs. 5. Minimal underlying diverticulosis at the sigmoid colon. 6. Borderline enlarged prostate. 7. Scattered aortic atherosclerosis. 8. Diffuse coronary artery calcifications seen. Electronically Signed   By: Garald Balding M.D.   On: 06/11/2017 00:46   Ct Head Wo Contrast  Result Date: 06/11/2017 CLINICAL DATA:  Altered mental status.  Weakness. EXAM: CT HEAD WITHOUT CONTRAST TECHNIQUE: Contiguous axial images were obtained from the base of the skull through the vertex without intravenous contrast. COMPARISON:  Head CT 11/23/2016 FINDINGS: Brain: Mass lesion in the sella/ suprasellar cistern is similar appearance to prior CT. No evidence of progression. No intracranial hemorrhage. No acute ischemia. Stable atrophy and chronic white matter disease. Small evidence days in both basal ganglia suggest prior lacunar infarcts versus prominent perivascular spaces. No hydrocephalus, the basilar cisterns are patent. Vascular: Atherosclerosis of skullbase vasculature without hyperdense vessel or abnormal calcification. Skull: No focal lesion or skull fracture. Sinuses/Orbits: Paranasal sinuses and mastoid air cells are clear. The visualized orbits are unremarkable. Probable bilateral cataract section. Other: None. IMPRESSION: 1.  No acute intracranial abnormality. 2. Stable CT appearance of sellar/ suprasellar mass. No evidence of significant progression from exam 7 months prior. 3. Stable atrophy and chronic small vessel ischemia. Electronically Signed   By: Jeb Levering M.D.   On: 06/11/2017 00:44   Dg Chest Portable 1 View  Result Date: 06/10/2017 CLINICAL DATA:  Sepsis EXAM: PORTABLE CHEST 1  VIEW COMPARISON:  11/22/2016 FINDINGS: Cardiac shadow is enlarged. Bilateral pleural effusions are noted left greater than right. There is likely underlying basilar atelectasis present as well. No bony abnormality is seen. IMPRESSION: Bilateral pleural effusions with associated atelectatic changes. Electronically Signed   By: Inez Catalina M.D.   On: 06/10/2017 21:17   Scheduled Meds: . insulin aspart  0-5 Units Subcutaneous QHS  . insulin aspart  0-9 Units Subcutaneous TID WC   Continuous Infusions: . sodium chloride    . [START ON 06/12/2017] ciprofloxacin    . famotidine (PEPCID) IV Stopped (06/11/17 1009)  . metronidazole Stopped (06/11/17 1429)  .  sodium bicarbonate (isotonic) infusion in sterile water 75 mL/hr at 06/11/17 1138    LOS: 1 day   Kerney Elbe, DO [facility] Pager [phone]  If 7PM-7AM, please contact night-coverage [email or web address] Password [facility] 06/11/2017, 8:15 PM

## 2017-06-11 NOTE — Progress Notes (Signed)
Asked MD about patient's NPO status on arrival to unit.  MD stated he would order a Speech consult for a swallowing screen before allowing patient to have a diet due to patient's acute encephalopathy.

## 2017-06-11 NOTE — ED Notes (Signed)
Attempted report x1. 

## 2017-06-11 NOTE — ED Notes (Addendum)
While hanging pt meds, pt was talking to ''someone" not present in room telling them to "get off the phone" and has made several other comments that could not be understood.

## 2017-06-11 NOTE — ED Notes (Signed)
Updated on pt status / spoke with admitting MD.

## 2017-06-11 NOTE — Care Management Note (Signed)
Case Management Note  Patient Details  Name: John Barr MRN: [id number] Date of Birth: November 28, 1926  Subjective/Objective:                  From home 83 y.o. 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  Action/Plan: Admit status INPATIENT (Acute metabolic encephalopathy); anticipate discharge [facility] [facility].   Expected Discharge Date:   (unknown)               Expected Discharge Plan:  [place]  In-House Referral:  NA  Discharge planning Services  CM Consult  Status of Service:  In process, will continue to follow  If discussed at Long Length of Stay Meetings, dates discussed:    Additional Comments: Pt goes to [facility]  Fuller Mandril, RN 06/11/2017, 9:07 AM

## 2017-06-11 NOTE — Progress Notes (Signed)
Patient arrived to 4E room 19.  Telemetry monitor applied and CCMD notified.  Patient oriented to unit and room to include call light and phone.  Will continue to monitor. 

## 2017-06-11 NOTE — ED Notes (Signed)
1st unit PRBC completed with no adverse effect , pt. Sleeping with no distress, respirations unlabored , IV sites intact .

## 2017-06-11 NOTE — ED Notes (Signed)
ED Provider at bedside. 

## 2017-06-11 NOTE — Progress Notes (Signed)
PHARMACY NOTE:  ANTIMICROBIAL RENAL DOSAGE ADJUSTMENT  Current antimicrobial regimen includes a mismatch between antimicrobial dosage and estimated renal function.  Pharmacy assisting with Cipro dosing; dosage will be adjusted accordingly.  Current antimicrobial dosage:  Cipro 400 mg IV q12h  Indication: intra-abdominal infection/ diverticulitis  Renal Function:  Estimated Creatinine Clearance: 12.7 mL/min (A) (by C-G formula based on SCr of 4.1 mg/dL (H)). []      On intermittent HD, scheduled: []      On CRRT    Antimicrobial dosage has been changed to:  Cipro 400 mg IV q24hrs  Additional comments:  Will follow renal function for any need to further adjust regimen.   Thank you for allowing pharmacy to be a part of this patient's care.  Arty Baumgartner, [facility]  Pager: [phone] 06/11/2017 4:07 PM

## 2017-06-11 NOTE — ED Notes (Signed)
Bladder scanned pt : 32mL in bladder per scan

## 2017-06-11 NOTE — Progress Notes (Signed)
Patient bladder scanned and noted to have 123 ml of urine in bladder.  Will continue to monitor.

## 2017-06-11 NOTE — ED Notes (Addendum)
Back from downtime : Pt. sleeping with no distress, respirations unlabored , IV sites intact , 1st unit PRBC infusing 300 ml/hr with no adverse effect . Respirations unlabored , Retail banker continues .

## 2017-06-11 NOTE — ED Notes (Signed)
Bair Hugger discontinued , T= 98.5 ( oral ) , warm blankets applied , sleeping with no distress , respirations unlabored , IV sites intact .

## 2017-06-11 NOTE — ED Notes (Signed)
Spoke with MD

## 2017-06-11 NOTE — Progress Notes (Signed)
Pharmacy Antibiotic Note  Priscilla Finklea is a 83 y.o. male admitted on 06/10/2017 with acute diverticulitis.  Pharmacy has been consulted for Cipro dosing.  Vanc/Zosyn d/c'd by admitting MD.  Plan: Cipro 400mg  IV Q12H. Flagyl ordered appropriately by MD.  Height: 5\' 7"  (170.2 cm) Weight: 180 lb (81.6 kg) IBW/kg (Calculated) : 66.1  Temp (24hrs), Avg:92.2 F (33.4 C), Min:89.9 F (32.2 C), Max:94.4 F (34.7 C)   Recent Labs Lab 06/10/17 2037 06/10/17 2049 06/10/17 2108 06/10/17 2341 06/11/17 0101  WBC 6.8  --   --   --  7.3  CREATININE 4.10*  --  4.30*  --  3.75*  LATICACIDVEN  --  1.31  --  0.80  --     Estimated Creatinine Clearance: 13.9 mL/min (A) (by C-G formula based on SCr of 3.75 mg/dL (H)).    No Known Allergies   Thank you for allowing pharmacy to be a part of this patient's care.  Wynona Neat, PharmD, BCPS  06/11/2017 2:55 AM

## 2017-06-12 ENCOUNTER — Other Ambulatory Visit: Payer: Self-pay

## 2017-06-12 ENCOUNTER — Inpatient Hospital Stay (HOSPITAL_COMMUNITY): Payer: Medicare Other

## 2017-06-12 DIAGNOSIS — Z66 Do not resuscitate: Secondary | ICD-10-CM

## 2017-06-12 DIAGNOSIS — R001 Bradycardia, unspecified: Secondary | ICD-10-CM

## 2017-06-12 DIAGNOSIS — I361 Nonrheumatic tricuspid (valve) insufficiency: Secondary | ICD-10-CM

## 2017-06-12 DIAGNOSIS — J90 Pleural effusion, not elsewhere classified: Secondary | ICD-10-CM

## 2017-06-12 DIAGNOSIS — N185 Chronic kidney disease, stage 5: Secondary | ICD-10-CM

## 2017-06-12 DIAGNOSIS — G9341 Metabolic encephalopathy: Secondary | ICD-10-CM

## 2017-06-12 DIAGNOSIS — I10 Essential (primary) hypertension: Secondary | ICD-10-CM

## 2017-06-12 DIAGNOSIS — N170 Acute kidney failure with tubular necrosis: Secondary | ICD-10-CM

## 2017-06-12 DIAGNOSIS — Z515 Encounter for palliative care: Secondary | ICD-10-CM

## 2017-06-12 DIAGNOSIS — E7800 Pure hypercholesterolemia, unspecified: Secondary | ICD-10-CM

## 2017-06-12 LAB — ECHOCARDIOGRAM COMPLETE
AOASC: 34 cm
AVLVOTPG: 2 mmHg
CHL CUP DOP CALC LVOT VTI: 17.3 cm
E/e' ratio: 15
FS: 30 % (ref 28–44)
HEIGHTINCHES: 67 in
IV/PV OW: 1
LA diam end sys: 38 mm
LA diam index: 1.97 cm/m2
LA vol: 62.7 mL
LASIZE: 38 mm
LAVOLA4C: 59.6 mL
LAVOLIN: 32.5 mL/m2
LDCA: 3.46 cm2
LV E/e' medial: 15
LV E/e'average: 15
LV PW d: 11 mm — AB (ref 0.6–1.1)
LV TDI E'MEDIAL: 8.27
LVELAT: 7.4 cm/s
LVOT SV: 60 mL
LVOT diameter: 21 mm
LVOT peak vel: 63.5 cm/s
Lateral S' vel: 11.1 cm/s
MV pk E vel: 111 m/s
MVPG: 5 mmHg
MVPKAVEL: 92.2 m/s
RV sys press: 41 mmHg
Reg peak vel: 307 cm/s
TAPSE: 21.4 mm
TDI e' lateral: 7.4
TR max vel: 307 cm/s
Weight: 2880 oz

## 2017-06-12 LAB — CBC
HCT: 25.9 % — ABNORMAL LOW (ref 39.0–52.0)
Hemoglobin: 8.2 g/dL — ABNORMAL LOW (ref 13.0–17.0)
MCH: 28.1 pg (ref 26.0–34.0)
MCHC: 31.7 g/dL (ref 30.0–36.0)
MCV: 88.7 fL (ref 78.0–100.0)
PLATELETS: 170 10*3/uL (ref 150–400)
RBC: 2.92 MIL/uL — ABNORMAL LOW (ref 4.22–5.81)
RDW: 19.5 % — AB (ref 11.5–15.5)
WBC: 10.1 10*3/uL (ref 4.0–10.5)

## 2017-06-12 LAB — TYPE AND SCREEN
ABO/RH(D): AB POS
Antibody Screen: NEGATIVE
Unit division: 0

## 2017-06-12 LAB — GLUCOSE, CAPILLARY
GLUCOSE-CAPILLARY: 101 mg/dL — AB (ref 65–99)
GLUCOSE-CAPILLARY: 105 mg/dL — AB (ref 65–99)
GLUCOSE-CAPILLARY: 116 mg/dL — AB (ref 65–99)
Glucose-Capillary: 150 mg/dL — ABNORMAL HIGH (ref 65–99)
Glucose-Capillary: 102 mg/dL — ABNORMAL HIGH (ref 65–99)

## 2017-06-12 LAB — CBC WITH DIFFERENTIAL/PLATELET
BASOS ABS: 0 10*3/uL (ref 0.0–0.1)
BASOS PCT: 0 %
EOS ABS: 0 10*3/uL (ref 0.0–0.7)
EOS PCT: 0 %
HCT: 23.7 % — ABNORMAL LOW (ref 39.0–52.0)
Hemoglobin: 7.6 g/dL — ABNORMAL LOW (ref 13.0–17.0)
Lymphocytes Relative: 10 %
Lymphs Abs: 0.8 10*3/uL (ref 0.7–4.0)
MCH: 28.4 pg (ref 26.0–34.0)
MCHC: 32.1 g/dL (ref 30.0–36.0)
MCV: 88.4 fL (ref 78.0–100.0)
MONO ABS: 1.1 10*3/uL — AB (ref 0.1–1.0)
Monocytes Relative: 13 %
Neutro Abs: 6.5 10*3/uL (ref 1.7–7.7)
Neutrophils Relative %: 77 %
PLATELETS: 157 10*3/uL (ref 150–400)
RBC: 2.68 MIL/uL — ABNORMAL LOW (ref 4.22–5.81)
RDW: 19.6 % — AB (ref 11.5–15.5)
WBC: 8.5 10*3/uL (ref 4.0–10.5)

## 2017-06-12 LAB — BPAM RBC
BLOOD PRODUCT EXPIRATION DATE: 201808292359
ISSUE DATE / TIME: 201808160145
UNIT TYPE AND RH: 8400

## 2017-06-12 LAB — URINE CULTURE: CULTURE: NO GROWTH

## 2017-06-12 LAB — COMPREHENSIVE METABOLIC PANEL
ALBUMIN: 3 g/dL — AB (ref 3.5–5.0)
ALT: 71 U/L — ABNORMAL HIGH (ref 17–63)
AST: 62 U/L — AB (ref 15–41)
Alkaline Phosphatase: 92 U/L (ref 38–126)
Anion gap: 13 (ref 5–15)
BUN: 83 mg/dL — AB (ref 6–20)
CHLORIDE: 117 mmol/L — AB (ref 101–111)
CO2: 15 mmol/L — AB (ref 22–32)
Calcium: 8.1 mg/dL — ABNORMAL LOW (ref 8.9–10.3)
Creatinine, Ser: 4.31 mg/dL — ABNORMAL HIGH (ref 0.61–1.24)
GFR calc Af Amer: 13 mL/min — ABNORMAL LOW (ref >60)
GFR, EST NON AFRICAN AMERICAN: 11 mL/min — AB (ref >60)
Glucose, Bld: 137 mg/dL — ABNORMAL HIGH (ref 65–99)
POTASSIUM: 4.9 mmol/L (ref 3.5–5.1)
SODIUM: 145 mmol/L (ref 135–145)
Total Bilirubin: 0.5 mg/dL (ref 0.3–1.2)
Total Protein: 6.1 g/dL — ABNORMAL LOW (ref 6.5–8.1)

## 2017-06-12 LAB — PHOSPHORUS: Phosphorus: 5.4 mg/dL — ABNORMAL HIGH (ref 2.5–4.6)

## 2017-06-12 LAB — MAGNESIUM: MAGNESIUM: 2.2 mg/dL (ref 1.7–2.4)

## 2017-06-12 MED ORDER — FENTANYL CITRATE (PF) 100 MCG/2ML IJ SOLN
25.0000 ug | INTRAMUSCULAR | Status: DC | PRN
  Administered 2017-06-13: 25 ug via INTRAVENOUS
  Filled 2017-06-12: qty 2

## 2017-06-12 MED ORDER — IPRATROPIUM-ALBUTEROL 0.5-2.5 (3) MG/3ML IN SOLN
3.0000 mL | Freq: Four times a day (QID) | RESPIRATORY_TRACT | Status: DC
  Administered 2017-06-12 – 2017-06-14 (×10): 3 mL via RESPIRATORY_TRACT
  Filled 2017-06-12 (×10): qty 3

## 2017-06-12 MED ORDER — FAMOTIDINE IN NACL 20-0.9 MG/50ML-% IV SOLN
20.0000 mg | INTRAVENOUS | Status: DC
Start: 2017-06-13 — End: 2017-06-15
  Administered 2017-06-13 – 2017-06-15 (×3): 20 mg via INTRAVENOUS
  Filled 2017-06-12 (×3): qty 50

## 2017-06-12 NOTE — Progress Notes (Signed)
Patient heart rate drops to the 20's but comes back up again in the 60's. Non sustaining. MD on call notified. Per MD, I should  keep monitoring. To notify if the heart rate goes below 50 and sustaining.

## 2017-06-12 NOTE — Consult Note (Signed)
Cardiology Consultation:   Patient ID: John Barr; [id number]; Dec 20, 1926   Admit date: 06/10/2017 Date of Consult: 06/12/2017  Primary Care Provider: Mahlon Gammon, MD Primary Cardiologist: None  Primary Electrophysiologist:  None   Patient Profile:   John Barr is a 83 y.o. 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 Alfredia Ferguson.   History of Present Illness:   John Barr has multiple medical conditions at this time including confusion, acute diverticulitis (on CT scan), sepsis (hypotension and hypothermia; UTI?), electrolyte disturbance (NA and K), significant anemia and worsening renal function.   He was admitted to the SDU with transient improvement in his mental status but he then started to hallucinate. He became acidotic and his  IV fluids were changed to Bicarb gtt. We have been consulted because he has been having intermittent pauses (up to 5 seconds) on telemetry and marked bradycardia with rates dropping into the 20-40's;  recovering into the 50-60's. The nurse reports episodes of asystole up to 30 seconds but the patient was awake and talking during these episodes-- she did not check pulses.  John Barr is currently full code, the hospitalists has documented initiating a goals of care plan with patient and his family members. --- It appears that palliative medicine has consulting and note is pending.  He is currently awake but confused. No family members are present to obtain more history from. He believes it is currently 35 but knows he is in the hospital and who he is.   He endorses not feeling well.     Past Medical History:  Diagnosis Date  . Arthritis   . Diabetes mellitus without complication ([place])    . Exposure to TB    hx  . GERD (gastroesophageal reflux disease)   . High frequency hearing loss   . Hypercholesterolemia   . Hypertension     Past Surgical History:  Procedure Laterality Date  . vocal cord repair  11/87     Inpatient Medications: Scheduled Meds: . insulin aspart  0-5 Units Subcutaneous QHS  . insulin aspart  0-9 Units Subcutaneous TID WC  . ipratropium-albuterol  3 mL Nebulization Q6H   Continuous Infusions: . sodium chloride    . ciprofloxacin Stopped (06/12/17 0932)  . [START ON 06/13/2017] famotidine (PEPCID) IV    . metronidazole 500 mg (06/12/17 1419)   PRN Meds: acetaminophen **OR** acetaminophen, morphine injection, ondansetron  Allergies:   No Known Allergies  Social History:   Social History   Social History  . Marital status: Married    Spouse name: N/A  . Number of children: N/A  . Years of education: N/A   Occupational History  . Not on file.   Social History Main Topics  . Smoking status: Former Smoker    Types: Cigarettes    Quit date: 10/31/1971  . Smokeless tobacco: Never Used  . Alcohol use No  . Drug use: No  . Sexual activity: Not Currently   Other Topics Concern  . Not on file   Social History Narrative  . No narrative on file    Family History:   Family History  Problem Relation Age of Onset  . Diabetes Mellitus II Mother   . Kidney disease Mother   . Heart disease Father   . Diabetes Mellitus II Father   .  Breast cancer Sister      ROS:  Please see the history of present illness.  ROS  Unable to obtain accurate ROS as patient is somewhat confused.  Physical Exam/Data:   Vitals:   06/12/17 0900 06/12/17 0928 06/12/17 1356 06/12/17 1400  BP:   115/69 129/70  Pulse: 68  (!) 54 (!) 59  Resp: 18  (!) 22 17  Temp:      TempSrc:      SpO2: 95% 95% 98% 100%  Weight:      Height:        Intake/Output Summary (Last 24 hours) at 06/12/17 1502 Last data filed at 06/12/17 1048  Gross per 24 hour  Intake                 0 ml  Output              125 ml  Net             -125 ml   Filed Weights   06/10/17 2022  Weight: 180 lb (81.6 kg)   Body mass index is 28.19 kg/m.  General:  Well nourished, well developed, generally weak appearing HEENT: normal Lymph: no adenopathy Neck: no JVD Endocrine:  No thryomegaly Vascular: No carotid bruits; FA pulses 2+ bilaterally without bruits  Cardiac:  Normal sinus rhythm but he does frequent drop into bradycardia rates of 40s while im in the room. Lungs:  + audible wheezing, on nasal cannula, abnormal phonation (friend says his voice is at baseline) Abd: soft, nontender, no hepatomegaly  Ext: no edema Musculoskeletal:  No deformities, BUE and BLE strength normal and equal Skin: warm and dry  Neuro:  Oriented to person and place. Not oriented to time. Will follow simple commands. (at baseline he is A& O x 3) Psych:  Normal affect   EKG:  The EKG was personally reviewed and demonstrates:  Sinus rhythm with PVCs Telemetry:  Telemetry was personally reviewed and demonstrates:  Normal sinus rhythm with frequent bradycardia, rates to between 20-40s. He does show a few episodes of lead disruption that have been re-labeled to asystole.  Relevant CV Studies: Currently obtaining echocardiogram  Laboratory Data:  Chemistry Recent Labs Lab 06/11/17 0830 06/11/17 1232 06/11/17 1602 06/12/17 0522  NA 143 146*  --  145  K 6.0* 5.1  --  4.9  CL 119* 121*  --  117*  CO2 14* 14*  --  15*  GLUCOSE 138* 142* 160* 137*  BUN 79* 81*  --  83*  CREATININE 3.95* 4.10*  --  4.31*  CALCIUM 8.0* 8.1*  --  8.1*  GFRNONAA 12* 12*  --  11*  GFRAA 14* 14*  --  13*  ANIONGAP 10 11  --  13     Recent Labs Lab 06/10/17 2037 06/12/17 0522  PROT 6.7 6.1*  ALBUMIN 3.2* 3.0*  AST 31 62*  ALT 29 71*  ALKPHOS 71 92  BILITOT 0.4 0.5   Hematology Recent Labs Lab 06/11/17 1843 06/12/17 0105 06/12/17 0522  WBC 8.9 10.1 8.5  RBC 2.68* 2.92* 2.68*  HGB 7.7*  8.2* 7.6*  HCT 24.3* 25.9* 23.7*  MCV 90.7 88.7 88.4  MCH 28.7 28.1 28.4  MCHC 31.7 31.7 32.1  RDW 19.9* 19.5* 19.6*  PLT 157 170 157   Cardiac EnzymesNo results for input(s): TROPONINI in the last 168 hours. No results for input(s): TROPIPOC in the last 168 hours.  BNP Recent Labs Lab 06/10/17 2037  BNP  361.3*    DDimer No results for input(s): DDIMER in the last 168 hours.  Radiology/Studies:  Ct Abdomen Pelvis Wo Contrast  Result Date: 06/11/2017 CLINICAL DATA:  Acute onset of generalized weakness. Nausea. Initial encounter. EXAM: CT ABDOMEN AND PELVIS WITHOUT CONTRAST TECHNIQUE: Multidetector CT imaging of the abdomen and pelvis was performed following the standard protocol without IV contrast. COMPARISON:  Renal ultrasound from 11/22/2016 FINDINGS: Lower chest: Small to moderate bilateral pleural effusions are noted, with partial consolidation of both lower lung lobes. Diffuse coronary artery calcifications are seen. Hepatobiliary: The liver is unremarkable. Stones and likely sludge are noted within the gallbladder. Mild soft tissue inflammation about the gallbladder may reflect cholecystitis. The common bile duct remains normal in caliber. Pancreas: The pancreas is within normal limits. Spleen: The spleen is unremarkable in appearance. Adrenals/Urinary Tract: The adrenal glands are unremarkable in appearance. Diffuse perinephric stranding is noted bilaterally. There is no evidence of hydronephrosis. No renal or ureteral stones are identified. Stomach/Bowel: Focal wall thickening is noted at the mid sigmoid colon, with associated soft tissue inflammation and inflamed diverticula, concerning for acute diverticulitis. Minimal underlying diverticulosis is noted. There is no evidence of perforation or abscess formation at this time. Vascular/Lymphatic: Scattered calcification is seen along the abdominal aorta and its branches. The abdominal aorta is otherwise grossly unremarkable. The inferior  vena cava is grossly unremarkable. No retroperitoneal lymphadenopathy is seen. No pelvic sidewall lymphadenopathy is identified. Reproductive: Soft tissue inflammation about the bladder raises concern for cystitis. The prostate is borderline enlarged. Other: No additional soft tissue abnormalities are seen. Musculoskeletal: No acute osseous abnormalities are identified. Intervertebral disc space narrowing is noted at L4-L5. Multilevel vacuum phenomenon is noted along the lumbar spine. The visualized musculature is unremarkable in appearance. IMPRESSION: 1. Acute diverticulitis at the mid sigmoid colon, with soft tissue inflammation and focal wall thickening. No evidence of perforation or abscess formation at this time. Underlying mass cannot be excluded. Sigmoidoscopy could be considered after completion of treatment for diverticulitis, if deemed clinically appropriate. 2. Soft tissue inflammation about the bladder raises concern for cystitis. 3. Small to moderate bilateral pleural effusions, with partial consolidation of both lower lung lobes. 4. Cholelithiasis and likely sludge within the gallbladder. Mild soft tissue inflammation about the gallbladder may reflect cholecystitis, or may reflect motion artifact. Would correlate with the patient's LFTs. 5. Minimal underlying diverticulosis at the sigmoid colon. 6. Borderline enlarged prostate. 7. Scattered aortic atherosclerosis. 8. Diffuse coronary artery calcifications seen. Electronically Signed   By: Garald Balding M.D.   On: 06/11/2017 00:46   Ct Head Wo Contrast  Result Date: 06/11/2017 CLINICAL DATA:  Altered mental status.  Weakness. EXAM: CT HEAD WITHOUT CONTRAST TECHNIQUE: Contiguous axial images were obtained from the base of the skull through the vertex without intravenous contrast. COMPARISON:  Head CT 11/23/2016 FINDINGS: Brain: Mass lesion in the sella/ suprasellar cistern is similar appearance to prior CT. No evidence of progression. No  intracranial hemorrhage. No acute ischemia. Stable atrophy and chronic white matter disease. Small evidence days in both basal ganglia suggest prior lacunar infarcts versus prominent perivascular spaces. No hydrocephalus, the basilar cisterns are patent. Vascular: Atherosclerosis of skullbase vasculature without hyperdense vessel or abnormal calcification. Skull: No focal lesion or skull fracture. Sinuses/Orbits: Paranasal sinuses and mastoid air cells are clear. The visualized orbits are unremarkable. Probable bilateral cataract section. Other: None. IMPRESSION: 1.  No acute intracranial abnormality. 2. Stable CT appearance of sellar/ suprasellar mass. No evidence of significant progression from exam 7  months prior. 3. Stable atrophy and chronic small vessel ischemia. Electronically Signed   By: Jeb Levering M.D.   On: 06/11/2017 00:44   Dg Chest Portable 1 View  Result Date: 06/10/2017 CLINICAL DATA:  Sepsis EXAM: PORTABLE CHEST 1 VIEW COMPARISON:  11/22/2016 FINDINGS: Cardiac shadow is enlarged. Bilateral pleural effusions are noted left greater than right. There is likely underlying basilar atelectasis present as well. No bony abnormality is seen. IMPRESSION: Bilateral pleural effusions with associated atelectatic changes. Electronically Signed   By: Inez Catalina M.D.   On: 06/10/2017 21:17    Assessment and Plan:    1. Bradycardia: Patient is dropping his pulse into the 20-40's frequently as well as brief episodes of asystole. Echocardiogram is currently pending for him. He is not on any AV nodal blocking agents. -- Mr. Kester is not a good candidate for pacemaker. Will see what goals of care are established with palliative care. -- We recommend discontinuing telemetry monitor at this time  2. Metabolic derangement with acute metabolic encephalopathy: Internal Medicine managing. -- hypernatremia, hyperkalemia -- on bicarb gtt.  3. Sepsis: UTI vs acute diverticulitis. On IV abx. Internal  Medicine managing  4. Stage V CKD: hospitalists will be touching base with palliative care to discuss to decide if Nephrology needs to consult. -- internal medicine managing  5. Hypertension: not currently receiving any BP medications at this time. BP low normal to normal.  5. Brain Mass: suprasellar and sellar mass, per chart review this is inoperable as he is nota surgical candidate. -- internal medicine managing.   Kristopher Glee, PA-C  06/12/2017 @  Agree with note by Delos Haring PA-C  Mr. Humber is an 83 year old African-American male who we are asked to see because of sinus bradycardia and pauses. He is chronically ill appearing with multiple medical problems including acute metabolic encephalopathy, sepsis, stage V chronic kidney disease, hypertension and a brain mass. His creatinines running in the 3 range. He is being treated for sepsis of unknown etiology. On telemetry he has had sinus bradycardia with pauses of up to 5 seconds though has been a symptomatically from these. He is on no negative chronotropic medications. I think. Question is goals of care and whether or not he should be a full code. In any event, I do not think he is a candidate for a permanent transvenous pacemaker I recommend taking him off telemetry.  Lorretta Harp, M.D., [place], [facility], [place] [address] [place], [place]  [address]  [phone] 06/12/2017 4:38 PM

## 2017-06-12 NOTE — Evaluation (Signed)
Clinical/Bedside Swallow Evaluation Patient Details  Name: John Barr MRN: [id number] Date of Birth: 1927-08-19  Today's Date: 06/12/2017 Time: SLP Start Time (ACUTE ONLY): 4098 SLP Stop Time (ACUTE ONLY): 0837 SLP Time Calculation (min) (ACUTE ONLY): 9 min  Past Medical History:  Past Medical History:  Diagnosis Date  . Arthritis   . Diabetes mellitus without complication ([place])   . Exposure to TB    hx  . GERD (gastroesophageal reflux disease)   . High frequency hearing loss   . Hypercholesterolemia   . Hypertension    Past Surgical History:  Past Surgical History:  Procedure Laterality Date  . 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 CXR Bilateral pleural effusions with associated atelectatic changes. CT head no acute changes. Found to have acute metabolic encephalopathy likely due to multifactorial etiologies, including acute diverticulitis, sepsis, electrolytes disturbance, worsening renal function, abdominal distention, sepsis and possible UTI.   Assessment / Plan / Recommendation Clinical Impression  John Barr alert, HOH, and need cueing during bedside swallow assessment. Suspect pharyngeal dysphagia marked by immediate throat clear, possible delayed initiation of swallow following consecutive sips water. Pt's heart rate fell into the 20's. SLP lowered head of bed and HR climbed into 60's. RN arrived to monitor pt. Recommend continue NPO status. SLP will follow for po readiness versus need for objective assessment.  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 CXR Bilateral pleural effusions with associated atelectatic changes. CT head no acute changes. Found to have acute metabolic encephalopathy likely due to multifactorial etiologies, including acute diverticulitis, sepsis, electrolytes disturbance, worsening renal function, abdominal distention, sepsis and possible UTI. Type of Study: Bedside Swallow Evaluation Previous Swallow Assessment:  (none) Diet Prior to this Study: NPO Temperature Spikes Noted: No Respiratory Status: Room air History of Recent Intubation: No Behavior/Cognition: Alert;Confused;Cooperative;Requires cueing Oral Cavity Assessment: Within Functional Limits Oral Care Completed by SLP: No Oral Cavity - Dentition: Adequate natural dentition Vision: Functional for self-feeding Self-Feeding Abilities: Able to feed self Patient Positioning: Upright in bed Baseline Vocal Quality: Low vocal intensity Volitional Cough: Strong Volitional Swallow: Able to elicit    Oral/Motor/Sensory Function Overall Oral Motor/Sensory Function: Generalized oral weakness   Ice Chips Ice chips: Not tested   Thin Liquid Thin Liquid: Impaired Presentation: Cup Pharyngeal  Phase Impairments: Suspected delayed Swallow;Throat Clearing - Immediate;Decreased hyoid-laryngeal movement    Nectar Thick Nectar Thick Liquid: Not tested   Honey Thick Honey Thick Liquid: Not tested   Puree Puree: Not tested   Solid   GO    Solid: Not tested        Houston Siren 06/12/2017,8:48 AM  Orbie [employer] [phone]

## 2017-06-12 NOTE — Progress Notes (Signed)
Pt. HR has dropped in the 20's and was asystole before HR came up to 61. Right after MD came into room made aware of telemetry changes new orders placed.

## 2017-06-12 NOTE — Consult Note (Signed)
Consultation Note Date: 06/12/2017   Patient Name: John Barr  DOB: Dec 30, 1926  MRN: [id number]  Age / Sex: 83 y.o., male  PCP: John Gammon, MD Referring Physician: Kerney Elbe, DO    ADDENDUM:  After our conversation I spoke with Dr. Alfredia Barr again who let me know John Barr is not a candidate for a pacemaker.  Unfortunately this means he is not a candidate for hemodialysis either.  I called [name] once again.  She understands that this means he is near end of life.  We discussed code status.  I explained again that he would not survive a code.  She understood and changed his code status to DNR.  I told her that PMT will call tomorrow to discuss whether he should be discharged to home at the end of the hospitalization  - or should he be discharged to [facility].  Reason for Consultation: Establishing goals of care  HPI/Patient Profile: 83 y.o. 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  Treatment in the hospital has been focused on acute on chronic renal failure and encephalopathy.  CT head was negative for acute changes.  On telemetry the patient has begun to have bradycardia with pulse rates dropping into the 20s and then returning to the 60s.   PMT was consulted for [place].   Clinical Assessment and Goals of Care:  I have reviewed medical records including EPIC notes, labs and imaging, received report from the care team.  I visited the patient who was receiving a cardiac echo.  I spoke with his daughter John Barr) on the phone.  I introduced Palliative Medicine as specialized medical care for people living with serious illness. It focuses on providing relief from the symptoms and stress of a serious illness. The goal is to improve quality of life for both the patient and the  family.  We discussed a brief life review of the patient. He spent 27 years in the [employer] and then 18 years working for the [employer].  He receives both his primary care and his renal care at the [facility].  His wife is currently on hemo-dialysis and does very well.  The patient lives at home with his wife and daughter.  He has a bed to chair existence - and needs some assistance with ADLs.    I quickly reviewed the events of the hospitalization with [name].  The bradycardia (20s) was new to her.  We discussed his brain mass (stable), his diverticulitis and his kidney failure.  [name] is the patients HCPOA.  She states that they have never had conversations about whether he would want HD or what an acceptable quality of life for him may be - However, she feels certain that he would not want to be kept alive if he was bedbound or unconscious or suffering with advanced dementia.   In our discussion of code status - at this  point she wants to keep the full code. We discussed that he would be unlikely to survive a code.   I also mentioned that at this point it is unlikely he would be offered HD due to his bradycardia.   I mentioned that we would give her a call tomorrow to schedule a meeting.  In speaking with Dr. Alfredia Barr - a cardiology consultation has been called.  If the patient is unable to have hemodialysis he would likely be best served by [facility] either at home or in [facility].   Primary Decision Maker:  NEXT OF KIN Dtr, POA, John Barr    SUMMARY OF RECOMMENDATIONS    PMT will note cardiology recommendations and plan to meet with the family this weekend to discuss code status and goals of care.  Unless a significant reversal of events takes place - John Barr is dying and will likely be best served by [facility].  Code Status/Advance Care Planning:  DNR  Symptom management  D/C'd morphine and ordered fentanyl 25 - 50 mcg PRN pain or  dyspnea.   Prognosis: days to weeks.  Given current end stage renal failure, encephalopathy and significant bradycardia he is at high risk for an acute event.    Discharge Planning: To Be Determined      Primary Diagnoses: Present on Admission: . Acute renal failure superimposed on stage 5 chronic kidney disease, not on chronic dialysis ([place]) . Brain mass . Hypertension . Hypercholesterolemia . GERD (gastroesophageal reflux disease) . Type II diabetes mellitus with renal manifestations ([place]) . Hypernatremia . Hyperkalemia . Hypotension . Sepsis ([place]) . Hypothermia . Normocytic anemia . Abdominal distension . Acute metabolic encephalopathy . Diverticulitis large intestine   I have reviewed the medical record, interviewed the patient and family, and examined the patient. The following aspects are pertinent.  Past Medical History:  Diagnosis Date  . Arthritis   . Diabetes mellitus without complication ([place])   . Exposure to TB    hx  . GERD (gastroesophageal reflux disease)   . High frequency hearing loss   . Hypercholesterolemia   . Hypertension    Social History   Social History  . Marital status: Married    Spouse name: N/A  . Number of children: N/A  . Years of education: N/A   Social History Main Topics  . Smoking status: Former Smoker    Types: Cigarettes    Quit date: 10/31/1971  . Smokeless tobacco: Never Used  . Alcohol use No  . Drug use: No  . Sexual activity: Not Currently   Other Topics Concern  . None   Social History Narrative  . None   Family History  Problem Relation Age of Onset  . Diabetes Mellitus II Mother   . Kidney disease Mother   . Heart disease Father   . Diabetes Mellitus II Father   . Breast cancer Sister    Scheduled Meds: . insulin aspart  0-5 Units Subcutaneous QHS  . insulin aspart  0-9 Units Subcutaneous TID WC  . ipratropium-albuterol  3 mL Nebulization Q6H   Continuous Infusions: . sodium chloride    .  ciprofloxacin Stopped (06/12/17 3220)  . [START ON 06/13/2017] famotidine (PEPCID) IV    . metronidazole 500 mg (06/12/17 1419)   PRN Meds:.acetaminophen **OR** acetaminophen, morphine injection, ondansetron No Known Allergies Review of Systems patient unable   Physical Exam awake.  In no apparent distress.  Receiving cardiac echo.  Vital Signs: BP 129/70   Pulse Marland Kitchen)  59   Temp (!) 96.7 F (35.9 C) (Oral)   Resp 17   Ht 5\' 7"  (1.702 m)   Wt 81.6 kg (180 lb)   SpO2 100%   BMI 28.19 kg/m  Pain Assessment: No/denies pain   Pain Score: 0-No pain   SpO2: SpO2: 100 % O2 Device:SpO2: 100 % O2 Flow Rate: .O2 Flow Rate (L/min): 3 L/min  IO: Intake/output summary:  Intake/Output Summary (Last 24 hours) at 06/12/17 1541 Last data filed at 06/12/17 1048  Gross per 24 hour  Intake                0 ml  Output              125 ml  Net             -125 ml    LBM: Last BM Date: 06/11/17 Baseline Weight: Weight: 81.6 kg (180 lb) Most recent weight: Weight: 81.6 kg (180 lb)     Palliative Assessment/Data:     Time In: 4:00 Time Out: 4:50 Time Total: 50 min. Greater than 50%  of this time was spent counseling and coordinating care related to the above assessment and plan.  Signed by: Florentina Jenny, PA-C Palliative Medicine Pager: [phone]  Please contact Palliative Medicine Team phone at [phone] for questions and concerns.  For individual provider: See Shea Evans

## 2017-06-12 NOTE — Progress Notes (Signed)
Provide MEDLINE oral care and set up suction at bedside.

## 2017-06-12 NOTE — Consult Note (Signed)
           [facility] CM Primary Care Navigator  06/12/2017  John Barr 01/16/1927 [id number]   Went to see patient at the bedsideto identify possible discharge needs. Patient was noted to have difficulty remembering and slow to respond. Patient agreed for his daughter to be called to gather information about him.  Called and spoke with daughter Adonis Huguenin) who confirmed that patient'sprimary care provider is Dr.Kurt Patience Musca with [facility] which is not under [facility] network.  Patient's daughter reports that he is no longer seeing Dr. Jill Alexanders with [facility] for few years.  Per Inpatient CM note, anticipated discharge plan is home with home health services vs. SNF(skilled nursing facility).  Patient's daughter was encouraged to have patient follow-up with primary care provider after hospital discharge or when he returns home.   For questions, please contact:  Dannielle Huh, BSN, RN- [facility] Primary Care Navigator  Telephone: [phone] [place]

## 2017-06-12 NOTE — Progress Notes (Signed)
PROGRESS NOTE    Tresten Pantoja  IRJ:[id number] DOB: [birth year] DOA: 06/10/2017 PCP: Mahlon Gammon, MD   Brief Narrative:  John Barr is a 83 y.o. 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 Per patient's daughter, patient was recently diagnosed as ESRD and severe anemia with Hgb 7.9 last week. Pt was given Epogen injection twice. Last dose of Epogen injection was last week. Per pt's daughter, patient has increased weakness in the past several days after the Epogen injection, and became confused since yesterday. Patient moves all extremities, no facial droop or slurred speech. Patient had nausea and vomited once yesterday. He possibly had one episode of loose stool per her daughter. Patient does not have abdominal pain per her daughter, but he has abdominal distention. Admitted to SDU and Mental status slightly improved but then patient started hallucinating. Had Metabolic Acidosis so was changed to Bicarb gtt. Discussed with Nephrology and will need to have [place] with Family to assess if they would even consider Dialysis prior to Nephrology getting involved. Patient started becoming Bradycardic and had periods of Asystole so Cardiology was consulted. Palliative Care Medicine was also consulted for Goals of Care and after extensive discussion with the patient's Daughter, daughter elected to change the patient's Code Status to DNR.   Assessment & Plan:   Principal Problem:   Acute metabolic encephalopathy Active Problems:   Acute renal failure superimposed on stage 5 chronic kidney disease, not on chronic dialysis (HCC)   Brain mass   GERD (gastroesophageal reflux disease)   Hypercholesterolemia   Hypertension   Type II diabetes mellitus with renal manifestations (HCC)   Hypernatremia  Hyperkalemia   Hypotension   Sepsis (HCC)   Hypothermia   Normocytic anemia   Abdominal distension   Diverticulitis large intestine   Palliative care encounter   DNR (do not resuscitate)   Bradycardia   Pleural effusion  Acute metabolic encephalopathy:  -Likely due to multifactorial etiologies, including acute diverticulitis, sepsis, electrolytes disturbance, worsening renal function.  -CT head is negative for acute intracranial abnormalities. - Admitted to stepdown as inpatient. - Treat underlying issues as below; ? If Encephalopathic from uremia - frequent neurologic checks - Obtained Swallow Screen and SLP recommending NPO  Abdominal distention:  -CT scan showed acute diverticulitis at the mid sigmoid colon, with soft tissue inflammation and focal wall thickening. No evidence of perforation or abscess formation at this time. Underlying mass cannot be excluded.  -IV Cipro and Flagyl (patient received one dose of vancomycin and Zosyn for sepsis before getting back the CT scan results). -prn zofran for nausea and vomiting and morphine pain -IVF changed to Bicarb gtt yesterday and will continue  -Sigmoidoscopy could be considered after completion of treatment for diverticulitis since underlying mass cannot be excluded.   Possible UTI:  -pt has positive urinalysis with trace amount of leukocytes and many bacteria -On IV antibiotics as above -Urine Cx showed No Growth   Sepsis and hypotension and hypothermia:  -Patient is septic with hypotension and hypothermia. Blood pressure responded to IV fluid.  -Given 2.5 L normal sitting bolus. -IV abx as above -f/u Bx and Ux -given one dose of Solu Cortef 100 mg 1 -f/u cortisol level -will get Procalcitonin and trend lactic acid levels per sepsis protocol. -IVF: 2.5 L of NS bolus in ED, followed by 75 cc of D5-1/4 NS cc/h due  to hypernatremia; Changed IVF to Sodium Bicarbonate 150 mEQ - Had Bear Hugger  AoCKD-V:  -On  AdmissionCreatinine 4.01, BUN 84, potassium 5.2, bicarbonate 17, chloride 121. Pt is not on dialysis yet and unlikely a candidate -continue IVF: 2.5 L NS and then D5-1/4 NS due to hypernatremia but changed to Sodium Bicarb As above -BUN/Cr worsened today to 83/4.31 -Will need GOC discussion with family before Nephro sees for possible Dialysis; Patient is unfortunately not a Dialysis Candidate; Code Status changed to DNR and family meeting in AM -Place Foley Catheter for Accurate I's/O's however was never done  Hyperkalemia:  -K=5.2 on and admission and worsened to 6.0 yesterday -pt was given one dose of Kayexalate and 45 g (this is done before knowing that patient had diverticulitis) -Given Albuterol neb and Bicarb drip and improved to 5.1 and now is 4.9 on 06/12/17 -Repeat CMP in AM   Hypernatremia:  -Improved slightly -Due to dehydration. -IV fluid as above including D5-1/4 NS at 75 cc/hr changed to Sodium Bicarb gtt 150 mEQ -f/u by BMP  DM-II with renal complications:  -Last Q0H 6.6 on 11/17/16, well controled. Patient is taking NovoLog at home -C/w SSI  HTN:  -Hold Amlodipine and Metoprolol due to hypotension and Braydcardia  HLD: -hold Zocor until mental status improves  Metabolic Acidosis -Bicarb level was 14 and slightly improved to 15  -C/w Bicarb gtt  Brain mass:   -Pt has a known suprasellar and sellar mass. Was seen by neurosurgery in [facility]. Patient is not a surgical candidate per neurosurgeon. -Follow up with neurosurgeon  Bradycardia -Pulse has been dropping into the 20's and 40's with episodes of Asystole and Pauses of 5 seconds -Metoprolol had been held on Admission because of Hypotension -ECHOCardiogram ordered and The estimated ejection fraction was in the range of 55% to 60%. Wall motion was normal; there were no regional wall motion abnormalities. The study is not technically sufficient to allow evaluation of LV diastolic function. -Cardiology  Consulted for further evaluation and recommendations -Unfortunately patient is not a good candidate for a permanent Transvenous Pacemaker -Cardiology recommending Discontinuing Telemetry Monitoring   Normocytic anemia:  -Hemoglobin dropped from 8.6 on 11/25/16-7.5, then further to 6.7. No GI bleeding noted. -Anemia panel -check FOBT -Transfused 1 unit of blood and Hb/Hct improved to 7.7/2.43 -f/u CBC q6h  GERD: -Pepcid IV  Left Large Pleural Effusion -Seen on ECHO -May not pursue Thoracentesis pending Palliative Care Discussion with Family -Added DuoNebs scheduled today  DVT prophylaxis: SCDs Code Status: DO NOT RESUSCITATE Family Communication: No family present at bedside but spoke to daughter over the phone Disposition Plan: Remain in SDU  Consultants:   Cardiology  Palliative Care Medicine  Briefly discussed case with Dr. Justin Mend on 8/16   Procedures:  ECHOCARDIOGRAM Study Conclusions  - Left ventricle: The cavity size was normal. There was mild   concentric hypertrophy. Systolic function was normal. The   estimated ejection fraction was in the range of 55% to 60%. Wall   motion was normal; there were no regional wall motion   abnormalities. The study is not technically sufficient to allow   evaluation of LV diastolic function. - Aortic valve: Valve mobility was restricted. Transvalvular   velocity was within the normal range. There was no stenosis.   There was no regurgitation. - Mitral valve: Transvalvular velocity was within the normal range.   There was no evidence for stenosis. There was trivial   regurgitation. - Left atrium: The atrium was mildly dilated. -  Right ventricle: The cavity size was normal. Wall thickness was   normal. Systolic function was normal. - Tricuspid valve: There was mild regurgitation. - Pulmonary arteries: Systolic pressure was moderately increased.   PA peak pressure: 53 mm Hg (S). - Pericardium, extracardiac: A trivial  pericardial effusion was   identified. There was a large left pleural effusion.   Antimicrobials:  Anti-infectives    Start     Dose/Rate Route Frequency Ordered Stop   06/12/17 2200  vancomycin (VANCOCIN) IVPB 1000 mg/200 mL premix  Status:  Discontinued     1,000 mg 200 mL/hr over 60 Minutes Intravenous Every 48 hours 06/10/17 2132 06/11/17 0254   06/12/17 0400  ciprofloxacin (CIPRO) IVPB 400 mg     400 mg 200 mL/hr over 60 Minutes Intravenous Every 24 hours 06/11/17 1607     06/11/17 0600  piperacillin-tazobactam (ZOSYN) IVPB 3.375 g  Status:  Discontinued     3.375 g 12.5 mL/hr over 240 Minutes Intravenous Every 8 hours 06/10/17 2132 06/11/17 0254   06/11/17 0400  metroNIDAZOLE (FLAGYL) IVPB 500 mg     500 mg 100 mL/hr over 60 Minutes Intravenous Every 8 hours 06/11/17 0254     06/11/17 0400  ciprofloxacin (CIPRO) IVPB 400 mg  Status:  Discontinued     400 mg 200 mL/hr over 60 Minutes Intravenous Every 12 hours 06/11/17 0257 06/11/17 1607   06/10/17 2130  piperacillin-tazobactam (ZOSYN) IVPB 3.375 g     3.375 g 100 mL/hr over 30 Minutes Intravenous  Once 06/10/17 2123 06/10/17 2211   06/10/17 2130  vancomycin (VANCOCIN) IVPB 1000 mg/200 mL premix     1,000 mg 200 mL/hr over 60 Minutes Intravenous  Once 06/10/17 2123 06/10/17 2250     Subjective: Seen and examined at bedside and nurse informed me that he went Bradycardic just prior to me seeing the patient. Patient was seen and stated he was SOB. Did not know where he was and thought he was in the store. No CP. Pleasantly confused.   Objective: Vitals:   06/12/17 0900 06/12/17 0928 06/12/17 1356 06/12/17 1400  BP:   115/69 129/70  Pulse: 68  (!) 54 (!) 59  Resp: 18  (!) 22 17  Temp:      TempSrc:      SpO2: 95% 95% 98% 100%  Weight:      Height:        Intake/Output Summary (Last 24 hours) at 06/12/17 1849 Last data filed at 06/12/17 1630  Gross per 24 hour  Intake                0 ml  Output              425 ml    Net             -425 ml   Filed Weights   06/10/17 2022  Weight: 81.6 kg (180 lb)   Examination: Physical Exam:  Constitutional: WN/WD obese AAM in NAD appears calm and pleasantly confused Eyes: Sclerae Anicteric; Lids normal ENMT: External Ears and nose appear normal. Patient is Hard of hearing Neck: Supple with no appreciable JVD Respiratory: Diminished breath sounds with some crackles but no appreciable wheezing. Patient was not tachypenic but was wearing supplemental O2 via [place] Cardiovascular: RRR S1, S2; Had 1+ LE Edema Abdomen: Soft, NT, Distended due to body habitus. Bowel sounds positve GU: Deferred Musculoskeletal: No contractures; no cyanosis Skin: No rashes or lesions noted on a limited skin eval. Neurologic:  CN 2-12 grossly intact and showed no focal deficits Psychiatric: Impaired judgement and insight. Awake but not oriented. Pleasant mood and appropriate affect.   Data Reviewed: I have personally reviewed following labs and imaging studies  CBC:  Recent Labs Lab 06/10/17 2037  06/11/17 0830 06/11/17 1232 06/11/17 1843 06/12/17 0105 06/12/17 0522  WBC 6.8  < > 6.3 7.5 8.9 10.1 8.5  NEUTROABS 4.9  --   --   --   --   --  6.5  HGB 7.5*  < > 8.0* 7.7* 7.7* 8.2* 7.6*  HCT 23.6*  < > 24.9* 24.2* 24.3* 25.9* 23.7*  MCV 90.1  < > 89.6 89.6 90.7 88.7 88.4  PLT 181  < > 167 159 157 170 157  < > = values in this interval not displayed. Basic Metabolic Panel:  Recent Labs Lab 06/10/17 2037 06/10/17 2108 06/11/17 0101 06/11/17 0830 06/11/17 1232 06/11/17 1602 06/12/17 0522  NA 146* 146* 145 143 146*  --  145  K 5.2* 5.2* 5.2* 6.0* 5.1  --  4.9  CL 121* 121* 122* 119* 121*  --  117*  CO2 17*  --  14* 14* 14*  --  15*  GLUCOSE 116* 109* 121* 138* 142* 160* 137*  BUN 84* 81* 77* 79* 81*  --  83*  CREATININE 4.10* 4.30* 3.75* 3.95* 4.10*  --  4.31*  CALCIUM 9.0  --  8.0* 8.0* 8.1*  --  8.1*  MG  --   --   --   --   --   --  2.2  PHOS  --   --   --   --   --    --  5.4*   GFR: Estimated Creatinine Clearance: 12.1 mL/min (A) (by C-G formula based on SCr of 4.31 mg/dL (H)). Liver Function Tests:  Recent Labs Lab 06/10/17 2037 06/12/17 0522  AST 31 62*  ALT 29 71*  ALKPHOS 71 92  BILITOT 0.4 0.5  PROT 6.7 6.1*  ALBUMIN 3.2* 3.0*   No results for input(s): LIPASE, AMYLASE in the last 168 hours.  Recent Labs Lab 06/11/17 0830  AMMONIA 33   Coagulation Profile:  Recent Labs Lab 06/10/17 2037  INR 1.36   Cardiac Enzymes: No results for input(s): CKTOTAL, CKMB, CKMBINDEX, TROPONINI in the last 168 hours. BNP (last 3 results) No results for input(s): PROBNP in the last 8760 hours. HbA1C: No results for input(s): HGBA1C in the last 72 hours. CBG:  Recent Labs Lab 06/11/17 1628 06/11/17 2102 06/12/17 0556 06/12/17 1135 06/12/17 1603  GLUCAP 148* 129* 150* 116* 105*   Lipid Profile: No results for input(s): CHOL, HDL, LDLCALC, TRIG, CHOLHDL, LDLDIRECT in the last 72 hours. Thyroid Function Tests:  Recent Labs  06/10/17 2120  TSH 6.361*   Anemia Panel:  Recent Labs  06/11/17 0000  VITAMINB12 2,234*  FOLATE 22.6  FERRITIN 1,492*  TIBC 185*  IRON 52  RETICCTPCT 4.6*   Sepsis Labs:  Recent Labs Lab 06/10/17 2049 06/10/17 2341 06/11/17 0830  PROCALCITON  --   --  0.19  LATICACIDVEN 1.31 0.80 0.9    Recent Results (from the past 240 hour(s))  Culture, blood (Routine x 2)     Status: None (Preliminary result)   Collection Time: 06/10/17  8:37 PM  Result Value Ref Range Status   Specimen Description BLOOD LEFT FOREARM  Final   Special Requests   Final    BOTTLES DRAWN AEROBIC AND ANAEROBIC Blood Culture adequate volume  Culture NO GROWTH 2 DAYS  Final   Report Status PENDING  Incomplete  Culture, blood (Routine x 2)     Status: None (Preliminary result)   Collection Time: 06/10/17  9:00 PM  Result Value Ref Range Status   Specimen Description BLOOD LEFT ANTECUBITAL  Final   Special Requests   Final     BOTTLES DRAWN AEROBIC AND ANAEROBIC Blood Culture adequate volume   Culture NO GROWTH 2 DAYS  Final   Report Status PENDING  Incomplete  Urine Culture     Status: None   Collection Time: 06/10/17  9:31 PM  Result Value Ref Range Status   Specimen Description URINE, RANDOM  Final   Special Requests NONE  Final   Culture NO GROWTH  Final   Report Status 06/12/2017 FINAL  Final    Radiology Studies: Ct Abdomen Pelvis Wo Contrast  Result Date: 06/11/2017 CLINICAL DATA:  Acute onset of generalized weakness. Nausea. Initial encounter. EXAM: CT ABDOMEN AND PELVIS WITHOUT CONTRAST TECHNIQUE: Multidetector CT imaging of the abdomen and pelvis was performed following the standard protocol without IV contrast. COMPARISON:  Renal ultrasound from 11/22/2016 FINDINGS: Lower chest: Small to moderate bilateral pleural effusions are noted, with partial consolidation of both lower lung lobes. Diffuse coronary artery calcifications are seen. Hepatobiliary: The liver is unremarkable. Stones and likely sludge are noted within the gallbladder. Mild soft tissue inflammation about the gallbladder may reflect cholecystitis. The common bile duct remains normal in caliber. Pancreas: The pancreas is within normal limits. Spleen: The spleen is unremarkable in appearance. Adrenals/Urinary Tract: The adrenal glands are unremarkable in appearance. Diffuse perinephric stranding is noted bilaterally. There is no evidence of hydronephrosis. No renal or ureteral stones are identified. Stomach/Bowel: Focal wall thickening is noted at the mid sigmoid colon, with associated soft tissue inflammation and inflamed diverticula, concerning for acute diverticulitis. Minimal underlying diverticulosis is noted. There is no evidence of perforation or abscess formation at this time. Vascular/Lymphatic: Scattered calcification is seen along the abdominal aorta and its branches. The abdominal aorta is otherwise grossly unremarkable. The inferior  vena cava is grossly unremarkable. No retroperitoneal lymphadenopathy is seen. No pelvic sidewall lymphadenopathy is identified. Reproductive: Soft tissue inflammation about the bladder raises concern for cystitis. The prostate is borderline enlarged. Other: No additional soft tissue abnormalities are seen. Musculoskeletal: No acute osseous abnormalities are identified. Intervertebral disc space narrowing is noted at L4-L5. Multilevel vacuum phenomenon is noted along the lumbar spine. The visualized musculature is unremarkable in appearance. IMPRESSION: 1. Acute diverticulitis at the mid sigmoid colon, with soft tissue inflammation and focal wall thickening. No evidence of perforation or abscess formation at this time. Underlying mass cannot be excluded. Sigmoidoscopy could be considered after completion of treatment for diverticulitis, if deemed clinically appropriate. 2. Soft tissue inflammation about the bladder raises concern for cystitis. 3. Small to moderate bilateral pleural effusions, with partial consolidation of both lower lung lobes. 4. Cholelithiasis and likely sludge within the gallbladder. Mild soft tissue inflammation about the gallbladder may reflect cholecystitis, or may reflect motion artifact. Would correlate with the patient's LFTs. 5. Minimal underlying diverticulosis at the sigmoid colon. 6. Borderline enlarged prostate. 7. Scattered aortic atherosclerosis. 8. Diffuse coronary artery calcifications seen. Electronically Signed   By: Garald Balding M.D.   On: 06/11/2017 00:46   Ct Head Wo Contrast  Result Date: 06/11/2017 CLINICAL DATA:  Altered mental status.  Weakness. EXAM: CT HEAD WITHOUT CONTRAST TECHNIQUE: Contiguous axial images were obtained from the base of the skull  through the vertex without intravenous contrast. COMPARISON:  Head CT 11/23/2016 FINDINGS: Brain: Mass lesion in the sella/ suprasellar cistern is similar appearance to prior CT. No evidence of progression. No  intracranial hemorrhage. No acute ischemia. Stable atrophy and chronic white matter disease. Small evidence days in both basal ganglia suggest prior lacunar infarcts versus prominent perivascular spaces. No hydrocephalus, the basilar cisterns are patent. Vascular: Atherosclerosis of skullbase vasculature without hyperdense vessel or abnormal calcification. Skull: No focal lesion or skull fracture. Sinuses/Orbits: Paranasal sinuses and mastoid air cells are clear. The visualized orbits are unremarkable. Probable bilateral cataract section. Other: None. IMPRESSION: 1.  No acute intracranial abnormality. 2. Stable CT appearance of sellar/ suprasellar mass. No evidence of significant progression from exam 7 months prior. 3. Stable atrophy and chronic small vessel ischemia. Electronically Signed   By: Jeb Levering M.D.   On: 06/11/2017 00:44   Dg Chest Portable 1 View  Result Date: 06/10/2017 CLINICAL DATA:  Sepsis EXAM: PORTABLE CHEST 1 VIEW COMPARISON:  11/22/2016 FINDINGS: Cardiac shadow is enlarged. Bilateral pleural effusions are noted left greater than right. There is likely underlying basilar atelectasis present as well. No bony abnormality is seen. IMPRESSION: Bilateral pleural effusions with associated atelectatic changes. Electronically Signed   By: Inez Catalina M.D.   On: 06/10/2017 21:17   Scheduled Meds: . insulin aspart  0-5 Units Subcutaneous QHS  . insulin aspart  0-9 Units Subcutaneous TID WC  . ipratropium-albuterol  3 mL Nebulization Q6H   Continuous Infusions: . sodium chloride    . ciprofloxacin Stopped (06/12/17 6834)  . [START ON 06/13/2017] famotidine (PEPCID) IV    . metronidazole 500 mg (06/12/17 1419)    LOS: 2 days   Kerney Elbe, DO [facility] Pager [phone]  If 7PM-7AM, please contact night-coverage [email or web address] Password [facility] 06/12/2017, 6:49 PM

## 2017-06-12 NOTE — Progress Notes (Signed)
  Echocardiogram 2D Echocardiogram has been performed.  John Barr 06/12/2017, 5:04 PM

## 2017-06-13 LAB — CBC WITH DIFFERENTIAL/PLATELET
BASOS ABS: 0 10*3/uL (ref 0.0–0.1)
BASOS PCT: 0 %
Eosinophils Absolute: 0.1 10*3/uL (ref 0.0–0.7)
Eosinophils Relative: 2 %
HEMATOCRIT: 24.9 % — AB (ref 39.0–52.0)
HEMOGLOBIN: 8.1 g/dL — AB (ref 13.0–17.0)
Lymphocytes Relative: 9 %
Lymphs Abs: 0.6 10*3/uL — ABNORMAL LOW (ref 0.7–4.0)
MCH: 28.7 pg (ref 26.0–34.0)
MCHC: 32.5 g/dL (ref 30.0–36.0)
MCV: 88.3 fL (ref 78.0–100.0)
MONOS PCT: 10 %
Monocytes Absolute: 0.7 10*3/uL (ref 0.1–1.0)
NEUTROS ABS: 5.2 10*3/uL (ref 1.7–7.7)
Neutrophils Relative %: 79 %
Platelets: 144 10*3/uL — ABNORMAL LOW (ref 150–400)
RBC: 2.82 MIL/uL — AB (ref 4.22–5.81)
RDW: 19.6 % — ABNORMAL HIGH (ref 11.5–15.5)
WBC: 6.6 10*3/uL (ref 4.0–10.5)

## 2017-06-13 LAB — COMPREHENSIVE METABOLIC PANEL
ALBUMIN: 3.1 g/dL — AB (ref 3.5–5.0)
ALK PHOS: 84 U/L (ref 38–126)
ALT: 61 U/L (ref 17–63)
AST: 48 U/L — AB (ref 15–41)
Anion gap: 11 (ref 5–15)
BILIRUBIN TOTAL: 0.5 mg/dL (ref 0.3–1.2)
BUN: 84 mg/dL — AB (ref 6–20)
CALCIUM: 8.3 mg/dL — AB (ref 8.9–10.3)
CO2: 16 mmol/L — AB (ref 22–32)
Chloride: 119 mmol/L — ABNORMAL HIGH (ref 101–111)
Creatinine, Ser: 4.47 mg/dL — ABNORMAL HIGH (ref 0.61–1.24)
GFR calc Af Amer: 12 mL/min — ABNORMAL LOW (ref >60)
GFR calc non Af Amer: 11 mL/min — ABNORMAL LOW (ref >60)
GLUCOSE: 106 mg/dL — AB (ref 65–99)
Potassium: 5.3 mmol/L — ABNORMAL HIGH (ref 3.5–5.1)
Sodium: 146 mmol/L — ABNORMAL HIGH (ref 135–145)
TOTAL PROTEIN: 6.4 g/dL — AB (ref 6.5–8.1)

## 2017-06-13 LAB — PHOSPHORUS: Phosphorus: 6.1 mg/dL — ABNORMAL HIGH (ref 2.5–4.6)

## 2017-06-13 LAB — GLUCOSE, CAPILLARY
GLUCOSE-CAPILLARY: 99 mg/dL (ref 65–99)
GLUCOSE-CAPILLARY: 107 mg/dL — AB (ref 65–99)
GLUCOSE-CAPILLARY: 91 mg/dL (ref 65–99)
Glucose-Capillary: 106 mg/dL — ABNORMAL HIGH (ref 65–99)

## 2017-06-13 LAB — MAGNESIUM: Magnesium: 2 mg/dL (ref 1.7–2.4)

## 2017-06-13 MED ORDER — ORAL CARE MOUTH RINSE
15.0000 mL | Freq: Two times a day (BID) | OROMUCOSAL | Status: DC
  Administered 2017-06-13 – 2017-06-19 (×12): 15 mL via OROMUCOSAL

## 2017-06-13 NOTE — Progress Notes (Signed)
PROGRESS NOTE    John Barr  ZOX:[id number] DOB: Jan 27, 1927 DOA: 06/10/2017 PCP: Mahlon Gammon, MD   Brief Narrative:  John Barr is a 83 y.o. 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 Per patient's daughter, patient was recently diagnosed as ESRD and severe anemia with Hgb 7.9 last week. Pt was given Epogen injection twice. Last dose of Epogen injection was last week. Per pt's daughter, patient has increased weakness in the past several days after the Epogen injection, and became confused since yesterday. Patient moves all extremities, no facial droop or slurred speech. Patient had nausea and vomited once yesterday. He possibly had one episode of loose stool per her daughter. Patient does not have abdominal pain per her daughter, but he has abdominal distention. Admitted to SDU and Mental status slightly improved but then patient started hallucinating. Had Metabolic Acidosis so was changed to Bicarb gtt. Discussed with Nephrology and will need to have [place] with Family to assess if they would even consider Dialysis prior to Nephrology getting involved. Patient started becoming Bradycardic and had periods of Asystole so Cardiology was consulted. Palliative Care Medicine was also consulted for Goals of Care and after extensive discussion with the patient's Daughter, daughter elected to change the patient's Code Status to DNR. Palliative to have family meeting today.   Assessment & Plan:   Principal Problem:   Acute metabolic encephalopathy Active Problems:   Acute renal failure superimposed on stage 5 chronic kidney disease, not on chronic dialysis (HCC)   Brain mass   GERD (gastroesophageal reflux disease)   Hypercholesterolemia   Hypertension   Type II diabetes mellitus with renal  manifestations (HCC)   Hypernatremia   Hyperkalemia   Hypotension   Sepsis (HCC)   Hypothermia   Normocytic anemia   Abdominal distension   Diverticulitis large intestine   Palliative care encounter   DNR (do not resuscitate)   Bradycardia   Pleural effusion  Acute metabolic encephalopathy  -Likely due to multifactorial etiologies, including acute diverticulitis, sepsis, electrolytes disturbance, worsening renal function.  - CT head is negative for acute intracranial abnormalities. - Admitted to stepdown as inpatient. - Treat underlying issues as below; ? If Encephalopathic from uremia - Frequent neurologic checks - Obtained Swallow Screen and SLP recommending NPO - Patient was hallucinating today and seeing things on the ceiling - Palliative Care Involved for [place] meeting and End of Life discussion    Abdominal distention:  -CT scan showed acute diverticulitis at the mid sigmoid colon, with soft tissue inflammation and focal wall thickening. No evidence of perforation or abscess formation at this time. Underlying mass cannot be excluded.  -IV Cipro and Flagyl (patient received one dose of vancomycin and Zosyn for sepsis before getting back the CT scan results). -prn zofran for nausea and vomiting and morphine pain -IVF changed to Bicarb gtt  and will continue at current rate -Sigmoidoscopy could be considered after completion of treatment for diverticulitis since underlying mass cannot be excluded.   Possible UTI:  -pt has positive urinalysis with trace amount of leukocytes and many bacteria -On IV antibiotics as above -Urine Cx showed No Growth   Sepsis and hypotension and hypothermia:  -Patient is septic with hypotension and hypothermia. Blood pressure responded to IV fluid.  -Given 2.5 L normal sitting bolus. -Was Hypothermic this AM again  -IV abx as above -f/u Bx  and Ux; Blood Cx show NGTD at 3 days and Urine Cx showed No Growth -given one dose of Solu Cortef 100 mg  1 -f/u cortisol level -will get Procalcitonin and trend lactic acid levels per sepsis protocol. -IVF: 2.5 L of NS bolus in ED, followed by 75 cc of D5-1/4 NS cc/h due to hypernatremia; Changed IVF to Sodium Bicarbonate 150 mEQ - Had Bear Hugger and warming blanket this AM  AoCKD-V:  -On AdmissionCreatinine 4.01, BUN 84, potassium 5.2, bicarbonate 17, chloride 121. Pt is not on dialysis yet and unlikely a candidate -continue IVF: 2.5 L NS and then D5-1/4 NS due to hypernatremia but changed to Sodium Bicarb As above -BUN/Cr worsened today from 83/4.31 -> 84/4.47 -Will need GOC discussion with family before Nephro sees for possible Dialysis; Patient is unfortunately not a Dialysis Candidate as he is not a Pacemaker Candidate; Code Status changed to DNR and family meeting in AM -Place Foley Catheter for Accurate I's/O's however was never done  Hyperkalemia:  -K=5.2 on and admission and worsened to 6.0 yesterday -pt was given one dose of Kayexalate and 45 g (this is done before knowing that patient had diverticulitis) -Given Albuterol neb and Bicarb drip and improved to 5.1 and now is 5.3 on 06/13/17 -Repeat CMP in AM   Hypernatremia:  -Improved slightly -Due to dehydration. -IV fluid as above including D5-1/4 NS at 75 cc/hr changed to Sodium Bicarb gtt 150 mEQ -f/u by BMP  DM-II with renal complications:  -Last D6L 6.6 on 11/17/16, well controled. Patient is taking NovoLog at home -C/w SSI  HTN:  -Hold Amlodipine and Metoprolol due to hypotension and Braydcardia  HLD: -Hold Zocor until mental status improves  Metabolic Acidosis -Bicarb level was 14 initially and improved from 15 -> 16 -C/w Bicarb gtt  Brain mass:   -Pt has a known suprasellar and sellar mass. Was seen by neurosurgery in [facility]. Patient is not a surgical candidate per neurosurgeon. -Follow up with neurosurgeon  Bradycardia -Pulse has been dropping into the 20's and 40's with episodes of Asystole and  Pauses of 5 seconds -Metoprolol had been held on Admission because of Hypotension -ECHOCardiogram ordered and The estimated ejection fraction was in the range of 55% to 60%. Wall motion was normal; there were no regional wall motion abnormalities. The study is not technically sufficient to allow evaluation of LV diastolic function. -Cardiology Consulted for further evaluation and recommendations -Unfortunately patient is not a good candidate for a permanent Transvenous Pacemaker -Cardiology recommending Discontinuing Telemetry Monitoring   Normocytic anemia:  -Hemoglobin dropped from 8.6 on 11/25/16 -> 7.5, then further to 6.7. No GI bleeding noted. -Anemia panel -check FOBT -Transfused 1 unit of blood and Hb/Hct improved to 7.7/2.43; Hb/Hct now 8.1/24.9 - F/u CBC q6h  GERD: -Pepcid IV  Left Large Pleural Effusion -Seen on ECHO -May not pursue Thoracentesis pending Palliative Care Discussion with Family -Added DuoNebs scheduled today -Palliative to discuss [place] with Patient's Family   DVT prophylaxis: SCDs Code Status: DO NOT RESUSCITATE Family Communication: No family present at bedside  Disposition Plan: Remain in SDU  Consultants:   Cardiology  Palliative Care Medicine  Briefly discussed case with Dr. Justin Mend on 8/16   Procedures:  ECHOCARDIOGRAM Study Conclusions  - Left ventricle: The cavity size was normal. There was mild   concentric hypertrophy. Systolic function was normal. The   estimated ejection fraction was in the range of 55% to 60%. Wall   motion was normal; there were no regional  wall motion   abnormalities. The study is not technically sufficient to allow   evaluation of LV diastolic function. - Aortic valve: Valve mobility was restricted. Transvalvular   velocity was within the normal range. There was no stenosis.   There was no regurgitation. - Mitral valve: Transvalvular velocity was within the normal range.   There was no evidence for stenosis.  There was trivial   regurgitation. - Left atrium: The atrium was mildly dilated. - Right ventricle: The cavity size was normal. Wall thickness was   normal. Systolic function was normal. - Tricuspid valve: There was mild regurgitation. - Pulmonary arteries: Systolic pressure was moderately increased.   PA peak pressure: 53 mm Hg (S). - Pericardium, extracardiac: A trivial pericardial effusion was   identified. There was a large left pleural effusion.   Antimicrobials:  Anti-infectives    Start     Dose/Rate Route Frequency Ordered Stop   06/12/17 2200  vancomycin (VANCOCIN) IVPB 1000 mg/200 mL premix  Status:  Discontinued     1,000 mg 200 mL/hr over 60 Minutes Intravenous Every 48 hours 06/10/17 2132 06/11/17 0254   06/12/17 0400  ciprofloxacin (CIPRO) IVPB 400 mg     400 mg 200 mL/hr over 60 Minutes Intravenous Every 24 hours 06/11/17 1607     06/11/17 0600  piperacillin-tazobactam (ZOSYN) IVPB 3.375 g  Status:  Discontinued     3.375 g 12.5 mL/hr over 240 Minutes Intravenous Every 8 hours 06/10/17 2132 06/11/17 0254   06/11/17 0400  metroNIDAZOLE (FLAGYL) IVPB 500 mg     500 mg 100 mL/hr over 60 Minutes Intravenous Every 8 hours 06/11/17 0254     06/11/17 0400  ciprofloxacin (CIPRO) IVPB 400 mg  Status:  Discontinued     400 mg 200 mL/hr over 60 Minutes Intravenous Every 12 hours 06/11/17 0257 06/11/17 1607   06/10/17 2130  piperacillin-tazobactam (ZOSYN) IVPB 3.375 g     3.375 g 100 mL/hr over 30 Minutes Intravenous  Once 06/10/17 2123 06/10/17 2211   06/10/17 2130  vancomycin (VANCOCIN) IVPB 1000 mg/200 mL premix     1,000 mg 200 mL/hr over 60 Minutes Intravenous  Once 06/10/17 2123 06/10/17 2250     Subjective: Seen and examined at bedside became bradycardic again early this AM. Was hallucinating this AM and was talking about "doors and machines" and was pointing to the ceiling. No family at bedside and no other concerns or complaints. Speech re-evaluated and  recommending to continue NPO.   Objective: Vitals:   06/13/17 0358 06/13/17 0533 06/13/17 0735 06/13/17 1425  BP:  126/88    Pulse:  (!) 38    Resp:  (!) 22    Temp:      TempSrc:      SpO2: 94% 99% 100% 96%  Weight:  97.6 kg (215 lb 2.7 oz)    Height:        Intake/Output Summary (Last 24 hours) at 06/13/17 1915 Last data filed at 06/13/17 0844  Gross per 24 hour  Intake              100 ml  Output              450 ml  Net             -350 ml   Filed Weights   06/10/17 2022 06/13/17 0533  Weight: 81.6 kg (180 lb) 97.6 kg (215 lb 2.7 oz)   Examination: Physical Exam:  Constitutional: Pleasantly confused Elderly AAM in  NAD appears calm Eyes: Sclerae anicteric. Conjunctivae Non-injected ENMT: Hard of Hearing. External ears and nose appear normal Neck: Supple with no visible JVD Respiratory: Diminished breath sounds with some wheezing and crackles especially on the Left. Patient was not tachypenic or using any accessory muscles to breathe but was wearing O2 via [place] Cardiovascular: RRR; S1 S2; Mild LE Edema Abdomen: Soft, NT, Distended due to body habitus. Bowel Sounds Present.  GU: Deferred Musculoskeletal: No contractures; no cyanosis Skin: Cold and dry. No rashes or lesions on a limited skin evaluation. Neurologic: CN 2-12 grossly intact. No appreciable deficits Psychiatric: Impaired judgement and insight. Awake but not alert or oriented. Pleasantly confused   Data Reviewed: I have personally reviewed following labs and imaging studies  CBC:  Recent Labs Lab 06/10/17 2037  06/11/17 1232 06/11/17 1843 06/12/17 0105 06/12/17 0522 06/13/17 0200  WBC 6.8  < > 7.5 8.9 10.1 8.5 6.6  NEUTROABS 4.9  --   --   --   --  6.5 5.2  HGB 7.5*  < > 7.7* 7.7* 8.2* 7.6* 8.1*  HCT 23.6*  < > 24.2* 24.3* 25.9* 23.7* 24.9*  MCV 90.1  < > 89.6 90.7 88.7 88.4 88.3  PLT 181  < > 159 157 170 157 144*  < > = values in this interval not displayed. Basic Metabolic Panel:  Recent  Labs Lab 06/11/17 0101 06/11/17 0830 06/11/17 1232 06/11/17 1602 06/12/17 0522 06/13/17 0200  NA 145 143 146*  --  145 146*  K 5.2* 6.0* 5.1  --  4.9 5.3*  CL 122* 119* 121*  --  117* 119*  CO2 14* 14* 14*  --  15* 16*  GLUCOSE 121* 138* 142* 160* 137* 106*  BUN 77* 79* 81*  --  83* 84*  CREATININE 3.75* 3.95* 4.10*  --  4.31* 4.47*  CALCIUM 8.0* 8.0* 8.1*  --  8.1* 8.3*  MG  --   --   --   --  2.2 2.0  PHOS  --   --   --   --  5.4* 6.1*   GFR: Estimated Creatinine Clearance: 12.7 mL/min (A) (by C-G formula based on SCr of 4.47 mg/dL (H)). Liver Function Tests:  Recent Labs Lab 06/10/17 2037 06/12/17 0522 06/13/17 0200  AST 31 62* 48*  ALT 29 71* 61  ALKPHOS 71 92 84  BILITOT 0.4 0.5 0.5  PROT 6.7 6.1* 6.4*  ALBUMIN 3.2* 3.0* 3.1*   No results for input(s): LIPASE, AMYLASE in the last 168 hours.  Recent Labs Lab 06/11/17 0830  AMMONIA 33   Coagulation Profile:  Recent Labs Lab 06/10/17 2037  INR 1.36   Cardiac Enzymes: No results for input(s): CKTOTAL, CKMB, CKMBINDEX, TROPONINI in the last 168 hours. BNP (last 3 results) No results for input(s): PROBNP in the last 8760 hours. HbA1C: No results for input(s): HGBA1C in the last 72 hours. CBG:  Recent Labs Lab 06/12/17 2117 06/12/17 2356 06/13/17 0628 06/13/17 1137 06/13/17 1654  GLUCAP 102* 101* 106* 99 107*   Lipid Profile: No results for input(s): CHOL, HDL, LDLCALC, TRIG, CHOLHDL, LDLDIRECT in the last 72 hours. Thyroid Function Tests:  Recent Labs  06/10/17 2120  TSH 6.361*   Anemia Panel:  Recent Labs  06/11/17 0000  VITAMINB12 2,234*  FOLATE 22.6  FERRITIN 1,492*  TIBC 185*  IRON 52  RETICCTPCT 4.6*   Sepsis Labs:  Recent Labs Lab 06/10/17 2049 06/10/17 2341 06/11/17 0830  PROCALCITON  --   --  0.19  LATICACIDVEN 1.31 0.80 0.9    Recent Results (from the past 240 hour(s))  Culture, blood (Routine x 2)     Status: None (Preliminary result)   Collection Time:  06/10/17  8:37 PM  Result Value Ref Range Status   Specimen Description BLOOD LEFT FOREARM  Final   Special Requests   Final    BOTTLES DRAWN AEROBIC AND ANAEROBIC Blood Culture adequate volume   Culture NO GROWTH 3 DAYS  Final   Report Status PENDING  Incomplete  Culture, blood (Routine x 2)     Status: None (Preliminary result)   Collection Time: 06/10/17  9:00 PM  Result Value Ref Range Status   Specimen Description BLOOD LEFT ANTECUBITAL  Final   Special Requests   Final    BOTTLES DRAWN AEROBIC AND ANAEROBIC Blood Culture adequate volume   Culture NO GROWTH 3 DAYS  Final   Report Status PENDING  Incomplete  Urine Culture     Status: None   Collection Time: 06/10/17  9:31 PM  Result Value Ref Range Status   Specimen Description URINE, RANDOM  Final   Special Requests NONE  Final   Culture NO GROWTH  Final   Report Status 06/12/2017 FINAL  Final    Radiology Studies: No results found. Scheduled Meds: . insulin aspart  0-5 Units Subcutaneous QHS  . insulin aspart  0-9 Units Subcutaneous TID WC  . ipratropium-albuterol  3 mL Nebulization Q6H  . mouth rinse  15 mL Mouth Rinse BID   Continuous Infusions: . sodium chloride    . ciprofloxacin Stopped (06/13/17 0406)  . famotidine (PEPCID) IV Stopped (06/13/17 1019)  . metronidazole Stopped (06/13/17 1254)    LOS: 3 days   Kerney Elbe, DO [facility] Pager [phone]  If 7PM-7AM, please contact night-coverage [email or web address] Password [facility] 06/13/2017, 7:15 PM

## 2017-06-13 NOTE — Progress Notes (Signed)
  Speech Language Pathology Treatment: Dysphagia  Patient Details Name: John Barr MRN: [id number] DOB: 1927/05/11 Today's Date: 06/13/2017 Time: 2336-1224 SLP Time Calculation (min) (ACUTE ONLY): 14 min  Assessment / Plan / Recommendation Clinical Impression  Patient seen for follow-up for dysphagia to assess PO readiness. HR in mid 70s, RR 15-19 (SpO2 sats unavailable). Pt is alert but disoriented, confused. SLP repositioned to upright, provided skilled observation, differential diagnosis with trials of ice chips. Pt initially with no overt signs of aspiration, however as trials progressed, pt developed wet vocal quality, respirations elevated to 26 and pt with increased work of breathing. Ceased PO trials, reclined pt and RR returned to baseline. Palliative consulting and planning to meet with family this weekend; pt's prognosis is days to weeks given end stage renal failure, encephalopathy and bradycardia. Recommend pt remain NPO at this time; will continue to follow for PO readiness, family education and to update goals of care as appropriate.   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 CXR Bilateral pleural effusions with associated atelectatic changes. CT head no acute changes. Found to have acute metabolic encephalopathy likely due to multifactorial etiologies, including acute diverticulitis, sepsis, electrolytes disturbance, worsening renal function, abdominal distention, sepsis and possible UTI.      SLP Plan  Continue with current plan of care       Recommendations  Diet recommendations: NPO Medication Administration: Via alternative means                Oral Care Recommendations: Oral care QID SLP Visit Diagnosis:  Dysphagia, unspecified (R13.10) Plan: Continue with current plan of care       [place], [place], [place] Speech-Language Pathologist [phone]  Aliene Altes 06/13/2017, 11:19 AM

## 2017-06-13 NOTE — Plan of Care (Signed)
Problem: Education: Goal: Knowledge of disease and its progression will improve Outcome: Not Progressing Pt remains moderately confused

## 2017-06-14 ENCOUNTER — Inpatient Hospital Stay (HOSPITAL_COMMUNITY): Payer: Medicare Other

## 2017-06-14 DIAGNOSIS — E1122 Type 2 diabetes mellitus with diabetic chronic kidney disease: Secondary | ICD-10-CM

## 2017-06-14 LAB — COMPREHENSIVE METABOLIC PANEL
ALT: 54 U/L (ref 17–63)
ANION GAP: 12 (ref 5–15)
AST: 36 U/L (ref 15–41)
Albumin: 3.3 g/dL — ABNORMAL LOW (ref 3.5–5.0)
Alkaline Phosphatase: 81 U/L (ref 38–126)
BUN: 84 mg/dL — ABNORMAL HIGH (ref 6–20)
CHLORIDE: 119 mmol/L — AB (ref 101–111)
CO2: 15 mmol/L — AB (ref 22–32)
CREATININE: 4.66 mg/dL — AB (ref 0.61–1.24)
Calcium: 8.3 mg/dL — ABNORMAL LOW (ref 8.9–10.3)
GFR calc non Af Amer: 10 mL/min — ABNORMAL LOW (ref >60)
GFR, EST AFRICAN AMERICAN: 12 mL/min — AB (ref >60)
Glucose, Bld: 97 mg/dL (ref 65–99)
POTASSIUM: 5.7 mmol/L — AB (ref 3.5–5.1)
SODIUM: 146 mmol/L — AB (ref 135–145)
Total Bilirubin: 0.5 mg/dL (ref 0.3–1.2)
Total Protein: 6.6 g/dL (ref 6.5–8.1)

## 2017-06-14 LAB — GLUCOSE, CAPILLARY
GLUCOSE-CAPILLARY: 104 mg/dL — AB (ref 65–99)
GLUCOSE-CAPILLARY: 90 mg/dL (ref 65–99)
GLUCOSE-CAPILLARY: 87 mg/dL (ref 65–99)
Glucose-Capillary: 89 mg/dL (ref 65–99)

## 2017-06-14 LAB — PHOSPHORUS: Phosphorus: 6.8 mg/dL — ABNORMAL HIGH (ref 2.5–4.6)

## 2017-06-14 LAB — CBC WITH DIFFERENTIAL/PLATELET
Basophils Absolute: 0 10*3/uL (ref 0.0–0.1)
Basophils Relative: 0 %
Eosinophils Absolute: 0.1 10*3/uL (ref 0.0–0.7)
Eosinophils Relative: 1 %
HCT: 26.2 % — ABNORMAL LOW (ref 39.0–52.0)
Hemoglobin: 8.3 g/dL — ABNORMAL LOW (ref 13.0–17.0)
Lymphocytes Relative: 8 %
Lymphs Abs: 0.7 10*3/uL (ref 0.7–4.0)
MCH: 27.9 pg (ref 26.0–34.0)
MCHC: 31.7 g/dL (ref 30.0–36.0)
MCV: 88.2 fL (ref 78.0–100.0)
Monocytes Absolute: 1 10*3/uL (ref 0.1–1.0)
Monocytes Relative: 11 %
Neutro Abs: 6.7 10*3/uL (ref 1.7–7.7)
Neutrophils Relative %: 80 %
Platelets: 171 10*3/uL (ref 150–400)
RBC: 2.97 MIL/uL — ABNORMAL LOW (ref 4.22–5.81)
RDW: 19.4 % — ABNORMAL HIGH (ref 11.5–15.5)
WBC: 8.5 10*3/uL (ref 4.0–10.5)

## 2017-06-14 LAB — GRAM STAIN

## 2017-06-14 LAB — GLUCOSE, PLEURAL OR PERITONEAL FLUID: GLUCOSE FL: 108 mg/dL

## 2017-06-14 LAB — BODY FLUID CELL COUNT WITH DIFFERENTIAL
Eos, Fluid: 0 %
LYMPHS FL: 85 %
Monocyte-Macrophage-Serous Fluid: 9 % — ABNORMAL LOW (ref 50–90)
NEUTROPHIL FLUID: 6 % (ref 0–25)
WBC FLUID: 296 uL (ref 0–1000)

## 2017-06-14 LAB — PROTEIN, PLEURAL OR PERITONEAL FLUID

## 2017-06-14 LAB — ALBUMIN, PLEURAL OR PERITONEAL FLUID: Albumin, Fluid: 1.2 g/dL

## 2017-06-14 LAB — AMYLASE, PLEURAL OR PERITONEAL FLUID: Amylase, Fluid: 43 U/L

## 2017-06-14 LAB — MAGNESIUM: Magnesium: 2 mg/dL (ref 1.7–2.4)

## 2017-06-14 MED ORDER — SODIUM BICARBONATE 8.4 % IV SOLN
INTRAVENOUS | Status: DC
  Administered 2017-06-14 – 2017-06-16 (×4): via INTRAVENOUS
  Filled 2017-06-14 (×9): qty 150

## 2017-06-14 MED ORDER — IPRATROPIUM-ALBUTEROL 0.5-2.5 (3) MG/3ML IN SOLN
3.0000 mL | Freq: Four times a day (QID) | RESPIRATORY_TRACT | Status: DC | PRN
  Administered 2017-06-16: 3 mL via RESPIRATORY_TRACT
  Filled 2017-06-14: qty 3

## 2017-06-14 NOTE — Procedures (Signed)
Ultrasound-guided diagnostic and therapeutic left thoracentesis performed yielding 1 liters of serosanguineous colored fluid. No immediate complications. Follow-up chest x-ray pending.       John Barr E 12:34 PM 06/14/2017

## 2017-06-14 NOTE — Progress Notes (Signed)
PROGRESS NOTE    John Barr  RXV:[id number] DOB: 1927/09/17 DOA: 06/10/2017 PCP: Mahlon Gammon, MD   Brief Narrative:  John Barr is a 83 y.o. 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 Per patient's daughter, patient was recently diagnosed as ESRD and severe anemia with Hgb 7.9 last week. Pt was given Epogen injection twice. Last dose of Epogen injection was last week. Per pt's daughter, patient has increased weakness in the past several days after the Epogen injection, and became confused since yesterday. Patient moves all extremities, no facial droop or slurred speech. Patient had nausea and vomited once yesterday. He possibly had one episode of loose stool per her daughter. Patient does not have abdominal pain per her daughter, but he has abdominal distention. Admitted to SDU and Mental status slightly improved but then patient started hallucinating. Had Metabolic Acidosis so was changed to Bicarb gtt. Discussed with Nephrology and will need to have [place] with Family to assess if they would even consider Dialysis prior to Nephrology getting involved. Patient started becoming Bradycardic and had periods of Asystole so Cardiology was consulted. Palliative Care Medicine was also consulted for Goals of Care and after extensive discussion with the patient's Daughter, daughter elected to change the patient's Code Status to DNR. Palliative Care to meet with Family and daughter sometime this week. Patient had a Left Thoracentesis done today which yielded 1 Liter of Serosanguineous colored fluid.   Assessment & Plan:   Principal Problem:   Acute metabolic encephalopathy Active Problems:   Acute renal failure superimposed on stage 5 chronic kidney disease, not on chronic dialysis (HCC)  Brain mass   GERD (gastroesophageal reflux disease)   Hypercholesterolemia   Hypertension   Type II diabetes mellitus with renal manifestations (HCC)   Hypernatremia   Hyperkalemia   Hypotension   Sepsis (HCC)   Hypothermia   Normocytic anemia   Abdominal distension   Diverticulitis large intestine   Palliative care encounter   DNR (do not resuscitate)   Bradycardia   Pleural effusion  Acute metabolic encephalopathy  -Likely due to multifactorial etiologies, including acute diverticulitis, sepsis, electrolytes disturbance, worsening renal function.  - CT head is negative for acute intracranial abnormalities. - Admitted to stepdown as inpatient. - Treat underlying issues as below; ? If Encephalopathic from uremia - Frequent neurologic checks - Obtained Swallow Screen and SLP recommending NPO - Patient was hallucinating yesterday and seeing things on the ceiling, still remained Confused and was wanting to go home  - Palliative Care Involved for [place] meeting and End of Life discussion    Abdominal distention:  -CT scan showed acute diverticulitis at the mid sigmoid colon, with soft tissue inflammation and focal wall thickening. No evidence of perforation or abscess formation at this time. Underlying mass cannot be excluded.  -IV Cipro and Flagyl (patient received one dose of vancomycin and Zosyn for sepsis before getting back the CT scan results). -prn zofran for nausea and vomiting and morphine pain -IVF changed to Bicarb gtt  and will continue at current rate -Sigmoidoscopy could be considered after completion of treatment for diverticulitis since underlying mass cannot be excluded.   Possible UTI:  -pt has positive urinalysis with trace amount of leukocytes and many bacteria -On IV antibiotics as above -Urine Cx showed No Growth   Sepsis and hypotension and hypothermia:  -Patient is septic with  hypotension and hypothermia. Blood pressure responded to IV fluid.  -Given 2.5  L normal sitting bolus. -Was Hypothermic this AM again  -IV abx as above -f/u Bx and Ux; Blood Cx show NGTD at 4 days and Urine Cx showed No Growth -given one dose of Solu Cortef 100 mg 1 -f/u cortisol level -will get Procalcitonin and trend lactic acid levels per sepsis protocol. -IVF: 2.5 L of NS bolus in ED, followed by 75 cc of D5-1/4 NS cc/h due to hypernatremia; Changed IVF to Sodium Bicarbonate 150 mEQ but was D/C'd yesterday; Restarted Sodium Bicarb at 50 mL/hr - Had [facility] and warming blanket yesterday AM  AoCKD-V:  -On AdmissionCreatinine 4.01, BUN 84, potassium 5.2, bicarbonate 17, chloride 121. Pt is not on dialysis yet and unlikely a candidate -continue IVF: 2.5 L NS and then D5-1/4 NS due to hypernatremia but changed to Sodium Bicarb which was D/C'd yesterday. Restarted Sodium Bicarb in D5W today -BUN/Cr worsened today from 83/4.31 -> 84/4.47 -> 84/4.66 -Will need GOC discussion with family before Nephro sees for possible Dialysis; Patient is unfortunately not a Dialysis Candidate as he is not a Pacemaker Candidate; Code Status changed to DNR and family meeting in AM -Place Foley Catheter for Accurate I's/O's however was never done  Hyperkalemia:  -K=5.2 on and admission and worsened to 6.0 yesterday -pt was given one dose of Kayexalate and 45 g (this is done before knowing that patient had diverticulitis) -Given Albuterol neb and Bicarb drip and improved to 5.1 but is now 5.7 on 06/14/17 -Restarted Bicarb in D5W at 50 mL/hr -Repeat CMP in AM   Hypernatremia:  -Sodium was 146 -Due to dehydration. -IV fluid as above  -F/u by BMP  DM-II with renal complications:  -Last R6E 6.6 on 11/17/16, well controled. Patient is taking NovoLog at home -C/w SSI  HTN:  -Hold Amlodipine and Metoprolol due to hypotension and Braydcardia  HLD: -Hold Zocor until mental status improves  Metabolic Acidosis -Bicarb level was 14 initially and improved from 15 -> 16 ->  15 -C/w Bicarb gtt in D5W  Brain mass:   -Pt has a known suprasellar and sellar mass. Was seen by neurosurgery in [facility]. Patient is not a surgical candidate per neurosurgeon. -Follow up with Neurosurgeon as an outpatient   Bradycardia -Pulse has been dropping into the 20's and 40's with episodes of Asystole and Pauses of 5 seconds -Metoprolol had been held on Admission because of Hypotension -ECHOCardiogram ordered and The estimated ejection fraction was in the range of 55% to 60%. Wall motion was normal; there were no regional wall motion abnormalities. The study is not technically sufficient to allow evaluation of LV diastolic function. -Cardiology Consulted for further evaluation and recommendations -Unfortunately patient is not a good candidate for a permanent Transvenous Pacemaker -Cardiology recommending Discontinuing Telemetry Monitoring   Normocytic anemia:  - Hemoglobin dropped from 8.6 on 11/25/16 -> 7.5, then further to 6.7. No GI bleeding noted. - Anemia panel - Check FOBT - Transfused 1 unit of blood and Hb/Hct improved to 7.7/2.43; Hb/Hct now 8.3/26.2 - Repeat CB in AM  GERD: -C/w Pepcid IV  Left Large Pleural Effusion s/p Thoracentesis -Seen on ECHO -Thoracentesis done today and removed 1 Liter as Palliative still has not met with family  -Follow up CXR showed no pneumothorax or evidence of a complication following left thoracentesis. -C/w DuoNebs scheduled  -Palliative to discuss [place] with Patient's Family   DVT prophylaxis: SCDs Code Status: DO NOT RESUSCITATE Family Communication:  No family present at bedside  Disposition Plan: Remain in SDU  Consultants:   Cardiology  Palliative Care Medicine  Briefly discussed case with Dr. Justin Mend on 8/16   Procedures:  ECHOCARDIOGRAM Study Conclusions  - Left ventricle: The cavity size was normal. There was mild   concentric hypertrophy. Systolic function was normal. The   estimated ejection fraction was  in the range of 55% to 60%. Wall   motion was normal; there were no regional wall motion   abnormalities. The study is not technically sufficient to allow   evaluation of LV diastolic function. - Aortic valve: Valve mobility was restricted. Transvalvular   velocity was within the normal range. There was no stenosis.   There was no regurgitation. - Mitral valve: Transvalvular velocity was within the normal range.   There was no evidence for stenosis. There was trivial   regurgitation. - Left atrium: The atrium was mildly dilated. - Right ventricle: The cavity size was normal. Wall thickness was   normal. Systolic function was normal. - Tricuspid valve: There was mild regurgitation. - Pulmonary arteries: Systolic pressure was moderately increased.   PA peak pressure: 53 mm Hg (S). - Pericardium, extracardiac: A trivial pericardial effusion was   identified. There was a large left pleural effusion.   Antimicrobials:  Anti-infectives    Start     Dose/Rate Route Frequency Ordered Stop   06/12/17 2200  vancomycin (VANCOCIN) IVPB 1000 mg/200 mL premix  Status:  Discontinued     1,000 mg 200 mL/hr over 60 Minutes Intravenous Every 48 hours 06/10/17 2132 06/11/17 0254   06/12/17 0400  ciprofloxacin (CIPRO) IVPB 400 mg     400 mg 200 mL/hr over 60 Minutes Intravenous Every 24 hours 06/11/17 1607     06/11/17 0600  piperacillin-tazobactam (ZOSYN) IVPB 3.375 g  Status:  Discontinued     3.375 g 12.5 mL/hr over 240 Minutes Intravenous Every 8 hours 06/10/17 2132 06/11/17 0254   06/11/17 0400  metroNIDAZOLE (FLAGYL) IVPB 500 mg     500 mg 100 mL/hr over 60 Minutes Intravenous Every 8 hours 06/11/17 0254     06/11/17 0400  ciprofloxacin (CIPRO) IVPB 400 mg  Status:  Discontinued     400 mg 200 mL/hr over 60 Minutes Intravenous Every 12 hours 06/11/17 0257 06/11/17 1607   06/10/17 2130  piperacillin-tazobactam (ZOSYN) IVPB 3.375 g     3.375 g 100 mL/hr over 30 Minutes Intravenous  Once  06/10/17 2123 06/10/17 2211   06/10/17 2130  vancomycin (VANCOCIN) IVPB 1000 mg/200 mL premix     1,000 mg 200 mL/hr over 60 Minutes Intravenous  Once 06/10/17 2123 06/10/17 2250     Subjective: Seen and examined and was still confused and wanted something to do with his house. No nausea or vomiting. No overnight reports noted and no family present at bedside.   Objective: Vitals:   06/14/17 0430 06/14/17 0435 06/14/17 0733 06/14/17 1132  BP: 138/77 (!) 129/94  128/83  Pulse: (!) 38 71    Resp:      Temp:      TempSrc:      SpO2:  95% 96%   Weight:      Height:        Intake/Output Summary (Last 24 hours) at 06/14/17 1908 Last data filed at 06/14/17 1300  Gross per 24 hour  Intake                0 ml  Output  650 ml  Net             -650 ml   Filed Weights   06/10/17 2022 06/13/17 0533  Weight: 81.6 kg (180 lb) 97.6 kg (215 lb 2.7 oz)   Examination: Physical Exam:  Constitutional: Pleasantly demented AAM laying in bed Calm.  Eyes: Sclerae Anicteric. Lids normal ENMT: Hard of Hearing. External ears and nose appear normal.  Neck: Supple with no appreciable JVD.  Respiratory: Diminished with crackles and wheezing worse on Left than Right. Patient was slightly tachypenic. Wearing Supplemental O2 via [place] Cardiovascular: RRR but on the slower side. 2+ Pedal Edema Abdomen: Soft, NT, Distended due to body habitus. Bowel Sounds present  GU: Wearing Condom Cath with very minimal urine in the foley bag Musculoskeletal: No contractures. No cyanosis Skin: Dry with no rashes appreciated. Neurologic: CN 2-12 grossly intact. No appreciable focal deficits Psychiatric: Pleasantly confused and difficult to understand. Impaired judgment and insight.   Data Reviewed: I have personally reviewed following labs and imaging studies  CBC:  Recent Labs Lab 06/10/17 2037  06/11/17 1843 06/12/17 0105 06/12/17 0522 06/13/17 0200 06/14/17 0208  WBC 6.8  < > 8.9 10.1 8.5 6.6  8.5  NEUTROABS 4.9  --   --   --  6.5 5.2 6.7  HGB 7.5*  < > 7.7* 8.2* 7.6* 8.1* 8.3*  HCT 23.6*  < > 24.3* 25.9* 23.7* 24.9* 26.2*  MCV 90.1  < > 90.7 88.7 88.4 88.3 88.2  PLT 181  < > 157 170 157 144* 171  < > = values in this interval not displayed. Basic Metabolic Panel:  Recent Labs Lab 06/11/17 0830 06/11/17 1232 06/11/17 1602 06/12/17 0522 06/13/17 0200 06/14/17 0208  NA 143 146*  --  145 146* 146*  K 6.0* 5.1  --  4.9 5.3* 5.7*  CL 119* 121*  --  117* 119* 119*  CO2 14* 14*  --  15* 16* 15*  GLUCOSE 138* 142* 160* 137* 106* 97  BUN 79* 81*  --  83* 84* 84*  CREATININE 3.95* 4.10*  --  4.31* 4.47* 4.66*  CALCIUM 8.0* 8.1*  --  8.1* 8.3* 8.3*  MG  --   --   --  2.2 2.0 2.0  PHOS  --   --   --  5.4* 6.1* 6.8*   GFR: Estimated Creatinine Clearance: 12.2 mL/min (A) (by C-G formula based on SCr of 4.66 mg/dL (H)). Liver Function Tests:  Recent Labs Lab 06/10/17 2037 06/12/17 0522 06/13/17 0200 06/14/17 0208  AST 31 62* 48* 36  ALT 29 71* 61 54  ALKPHOS 71 92 84 81  BILITOT 0.4 0.5 0.5 0.5  PROT 6.7 6.1* 6.4* 6.6  ALBUMIN 3.2* 3.0* 3.1* 3.3*   No results for input(s): LIPASE, AMYLASE in the last 168 hours.  Recent Labs Lab 06/11/17 0830  AMMONIA 33   Coagulation Profile:  Recent Labs Lab 06/10/17 2037  INR 1.36   Cardiac Enzymes: No results for input(s): CKTOTAL, CKMB, CKMBINDEX, TROPONINI in the last 168 hours. BNP (last 3 results) No results for input(s): PROBNP in the last 8760 hours. HbA1C: No results for input(s): HGBA1C in the last 72 hours. CBG:  Recent Labs Lab 06/13/17 1654 06/13/17 2128 06/14/17 0605 06/14/17 1113 06/14/17 1616  GLUCAP 107* 91 89 104* 90   Lipid Profile: No results for input(s): CHOL, HDL, LDLCALC, TRIG, CHOLHDL, LDLDIRECT in the last 72 hours. Thyroid Function Tests: No results for input(s): TSH, T4TOTAL, FREET4, T3FREE,  THYROIDAB in the last 72 hours. Anemia Panel: No results for input(s): VITAMINB12,  FOLATE, FERRITIN, TIBC, IRON, RETICCTPCT in the last 72 hours. Sepsis Labs:  Recent Labs Lab 06/10/17 2049 06/10/17 2341 06/11/17 0830  PROCALCITON  --   --  0.19  LATICACIDVEN 1.31 0.80 0.9    Recent Results (from the past 240 hour(s))  Culture, blood (Routine x 2)     Status: None (Preliminary result)   Collection Time: 06/10/17  8:37 PM  Result Value Ref Range Status   Specimen Description BLOOD LEFT FOREARM  Final   Special Requests   Final    BOTTLES DRAWN AEROBIC AND ANAEROBIC Blood Culture adequate volume   Culture NO GROWTH 4 DAYS  Final   Report Status PENDING  Incomplete  Culture, blood (Routine x 2)     Status: None (Preliminary result)   Collection Time: 06/10/17  9:00 PM  Result Value Ref Range Status   Specimen Description BLOOD LEFT ANTECUBITAL  Final   Special Requests   Final    BOTTLES DRAWN AEROBIC AND ANAEROBIC Blood Culture adequate volume   Culture NO GROWTH 4 DAYS  Final   Report Status PENDING  Incomplete  Urine Culture     Status: None   Collection Time: 06/10/17  9:31 PM  Result Value Ref Range Status   Specimen Description URINE, RANDOM  Final   Special Requests NONE  Final   Culture NO GROWTH  Final   Report Status 06/12/2017 FINAL  Final  Gram stain     Status: None   Collection Time: 06/14/17 12:25 PM  Result Value Ref Range Status   Specimen Description FLUID LEFT PLEURAL  Final   Special Requests NONE  Final   Gram Stain   Final    FEW WBC PRESENT, PREDOMINANTLY MONONUCLEAR NO ORGANISMS SEEN    Report Status 06/14/2017 FINAL  Final    Radiology Studies: Dg Chest 1 View  Result Date: 06/14/2017 CLINICAL DATA:  S/P thoracentesis. 1L removed from left lung. EXAM: CHEST 1 VIEW COMPARISON:  06/10/2017 FINDINGS: Left pleural effusion has been evacuated.  No left pneumothorax. Small right pleural effusion is similar to the prior exam. The cardiac silhouette is enlarged. IMPRESSION: 1. No pneumothorax or evidence of a complication following  left thoracentesis. Electronically Signed   By: Lajean Manes M.D.   On: 06/14/2017 13:09   US Thoracentesis Asp Pleural Space W/img Guide  Result Date: 06/14/2017 INDICATION: Shortness of breath. Left pleural effusion seen on recent ECHO. Request is made for diagnostic and therapeutic thoracentesis. EXAM: ULTRASOUND GUIDED DIAGNOSTIC AND THERAPEUTIC THORACENTESIS MEDICATIONS: 1% xylocaine COMPLICATIONS: None immediate. PROCEDURE: An ultrasound guided thoracentesis was thoroughly discussed with the patient and questions answered. The benefits, risks, alternatives and complications were also discussed. The patient understands and wishes to proceed with the procedure. Written consent was obtained. Ultrasound was performed to localize and mark an adequate pocket of fluid in the left chest. The area was then prepped and draped in the normal sterile fashion. 1% xylocaine was used for local anesthesia. Under ultrasound guidance a Safe-T-Centesis catheter was introduced. Thoracentesis was performed. The catheter was removed and a dressing applied. FINDINGS: A total of approximately 1 L of serosanguineous fluid was removed. Samples were sent to the laboratory as requested by the clinical team. IMPRESSION: Successful ultrasound guided left thoracentesis yielding 1 L of pleural fluid. Read by: Saverio Danker, PA-C Electronically Signed   By: Corrie Mckusick D.O.   On: 06/14/2017 12:36   Scheduled Meds: .  insulin aspart  0-5 Units Subcutaneous QHS  . insulin aspart  0-9 Units Subcutaneous TID WC  . ipratropium-albuterol  3 mL Nebulization Q6H  . mouth rinse  15 mL Mouth Rinse BID   Continuous Infusions: . sodium chloride    . ciprofloxacin Stopped (06/14/17 0714)  . famotidine (PEPCID) IV Stopped (06/14/17 1002)  . metronidazole Stopped (06/14/17 1253)    LOS: 4 days   Kerney Elbe, DO [facility] Pager [phone]  If 7PM-7AM, please contact night-coverage [email or web address] Password  [email or web address] 06/14/2017, 7:08 PM

## 2017-06-14 NOTE — Progress Notes (Signed)
Pharmacy Antibiotic Note  John Barr is a 83 y.o. male admitted on 06/10/2017 with acute diverticulitis.  Pharmacy has been consulted for Cipro dosing.  D#4 Cipro/Flagyl for possible diverticulitis. Urine dirty. Afebrile, WBC wnl  Plan: Continue cipro 400 mg IV q24h Continue Flagyl 500 mg IV q8h Monitor clinical picture, renal function F/U LOT  Consider empiric 7 day course and stop abx?  Height: 5\' 7"  (170.2 cm) Weight: 215 lb 2.7 oz (97.6 kg) IBW/kg (Calculated) : 66.1  No data recorded.   Recent Labs Lab 06/10/17 2049  06/10/17 2341  06/11/17 0830 06/11/17 1232 06/11/17 1843 06/12/17 0105 06/12/17 0522 06/13/17 0200 06/14/17 0208  WBC  --   --   --   < > 6.3 7.5 8.9 10.1 8.5 6.6 8.5  CREATININE  --   < >  --   < > 3.95* 4.10*  --   --  4.31* 4.47* 4.66*  LATICACIDVEN 1.31  --  0.80  --  0.9  --   --   --   --   --   --   < > = values in this interval not displayed.  Estimated Creatinine Clearance: 12.2 mL/min (A) (by C-G formula based on SCr of 4.66 mg/dL (H)).    No Known Allergies   Thank you for allowing pharmacy to be a part of this patient's care.  Elenor Quinones, PharmD, BCPS Clinical Pharmacist Pager [phone] 06/14/2017 11:15 AM

## 2017-06-14 NOTE — Progress Notes (Signed)
  Speech Language Pathology Treatment: Dysphagia  Patient Details Name: John Barr MRN: [id number] DOB: [birth year] Today's Date: 06/14/2017 Time: 2409-7353 SLP Time Calculation (min) (ACUTE ONLY): 20 min  Assessment / Plan / Recommendation Clinical Impression  Patient seen for dysphagia follow-up. RN present just finished bathing patient; he remains confused but alertness, overall mental status improved today. Ultrasound-guided left thoracentesis performed earlier today with removal of 1 L fluid. Sp02 in upper 90s, HR mid 70s. Performed oral care to maximize safety for PO trials. With single and multiple straw sips of thin liquids, pt with intermittent oral holding but with no overt signs of aspiration, stable vitals and appearance of adequate airway protection. Masticates regular solid however intermittently attempts to converse with RN, SLP despite cues while chewing. Recommend initiating dys 2 diet with thin liquids, meds whole in puree with full supervision, assistance. Monitor vitals closely and feed only when alert. SLP will f/u for tolerance, determine need for instrumental assessment vs advancement.     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 CXR Bilateral pleural effusions with associated atelectatic changes. CT head no acute changes. Found to have acute metabolic encephalopathy likely due to multifactorial etiologies, including acute diverticulitis, sepsis, electrolytes disturbance, worsening renal function, abdominal distention, sepsis and possible UTI.      SLP Plan  Continue with current plan of care       Recommendations  Diet recommendations: Dysphagia 2 (fine chop);Thin liquid Medication Administration: Whole meds with  puree Supervision: Staff to assist with self feeding;Full supervision/cueing for compensatory strategies                Oral Care Recommendations: Oral care BID SLP Visit Diagnosis: Dysphagia, unspecified (R13.10) Plan: Continue with current plan of care       [place], [place], [place] Speech-Language Pathologist [phone]  Aliene Altes 06/14/2017, 2:37 PM

## 2017-06-14 NOTE — Plan of Care (Signed)
Problem: Pain Managment: Goal: General experience of comfort will improve Outcome: Progressing Pt denies pain at this time   

## 2017-06-15 ENCOUNTER — Inpatient Hospital Stay (HOSPITAL_COMMUNITY): Payer: Medicare Other

## 2017-06-15 DIAGNOSIS — R0602 Shortness of breath: Secondary | ICD-10-CM

## 2017-06-15 LAB — COMPREHENSIVE METABOLIC PANEL
ALBUMIN: 3.2 g/dL — AB (ref 3.5–5.0)
ALK PHOS: 78 U/L (ref 38–126)
ALT: 46 U/L (ref 17–63)
ANION GAP: 9 (ref 5–15)
AST: 31 U/L (ref 15–41)
BUN: 84 mg/dL — ABNORMAL HIGH (ref 6–20)
CALCIUM: 8.2 mg/dL — AB (ref 8.9–10.3)
CHLORIDE: 120 mmol/L — AB (ref 101–111)
CO2: 19 mmol/L — AB (ref 22–32)
Creatinine, Ser: 4.78 mg/dL — ABNORMAL HIGH (ref 0.61–1.24)
GFR calc non Af Amer: 10 mL/min — ABNORMAL LOW (ref >60)
GFR, EST AFRICAN AMERICAN: 11 mL/min — AB (ref >60)
GLUCOSE: 109 mg/dL — AB (ref 65–99)
Potassium: 5.6 mmol/L — ABNORMAL HIGH (ref 3.5–5.1)
SODIUM: 148 mmol/L — AB (ref 135–145)
Total Bilirubin: 0.6 mg/dL (ref 0.3–1.2)
Total Protein: 6.2 g/dL — ABNORMAL LOW (ref 6.5–8.1)

## 2017-06-15 LAB — CBC WITH DIFFERENTIAL/PLATELET
BASOS PCT: 0 %
Basophils Absolute: 0 10*3/uL (ref 0.0–0.1)
EOS ABS: 0.1 10*3/uL (ref 0.0–0.7)
Eosinophils Relative: 1 %
HCT: 26.2 % — ABNORMAL LOW (ref 39.0–52.0)
HEMOGLOBIN: 8.4 g/dL — AB (ref 13.0–17.0)
LYMPHS ABS: 0.5 10*3/uL — AB (ref 0.7–4.0)
Lymphocytes Relative: 7 %
MCH: 28.2 pg (ref 26.0–34.0)
MCHC: 32.1 g/dL (ref 30.0–36.0)
MCV: 87.9 fL (ref 78.0–100.0)
MONOS PCT: 9 %
Monocytes Absolute: 0.7 10*3/uL (ref 0.1–1.0)
NEUTROS ABS: 6.4 10*3/uL (ref 1.7–7.7)
NEUTROS PCT: 83 %
PLATELETS: 154 10*3/uL (ref 150–400)
RBC: 2.98 MIL/uL — ABNORMAL LOW (ref 4.22–5.81)
RDW: 19.4 % — ABNORMAL HIGH (ref 11.5–15.5)
WBC: 7.7 10*3/uL (ref 4.0–10.5)

## 2017-06-15 LAB — CULTURE, BLOOD (ROUTINE X 2)
CULTURE: NO GROWTH
Culture: NO GROWTH
Special Requests: ADEQUATE
Special Requests: ADEQUATE

## 2017-06-15 LAB — GLUCOSE, CAPILLARY
GLUCOSE-CAPILLARY: 96 mg/dL (ref 65–99)
GLUCOSE-CAPILLARY: 100 mg/dL — AB (ref 65–99)
Glucose-Capillary: 102 mg/dL — ABNORMAL HIGH (ref 65–99)
Glucose-Capillary: 101 mg/dL — ABNORMAL HIGH (ref 65–99)

## 2017-06-15 LAB — PHOSPHORUS: PHOSPHORUS: 6.9 mg/dL — AB (ref 2.5–4.6)

## 2017-06-15 LAB — MAGNESIUM: Magnesium: 2.2 mg/dL (ref 1.7–2.4)

## 2017-06-15 LAB — PATHOLOGIST SMEAR REVIEW: PATH REVIEW: NEGATIVE

## 2017-06-15 MED ORDER — METRONIDAZOLE 500 MG PO TABS: 500.0000 mg | ORAL_TABLET | Freq: Three times a day (TID) | ORAL | Status: DC

## 2017-06-15 MED ORDER — ACETAMINOPHEN 325 MG PO TABS
650.0000 mg | ORAL_TABLET | Freq: Two times a day (BID) | ORAL | Status: DC
  Administered 2017-06-15 – 2017-06-19 (×6): 650 mg via ORAL
  Filled 2017-06-15 (×6): qty 2

## 2017-06-15 MED ORDER — CIPROFLOXACIN HCL 500 MG PO TABS
500.0000 mg | ORAL_TABLET | ORAL | Status: DC
  Administered 2017-06-16 – 2017-06-19 (×3): 500 mg via ORAL
  Filled 2017-06-15 (×4): qty 1

## 2017-06-15 MED ORDER — METRONIDAZOLE 500 MG PO TABS
500.0000 mg | ORAL_TABLET | Freq: Three times a day (TID) | ORAL | Status: DC
  Administered 2017-06-15 – 2017-06-19 (×11): 500 mg via ORAL
  Filled 2017-06-15 (×12): qty 1

## 2017-06-15 MED ORDER — FAMOTIDINE 20 MG PO TABS
20.0000 mg | ORAL_TABLET | Freq: Every day | ORAL | Status: DC
  Administered 2017-06-16 – 2017-06-18 (×3): 20 mg via ORAL
  Filled 2017-06-15 (×3): qty 1

## 2017-06-15 NOTE — Care Management Important Message (Signed)
Important Message  Patient Details  Name: John Barr MRN: [id number] Date of Birth: Apr 12, 1927   Medicare Important Message Given:  Yes    Nathen May 06/15/2017, 11:34 AM

## 2017-06-15 NOTE — Progress Notes (Signed)
Pt tempeture is still low 93 rectally when pt dosent  Refuse he is alert but confused pt temp has been low for last 3 days Paged X [place]ount no new orders given

## 2017-06-15 NOTE — Progress Notes (Addendum)
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 He is s/p thoracentesis today doing much better than over admission. He is sitting up in the bed. He is pleasant, confused about his location but not combative or agitated.  I discussed his disease tyrajectory and goals of care with his daughter:  1. ESRD-not a HD candidate 2. Cardiac- not a candidate for surgical intervention  3. Did well with thoracentesis- breathing much better  Prior to admission he was living at home with his daughter and wife- his daughter is main caregiver- his wife goes to HD and is not well.  I prepared his daughter for the following: 1. He is now and always going to be high risk for sudden death-age, metabolics, cardiac renal dx. Confirmed DNR, do not rehospitalize. Prognosis is really poor but difficult to say how long- <6 month, much less likely- weeks only if renal function deteriorates.  2. Daughter cannot care for him at home or I would recommend home hospice.  Trial of rehab with palliative care to follow  If he does not rehab we discussed a transition into hospice care- she agrees- he may be approaching EOL soon. She wants to give him best possible chance to maintain his independence but knows this may not be possible. She is asking about [facility].  3. DNR  Overall goal is dignity, QOL and comfort- given the fact that there is really nothing reversible in terms of his chronic disease progression I recommend focusing on symptoms and QOL for the time he has left.  Lane Hacker, DO Palliative Medicine [phone]   Time: 70 minutes Greater than 50%  of this time was spent counseling and coordinating care related to the above assessment and plan.

## 2017-06-15 NOTE — Progress Notes (Addendum)
PROGRESS NOTE    John Barr  MMN:[id number] DOB: October 10, 1927 DOA: 06/10/2017 PCP: Mahlon Gammon, MD   Brief Narrative:  John Barr is a 83 y.o. 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 Per patient's daughter, patient was recently diagnosed as ESRD and severe anemia with Hgb 7.9 last week. Pt was given Epogen injection twice. Last dose of Epogen injection was last week. Per pt's daughter, patient has increased weakness in the past several days after the Epogen injection, and became confused since yesterday. Patient moves all extremities, no facial droop or slurred speech. Patient had nausea and vomited once yesterday. He possibly had one episode of loose stool per her daughter. Patient does not have abdominal pain per her daughter, but he has abdominal distention. Admitted to SDU and Mental status slightly improved but then patient started hallucinating. Had Metabolic Acidosis so was changed to Bicarb gtt. Discussed with Nephrology and will need to have [place] with Family to assess if they would even consider Dialysis prior to Nephrology getting involved.   Patient started becoming Bradycardic and had periods of Asystole so Cardiology was consulted. Palliative Care Medicine was also consulted for Goals of Care and after extensive discussion with the patient's Daughter, daughter elected to change the patient's Code Status to DNR. Patient had a Left Thoracentesis done 8/19 which yielded 1 Liter of Serosanguineous colored fluid. Palliative Care met with Patient's Daughter to discuss goals of care and based on the conversation, would like to try rehab with palliative care and if patient does poorly and does not rehabe transition to Hospice Care. Goal is not to Rehospitalize and is to preserve  quality of life, dignity and comfort. Will obtain PT/OT Consultation for evaluation for SNF and will change IV medications to po now that patient is taking po. PT/OT recommending SNF so Social Worker Consulted to assist with Placement.    Assessment & Plan:   Principal Problem:   Acute metabolic encephalopathy Active Problems:   Acute renal failure superimposed on stage 5 chronic kidney disease, not on chronic dialysis (HCC)   Brain mass   GERD (gastroesophageal reflux disease)   Hypercholesterolemia   Hypertension   Type II diabetes mellitus with renal manifestations (HCC)   Hypernatremia   Hyperkalemia   Hypotension   Sepsis (HCC)   Hypothermia   Normocytic anemia   Abdominal distension   Diverticulitis large intestine   Palliative care encounter   DNR (do not resuscitate)   Bradycardia   Pleural effusion  Acute metabolic encephalopathy, remains confused but pleasantly demented -Likely due to multifactorial etiologies, including acute diverticulitis, sepsis, electrolytes disturbance, worsening renal function.  - CT head is negative for acute intracranial abnormalities. - Admitted to stepdown as inpatient. - Treat underlying issues as below; ? If Encephalopathic from uremia - Frequent neurologic checks - Obtained Swallow Screen and SLP was recommending NPO; Now passed swallow screen for Dysphagia 2 diet.  - Patient was hallucinating yesterday and seeing things on the ceiling, still remained Confused and was wanting to go home  - Palliative Care Involved for [place] meeting and End of Life discussion; Palliative stated patient is at high risk for Suden death and prognosis is poor; Since daughter cannot care for the patient will try rehab with palliative care to follow; If patient does not do well in Rehab recommendation is to transition to Hospice care and  overall goal is to maintain dignity, quality of life, and comfort.    - PT/OT to evaluate and treat and recommending  SNF  Abdominal Distention:  -CT scan showed acute diverticulitis at the mid sigmoid colon, with soft tissue inflammation and focal wall thickening. No evidence of perforation or abscess formation at this time. Underlying mass cannot be excluded.  -IV Cipro and Flagyl (patient received one dose of vancomycin and Zosyn for sepsis before getting back the CT scan results) changed to po now that patient is tolerating po -C/w prn zofran for nausea and vomiting and morphine pain -D/C'd IVF -Sigmoidoscopy could be considered after completion of treatment for diverticulitis since underlying mass cannot be excluded.   Possible UTI:  -pt has positive urinalysis with trace amount of leukocytes and many bacteria -On IV antibiotics as above -Urine Cx showed No Growth   Sepsis and hypotension and hypothermia, improving slowly -Patient is septic with hypotension and hypothermia. Blood pressure responded to IV fluid.  -Given 2.5 L normal sitting bolus. -Was Hypothermic this AM again  -IV abx as above -f/u Bx and Ux; Blood Cx show NGTD at 5 days and Urine Cx showed No Growth -given one dose of Solu Cortef 100 mg 1 -F/u cortisol level -Procalcitonin 0.19 on admission; Lactic Acid went from 1.31 -> 0.80 -> 0.9 -IVF D/Cd - Had [employer] and warming blanket yesterday AM  AoCKD-V:  -On AdmissionCreatinine 4.01, BUN 84, potassium 5.2, bicarbonate 17, chloride 121. Pt is not on dialysis yet and unlikely a candidate -IVF now D/C'd  -BUN/Cr worsened today from 83/4.31 -> 84/4.47 -> 84/4.66 -> 84/4.78 -> 78/4.66 -Will need GOC discussion with family before Nephro sees for possible Dialysis; Patient is unfortunately not a Dialysis Candidate as he is not a Pacemaker Candidate; Code Status changed to DNR and Palliative met with Daughter; Plan is to transition to SNF with Palliative and if patient does not improve go to Hospice -Place Foley Catheter for Accurate I's/O's; Now has Condom  Cath  Hyperkalemia: -K+ now 5.0 on 06/16/17 -D/C'd Bicarb in D5W at 50 mL/hr -Repeat CMP in AM   Hypernatremia, worsening -Sodium was 150 -Due to dehydration. -IV fluid as above now D/C'd -F/u by BMP  DM-II with renal complications:  -Last U7O 6.6 on 11/17/16, well controled.  -Patient is taking NovoLog at home -CBG's ranging from 87-102 -C/w SSI  HTN:  -Hold Amlodipine and Metoprolol due to hypotension and Braydcardia  HLD: -Hold Zocor until mental status improves  Metabolic Acidosis, improved -Bicarb level was 14 initially and improved to 24 -Bicarb gtt in D5W now D/C'd   Brain mass:   -Pt has a known suprasellar and sellar mass. Was seen by neurosurgery in [facility]. Patient is not a surgical candidate per neurosurgeon. -Follow up with Neurosurgeon as an outpatient   Bradycardia -Pulse has been dropping into the 20's and 40's with episodes of Asystole and Pauses of 5 seconds -Metoprolol had been held on Admission because of Hypotension -ECHOCardiogram ordered and The estimated ejection fraction was in the range of 55% to 60%. Wall motion was normal; there were no regional wall motion abnormalities. The study is not technically sufficient to allow evaluation of LV diastolic function. -Cardiology Consulted for further evaluation and recommendations -Unfortunately patient is not a good candidate for a permanent Transvenous Pacemaker -Cardiology recommending Discontinuing Telemetry Monitoring   Normocytic anemia:  - Hemoglobin dropped from 8.6 on 11/25/16 -> 7.5, then further to 6.7. No GI bleeding noted. - Anemia panel -  Check FOBT - Transfused 1 unit of blood and Hb/Hct improved to 7.7/2.43; Hb/Hct now 8.2/25.6 - Repeat CB in AM  GERD: -C/w Pepcid IV  Left Large Pleural Effusion s/p Thoracentesis,  -Seen on ECHO -Thoracentesis done today and removed 1 Liter as Palliative still has not met with family  -Follow up CXR showed no pneumothorax or evidence of a  complication following left thoracentesis. -C/w DuoNebs scheduled  -Palliative to discuss [place] with Patient's Family and patient likely to go to SNF with Palliative Care to follow   DVT prophylaxis: SCDs Code Status: DO NOT RESUSCITATE Family Communication: No family present at bedside  Disposition Plan: Remain in SDU for now; SNF when stable to D/C  Consultants:   Cardiology  Palliative Care Medicine  Briefly discussed case with Dr. Justin Mend on 8/16   Procedures:  ECHOCARDIOGRAM Study Conclusions  - Left ventricle: The cavity size was normal. There was mild   concentric hypertrophy. Systolic function was normal. The   estimated ejection fraction was in the range of 55% to 60%. Wall   motion was normal; there were no regional wall motion   abnormalities. The study is not technically sufficient to allow   evaluation of LV diastolic function. - Aortic valve: Valve mobility was restricted. Transvalvular   velocity was within the normal range. There was no stenosis.   There was no regurgitation. - Mitral valve: Transvalvular velocity was within the normal range.   There was no evidence for stenosis. There was trivial   regurgitation. - Left atrium: The atrium was mildly dilated. - Right ventricle: The cavity size was normal. Wall thickness was   normal. Systolic function was normal. - Tricuspid valve: There was mild regurgitation. - Pulmonary arteries: Systolic pressure was moderately increased.   PA peak pressure: 53 mm Hg (S). - Pericardium, extracardiac: A trivial pericardial effusion was   identified. There was a large left pleural effusion.   Antimicrobials:  Anti-infectives    Start     Dose/Rate Route Frequency Ordered Stop   06/12/17 2200  vancomycin (VANCOCIN) IVPB 1000 mg/200 mL premix  Status:  Discontinued     1,000 mg 200 mL/hr over 60 Minutes Intravenous Every 48 hours 06/10/17 2132 06/11/17 0254   06/12/17 0400  ciprofloxacin (CIPRO) IVPB 400 mg     400  mg 200 mL/hr over 60 Minutes Intravenous Every 24 hours 06/11/17 1607     06/11/17 0600  piperacillin-tazobactam (ZOSYN) IVPB 3.375 g  Status:  Discontinued     3.375 g 12.5 mL/hr over 240 Minutes Intravenous Every 8 hours 06/10/17 2132 06/11/17 0254   06/11/17 0400  metroNIDAZOLE (FLAGYL) IVPB 500 mg     500 mg 100 mL/hr over 60 Minutes Intravenous Every 8 hours 06/11/17 0254     06/11/17 0400  ciprofloxacin (CIPRO) IVPB 400 mg  Status:  Discontinued     400 mg 200 mL/hr over 60 Minutes Intravenous Every 12 hours 06/11/17 0257 06/11/17 1607   06/10/17 2130  piperacillin-tazobactam (ZOSYN) IVPB 3.375 g     3.375 g 100 mL/hr over 30 Minutes Intravenous  Once 06/10/17 2123 06/10/17 2211   06/10/17 2130  vancomycin (VANCOCIN) IVPB 1000 mg/200 mL premix     1,000 mg 200 mL/hr over 60 Minutes Intravenous  Once 06/10/17 2123 06/10/17 2250     Subjective:  Seen and examined and was still confused. Per Nurse did not eat this Afternoon. No overnight events noted.   Objective: Vitals:   06/14/17 1954 06/14/17 2357 06/15/17 5625  06/15/17 0359  BP: 125/81 124/74 118/66   Pulse: 68 68  (!) 43  Resp:      Temp: (!) 96.2 F (35.7 C) (!) 93 F (33.9 C)    TempSrc: Axillary Rectal    SpO2: 96% 97%  100%  Weight:      Height:        Intake/Output Summary (Last 24 hours) at 06/15/17 0751 Last data filed at 06/15/17 0600  Gross per 24 hour  Intake           651.25 ml  Output              300 ml  Net           351.25 ml   Filed Weights   06/10/17 2022 06/13/17 0533  Weight: 81.6 kg (180 lb) 97.6 kg (215 lb 2.7 oz)   Examination: Physical Exam:  Constitutional: Pleasantly confused Elderly AAM in NAD laying in bed; Not as alert as yesterday Eyes: Sclerae anicteric. Conjunctivae Non-injected ENMT: Slightly Hard of Hearing. External Ears and nose appear normal Neck: Supple with no JVD.  Respiratory: Diminished on Left compared to Right with some crackles. Not tachypenic but wearing  supplemental O2 via [place] Cardiovascular: RRR slightly brady. 2/6 Systolic murmur. 2+ pedal edema Abdomen:  Soft, Distended, mildly tender; Bowel Sounds present GU: Condom Cath on with very little urine in foley bag Musculoskeletal: No contractures, no Cyanosis Skin: No rashes or lesions on a limited skin eval; Has 2+ pedal edema Neurologic: CN 2-12 grossly intact. No appreciable focal deficits Psychiatric: Pleasantly confused. Hypophonic and difficult to understand. Impaired judgement and insight  Data Reviewed: I have personally reviewed following labs and imaging studies  CBC:  Recent Labs Lab 06/10/17 2037  06/12/17 0105 06/12/17 0522 06/13/17 0200 06/14/17 0208 06/15/17 0148  WBC 6.8  < > 10.1 8.5 6.6 8.5 7.7  NEUTROABS 4.9  --   --  6.5 5.2 6.7 6.4  HGB 7.5*  < > 8.2* 7.6* 8.1* 8.3* 8.4*  HCT 23.6*  < > 25.9* 23.7* 24.9* 26.2* 26.2*  MCV 90.1  < > 88.7 88.4 88.3 88.2 87.9  PLT 181  < > 170 157 144* 171 154  < > = values in this interval not displayed. Basic Metabolic Panel:  Recent Labs Lab 06/11/17 1232 06/11/17 1602 06/12/17 0522 06/13/17 0200 06/14/17 0208 06/15/17 0148  NA 146*  --  145 146* 146* 148*  K 5.1  --  4.9 5.3* 5.7* 5.6*  CL 121*  --  117* 119* 119* 120*  CO2 14*  --  15* 16* 15* 19*  GLUCOSE 142* 160* 137* 106* 97 109*  BUN 81*  --  83* 84* 84* 84*  CREATININE 4.10*  --  4.31* 4.47* 4.66* 4.78*  CALCIUM 8.1*  --  8.1* 8.3* 8.3* 8.2*  MG  --   --  2.2 2.0 2.0 2.2  PHOS  --   --  5.4* 6.1* 6.8* 6.9*   GFR: Estimated Creatinine Clearance: 11.9 mL/min (A) (by C-G formula based on SCr of 4.78 mg/dL (H)). Liver Function Tests:  Recent Labs Lab 06/10/17 2037 06/12/17 0522 06/13/17 0200 06/14/17 0208 06/15/17 0148  AST 31 62* 48* 36 31  ALT 29 71* 61 54 46  ALKPHOS 71 92 84 81 78  BILITOT 0.4 0.5 0.5 0.5 0.6  PROT 6.7 6.1* 6.4* 6.6 6.2*  ALBUMIN 3.2* 3.0* 3.1* 3.3* 3.2*   No results for input(s): LIPASE, AMYLASE in the last 168  hours.  Recent Labs Lab 06/11/17 0830  AMMONIA 33   Coagulation Profile:  Recent Labs Lab 06/10/17 2037  INR 1.36   Cardiac Enzymes: No results for input(s): CKTOTAL, CKMB, CKMBINDEX, TROPONINI in the last 168 hours. BNP (last 3 results) No results for input(s): PROBNP in the last 8760 hours. HbA1C: No results for input(s): HGBA1C in the last 72 hours. CBG:  Recent Labs Lab 06/14/17 0605 06/14/17 1113 06/14/17 1616 06/14/17 2122 06/15/17 0600  GLUCAP 89 104* 90 87 96   Lipid Profile: No results for input(s): CHOL, HDL, LDLCALC, TRIG, CHOLHDL, LDLDIRECT in the last 72 hours. Thyroid Function Tests: No results for input(s): TSH, T4TOTAL, FREET4, T3FREE, THYROIDAB in the last 72 hours. Anemia Panel: No results for input(s): VITAMINB12, FOLATE, FERRITIN, TIBC, IRON, RETICCTPCT in the last 72 hours. Sepsis Labs:  Recent Labs Lab 06/10/17 2049 06/10/17 2341 06/11/17 0830  PROCALCITON  --   --  0.19  LATICACIDVEN 1.31 0.80 0.9    Recent Results (from the past 240 hour(s))  Culture, blood (Routine x 2)     Status: None (Preliminary result)   Collection Time: 06/10/17  8:37 PM  Result Value Ref Range Status   Specimen Description BLOOD LEFT FOREARM  Final   Special Requests   Final    BOTTLES DRAWN AEROBIC AND ANAEROBIC Blood Culture adequate volume   Culture NO GROWTH 4 DAYS  Final   Report Status PENDING  Incomplete  Culture, blood (Routine x 2)     Status: None (Preliminary result)   Collection Time: 06/10/17  9:00 PM  Result Value Ref Range Status   Specimen Description BLOOD LEFT ANTECUBITAL  Final   Special Requests   Final    BOTTLES DRAWN AEROBIC AND ANAEROBIC Blood Culture adequate volume   Culture NO GROWTH 4 DAYS  Final   Report Status PENDING  Incomplete  Urine Culture     Status: None   Collection Time: 06/10/17  9:31 PM  Result Value Ref Range Status   Specimen Description URINE, RANDOM  Final   Special Requests NONE  Final   Culture NO  GROWTH  Final   Report Status 06/12/2017 FINAL  Final  Gram stain     Status: None   Collection Time: 06/14/17 12:25 PM  Result Value Ref Range Status   Specimen Description FLUID LEFT PLEURAL  Final   Special Requests NONE  Final   Gram Stain   Final    FEW WBC PRESENT, PREDOMINANTLY MONONUCLEAR NO ORGANISMS SEEN    Report Status 06/14/2017 FINAL  Final    Radiology Studies: Dg Chest 1 View  Result Date: 06/14/2017 CLINICAL DATA:  S/P thoracentesis. 1L removed from left lung. EXAM: CHEST 1 VIEW COMPARISON:  06/10/2017 FINDINGS: Left pleural effusion has been evacuated.  No left pneumothorax. Small right pleural effusion is similar to the prior exam. The cardiac silhouette is enlarged. IMPRESSION: 1. No pneumothorax or evidence of a complication following left thoracentesis. Electronically Signed   By: Lajean Manes M.D.   On: 06/14/2017 13:09   Dg Chest Port 1 View  Result Date: 06/15/2017 CLINICAL DATA:  83 year old male with confusion and shortness of breath. Pleural effusions, left side thoracentesis yesterday. EXAM: PORTABLE CHEST 1 VIEW COMPARISON:  06/14/2017 and earlier FINDINGS: Portable AP semi upright view at 0655 hours. Basilar in veiling opacity in the right lung has not significantly changed since 06/10/2017. Left lung base ventilation remains improved following thoracentesis. No pneumothorax identified. Stable cardiac size and mediastinal contours. No  areas of worsening ventilation. IMPRESSION: 1. Persistent right pleural effusion with lung base atelectasis or consolidation. 2. Left lung base ventilation remains improved following thoracentesis yesterday. 3. No new cardiopulmonary abnormality. Electronically Signed   By: Genevie Ann M.D.   On: 06/15/2017 07:33   US Thoracentesis Asp Pleural Space W/img Guide  Result Date: 06/14/2017 INDICATION: Shortness of breath. Left pleural effusion seen on recent ECHO. Request is made for diagnostic and therapeutic thoracentesis. EXAM:  ULTRASOUND GUIDED DIAGNOSTIC AND THERAPEUTIC THORACENTESIS MEDICATIONS: 1% xylocaine COMPLICATIONS: None immediate. PROCEDURE: An ultrasound guided thoracentesis was thoroughly discussed with the patient and questions answered. The benefits, risks, alternatives and complications were also discussed. The patient understands and wishes to proceed with the procedure. Written consent was obtained. Ultrasound was performed to localize and mark an adequate pocket of fluid in the left chest. The area was then prepped and draped in the normal sterile fashion. 1% xylocaine was used for local anesthesia. Under ultrasound guidance a Safe-T-Centesis catheter was introduced. Thoracentesis was performed. The catheter was removed and a dressing applied. FINDINGS: A total of approximately 1 L of serosanguineous fluid was removed. Samples were sent to the laboratory as requested by the clinical team. IMPRESSION: Successful ultrasound guided left thoracentesis yielding 1 L of pleural fluid. Read by: Saverio Danker, PA-C Electronically Signed   By: Corrie Mckusick D.O.   On: 06/14/2017 12:36   Scheduled Meds: . insulin aspart  0-5 Units Subcutaneous QHS  . insulin aspart  0-9 Units Subcutaneous TID WC  . mouth rinse  15 mL Mouth Rinse BID   Continuous Infusions: . sodium chloride    . ciprofloxacin Stopped (06/15/17 0406)  . famotidine (PEPCID) IV Stopped (06/14/17 1002)  . metronidazole Stopped (06/15/17 0600)  .  sodium bicarbonate  infusion 1000 mL 75 mL/hr at 06/15/17 0600    LOS: 5 days   Kerney Elbe, DO [facility] Pager [phone]  If 7PM-7AM, please contact night-coverage [email or web address] Password [email or web address] 06/15/2017, 7:51 AM

## 2017-06-15 NOTE — Progress Notes (Signed)
  Speech Language Pathology Treatment: Dysphagia  Patient Details Name: John Barr MRN: [id number] DOB: 22-Dec-1926 Today's Date: 06/15/2017 Time: 0810-0823 SLP Time Calculation (min) (ACUTE ONLY): 13 min  Assessment / Plan / Recommendation Clinical Impression  Pt confused, adamant he is not in the hospital. Agreeable to several bites and one sip liquid only not revealing deficits. Pt is now Palliative care; rectal temp is 93. Recommend he continue modified diet texture and thin liquids, full supervision. ST will sign off on pt's care at present.    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 CXR Bilateral pleural effusions with associated atelectatic changes. CT head no acute changes. Found to have acute metabolic encephalopathy likely due to multifactorial etiologies, including acute diverticulitis, sepsis, electrolytes disturbance, worsening renal function, abdominal distention, sepsis and possible UTI.      SLP Plan  All goals met;Discharge SLP treatment due to (comment)       Recommendations  Diet recommendations: Dysphagia 2 (fine chop);Thin liquid Liquids provided via: Cup;Straw Medication Administration: Whole meds with puree Supervision: Staff to assist with self feeding;Full supervision/cueing for compensatory strategies Compensations: Minimize environmental distractions;Small sips/bites;Slow rate Postural Changes and/or Swallow Maneuvers: Seated upright 90 degrees                Oral Care Recommendations: Oral care BID Follow up Recommendations: Skilled Nursing facility SLP Visit Diagnosis: Dysphagia, unspecified (R13.10) Plan: All goals met;Discharge SLP treatment due to (comment)       GO                 Houston Siren 06/15/2017, 8:26 AM  Orbie Pyo Colvin Caroli.Ed [employer] [phone]

## 2017-06-16 LAB — PHOSPHORUS: PHOSPHORUS: 5.8 mg/dL — AB (ref 2.5–4.6)

## 2017-06-16 LAB — CBC WITH DIFFERENTIAL/PLATELET
BASOS PCT: 0 %
Basophils Absolute: 0 10*3/uL (ref 0.0–0.1)
EOS ABS: 0.2 10*3/uL (ref 0.0–0.7)
EOS PCT: 3 %
HCT: 25.6 % — ABNORMAL LOW (ref 39.0–52.0)
HEMOGLOBIN: 8.2 g/dL — AB (ref 13.0–17.0)
LYMPHS ABS: 1 10*3/uL (ref 0.7–4.0)
Lymphocytes Relative: 14 %
MCH: 28 pg (ref 26.0–34.0)
MCHC: 32 g/dL (ref 30.0–36.0)
MCV: 87.4 fL (ref 78.0–100.0)
Monocytes Absolute: 0.9 10*3/uL (ref 0.1–1.0)
Monocytes Relative: 12 %
NEUTROS PCT: 71 %
Neutro Abs: 5.1 10*3/uL (ref 1.7–7.7)
PLATELETS: 149 10*3/uL — AB (ref 150–400)
RBC: 2.93 MIL/uL — AB (ref 4.22–5.81)
RDW: 19 % — ABNORMAL HIGH (ref 11.5–15.5)
WBC: 7.2 10*3/uL (ref 4.0–10.5)

## 2017-06-16 LAB — MAGNESIUM: Magnesium: 2.2 mg/dL (ref 1.7–2.4)

## 2017-06-16 LAB — GLUCOSE, CAPILLARY
GLUCOSE-CAPILLARY: 86 mg/dL (ref 65–99)
GLUCOSE-CAPILLARY: 96 mg/dL (ref 65–99)
Glucose-Capillary: 101 mg/dL — ABNORMAL HIGH (ref 65–99)
Glucose-Capillary: 85 mg/dL (ref 65–99)

## 2017-06-16 LAB — COMPREHENSIVE METABOLIC PANEL
ALBUMIN: 3.3 g/dL — AB (ref 3.5–5.0)
ALK PHOS: 78 U/L (ref 38–126)
ALT: 44 U/L (ref 17–63)
ANION GAP: 7 (ref 5–15)
AST: 51 U/L — ABNORMAL HIGH (ref 15–41)
BUN: 78 mg/dL — ABNORMAL HIGH (ref 6–20)
CHLORIDE: 119 mmol/L — AB (ref 101–111)
CO2: 24 mmol/L (ref 22–32)
Calcium: 8.2 mg/dL — ABNORMAL LOW (ref 8.9–10.3)
Creatinine, Ser: 4.66 mg/dL — ABNORMAL HIGH (ref 0.61–1.24)
GFR calc non Af Amer: 10 mL/min — ABNORMAL LOW (ref >60)
GFR, EST AFRICAN AMERICAN: 12 mL/min — AB (ref >60)
GLUCOSE: 100 mg/dL — AB (ref 65–99)
Potassium: 5 mmol/L (ref 3.5–5.1)
SODIUM: 150 mmol/L — AB (ref 135–145)
Total Bilirubin: 0.3 mg/dL (ref 0.3–1.2)
Total Protein: 6.1 g/dL — ABNORMAL LOW (ref 6.5–8.1)

## 2017-06-16 LAB — PH, BODY FLUID: PH, BODY FLUID: 7.5

## 2017-06-16 NOTE — Evaluation (Signed)
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   Clinical Impression   Pt admitted with above. He demonstrates the below listed deficits.  Pt was unable to engage in OT/PT session requiring total A for all activity and max A +2 - total A+2 for bed mobility.  He is total A for all self care activities.  He roused briefly with EOB sitting and yelled several times for [name] to leave him alone, then fell asleep while sitting EOB.  He likely will not make any functional improvements with OT given medical issues and level of current confusion and lethargy, however, will place him on caseload for a trial of OT to determine his rehab potential.       Follow Up Recommendations  SNF    Equipment Recommendations  None recommended by OT    Recommendations for Other Services       Precautions / Restrictions Precautions Precautions: Fall Restrictions Weight Bearing Restrictions: No      Mobility Bed Mobility Overal bed mobility: Needs Assistance Bed Mobility: Supine to Sit;Rolling;Sit to Supine Rolling: Max assist;+2 for physical assistance   Supine to sit: Max assist;+2 for physical assistance Sit to supine: Max assist;+2 for physical assistance   General bed mobility comments: maxAx2 for trunk to upright and LE to floor, unable to hold upright excessive R lateral lean   Transfers Overall transfer level: Needs assistance               General transfer comment: total assist x3     Balance Overall balance assessment: Needs  assistance Sitting-balance support: Feet supported Sitting balance-Leahy Scale: Zero                                     ADL either performed or assessed with clinical judgement   ADL Overall ADL's : Needs assistance/impaired Eating/Feeding: Total assistance   Grooming: Wash/dry hands;Wash/dry face;Oral care;Brushing hair;Total assistance;Sitting;Bed level   Upper Body Bathing: Total assistance;Bed level   Lower Body Bathing: Total assistance;Bed level   Upper Body Dressing : Total assistance;Bed level   Lower Body Dressing: Total assistance;Bed level   Toilet Transfer: Total assistance Toilet Transfer Details (indicate cue type and reason): unable to safely attempt  Toileting- Clothing Manipulation and Hygiene: Total assistance;Bed level       Functional mobility during ADLs: Total assistance;+2 for physical assistance General ADL Comments: Pt unable to engage in ADL activities      Vision         Perception     Praxis      Pertinent Vitals/Pain Pain Assessment: Faces Faces Pain Scale: Hurts little more Pain Location: undetermined but grimaces with movement  Pain Descriptors / Indicators: Grimacing Pain Intervention(s): Monitored during session     Hand Dominance Right   Extremity/Trunk Assessment Upper Extremity Assessment Upper Extremity Assessment: Generalized weakness;Difficult to assess due to impaired cognition   Lower Extremity Assessment Lower Extremity Assessment: Defer to PT evaluation       Communication Communication Communication: Expressive difficulties;HOH;Receptive difficulties   Cognition Arousal/Alertness: Lethargic Behavior During Therapy: Flat affect  Overall Cognitive Status: No family/caregiver present to determine baseline cognitive functioning                                 General Comments: Pt does not follow commands.  Does not engage in conversation, nor activity.  He frequently yelled "[name]  get out of here".      General Comments  Pt initially asleep in supine, he roused minimally with EOB sitting, but fell back to sleep while sitting     Exercises     Shoulder Instructions      Home Living Family/patient expects to be discharged to:: Skilled nursing facility                                        Prior Functioning/Environment Level of Independence: Needs assistance    ADL's / Homemaking Assistance Needed: needs assist with all ADLs   Comments: daughter not present for accurate history        OT Problem List: Decreased strength;Impaired balance (sitting and/or standing);Decreased activity tolerance;Decreased cognition;Decreased coordination;Decreased safety awareness;Decreased knowledge of use of DME or AE      OT Treatment/Interventions: Self-care/ADL training;Therapeutic exercise;Therapeutic activities;Patient/family education;Balance training    OT Goals(Current goals can be found in the care plan section) Acute Rehab OT Goals OT Goal Formulation: Patient unable to participate in goal setting Time For Goal Achievement: 06/30/17 Potential to Achieve Goals: Poor ADL Goals Additional ADL Goal #1: Pt will follow one step commands 25% of time during ADL activity  Additional ADL Goal #2: Pt will demonstrates sustained attention x 10 seconds during ADL activity   OT Frequency: Min 1X/week   Barriers to D/C: Decreased caregiver support          Co-evaluation PT/OT/SLP Co-Evaluation/Treatment: Yes Reason for Co-Treatment: Complexity of the patient's impairments (multi-system involvement);For patient/therapist safety PT goals addressed during session: Mobility/safety with mobility OT goals addressed during session: Strengthening/ROM      AM-PAC PT "6 Clicks" Daily Activity     Outcome Measure Help from another person eating meals?: Total Help from another person taking care of personal grooming?: Total Help from another person toileting,  which includes using toliet, bedpan, or urinal?: Total Help from another person bathing (including washing, rinsing, drying)?: Total Help from another person to put on and taking off regular upper body clothing?: Total Help from another person to put on and taking off regular lower body clothing?: Total 6 Click Score: 6   End of Session Equipment Utilized During Treatment: Oxygen Nurse Communication: Mobility status  Activity Tolerance: Patient limited by lethargy Patient left: in bed;with call bell/phone within reach  OT Visit Diagnosis: Muscle weakness (generalized) (M62.81);Cognitive communication deficit (R41.841)                Time: 6761-9509 OT Time Calculation (min): 39 min Charges:  OT General Charges $OT Visit: 1 Procedure OT Evaluation $OT Eval Moderate Complexity: 1 Procedure G-Codes:     Lucille Passy, OTR/L [phone]   Lucille Passy M 06/16/2017, 4:51 PM

## 2017-06-16 NOTE — Progress Notes (Signed)
Patient is a 1:1 feeder; pursing lips will not eat at this time. Will try again later. Tray at bedside. Took medications crushed in pudding

## 2017-06-16 NOTE — Evaluation (Signed)
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  Clinical Impression  Patient evaluated by Physical Therapy at this time pt is unable to participate in therapy due to lethargy. Pt will benefit from care in a SNF at discharge to provide good ongoing QoL. PT is signing off.     Follow Up Recommendations SNF;Supervision/Assistance - 24 hour    Equipment Recommendations  None recommended by PT    Recommendations for Other Services       Precautions / Restrictions Precautions Precautions: Fall Restrictions Weight Bearing Restrictions: No      Mobility  Bed Mobility Overal bed mobility: Needs Assistance Bed Mobility: Supine to Sit;Rolling;Sit to Supine Rolling: Max assist;+2 for physical assistance   Supine to sit: Max assist;+2 for physical assistance Sit to supine: Max assist;+2 for physical assistance   General bed mobility comments: maxAx2 for trunk to upright and LE to floor, unable to hold upright excessive R lateral lean   Transfers Overall transfer level: Needs assistance               General transfer comment: total assist x3       Balance Overall balance assessment: Needs assistance Sitting-balance support: Feet supported Sitting balance-Leahy Scale: Zero                                       Pertinent Vitals/Pain Pain Assessment: Faces Faces Pain Scale: Hurts little more Pain Location: undetermined but grimaces with movement  Pain Descriptors / Indicators: Grimacing Pain Intervention(s): Monitored during  session    Home Living Family/patient expects to be discharged to:: Skilled nursing facility                      Prior Function Level of Independence: Needs assistance      ADL's / Homemaking Assistance Needed: needs assist with all ADLs  Comments: daughter not present for accurate history     Hand Dominance        Extremity/Trunk Assessment   Upper Extremity Assessment Upper Extremity Assessment: Defer to OT evaluation    Lower Extremity Assessment Lower Extremity Assessment: Generalized weakness;Difficult to assess due to impaired cognition       Communication   Communication: Expressive difficulties;HOH;Receptive difficulties  Cognition Arousal/Alertness: Lethargic Behavior During Therapy: Flat affect Overall Cognitive Status: No family/caregiver present to determine baseline cognitive functioning                                        General Comments General comments (skin integrity, edema, etc.): Pt lethargic, falling asleep while in sitting EoB.        Assessment/Plan    PT Assessment Patent does not need any further PT services                   Co-evaluation PT/OT/SLP Co-Evaluation/Treatment: Yes Reason for Co-Treatment: For patient/therapist safety;Necessary to address cognition/behavior during functional activity PT goals addressed during session: Mobility/safety with mobility  AM-PAC PT "6 Clicks" Daily Activity  Outcome Measure Difficulty turning over in bed (including adjusting bedclothes, sheets and blankets)?: Unable Difficulty moving from lying on back to sitting on the side of the bed? : Unable Difficulty sitting down on and standing up from a chair with arms (e.g., wheelchair, bedside commode, etc,.)?: Unable Help needed moving to and from a bed to chair (including a wheelchair)?: Total Help needed walking in hospital room?: Total Help needed climbing 3-5 steps with a railing? : Total 6 Click  Score: 6    End of Session   Activity Tolerance: Patient limited by lethargy Patient left: in bed;with call bell/phone within reach;with bed alarm set Nurse Communication: Mobility status PT Visit Diagnosis: Other abnormalities of gait and mobility (R26.89);Muscle weakness (generalized) (M62.81)    Time: 1443-1540 PT Time Calculation (min) (ACUTE ONLY): 38 min   Charges:   PT Evaluation $PT Eval Moderate Complexity: 1 Mod PT Treatments $Therapeutic Activity: 8-22 mins   PT G Codes:        Houa Ackert B. Migdalia Dk PT, DPT Acute Rehabilitation  [phone] Pager [phone]    [place] 06/16/2017, 2:22 PM

## 2017-06-17 DIAGNOSIS — K5732 Diverticulitis of large intestine without perforation or abscess without bleeding: Secondary | ICD-10-CM

## 2017-06-17 LAB — COMPREHENSIVE METABOLIC PANEL
ALT: 42 U/L (ref 17–63)
AST: 49 U/L — AB (ref 15–41)
Albumin: 3.2 g/dL — ABNORMAL LOW (ref 3.5–5.0)
Alkaline Phosphatase: 76 U/L (ref 38–126)
Anion gap: 8 (ref 5–15)
BILIRUBIN TOTAL: 0.4 mg/dL (ref 0.3–1.2)
BUN: 71 mg/dL — AB (ref 6–20)
CALCIUM: 8.2 mg/dL — AB (ref 8.9–10.3)
CO2: 27 mmol/L (ref 22–32)
CREATININE: 4.56 mg/dL — AB (ref 0.61–1.24)
Chloride: 116 mmol/L — ABNORMAL HIGH (ref 101–111)
GFR, EST AFRICAN AMERICAN: 12 mL/min — AB (ref >60)
GFR, EST NON AFRICAN AMERICAN: 10 mL/min — AB (ref >60)
Glucose, Bld: 87 mg/dL (ref 65–99)
Potassium: 4.9 mmol/L (ref 3.5–5.1)
Sodium: 151 mmol/L — ABNORMAL HIGH (ref 135–145)
TOTAL PROTEIN: 6 g/dL — AB (ref 6.5–8.1)

## 2017-06-17 LAB — CBC WITH DIFFERENTIAL/PLATELET
BASOS ABS: 0 10*3/uL (ref 0.0–0.1)
Basophils Relative: 0 %
EOS PCT: 3 %
Eosinophils Absolute: 0.2 10*3/uL (ref 0.0–0.7)
HEMATOCRIT: 25.3 % — AB (ref 39.0–52.0)
Hemoglobin: 8.1 g/dL — ABNORMAL LOW (ref 13.0–17.0)
LYMPHS ABS: 1.2 10*3/uL (ref 0.7–4.0)
LYMPHS PCT: 15 %
MCH: 28.2 pg (ref 26.0–34.0)
MCHC: 32 g/dL (ref 30.0–36.0)
MCV: 88.2 fL (ref 78.0–100.0)
Monocytes Absolute: 0.9 10*3/uL (ref 0.1–1.0)
Monocytes Relative: 12 %
NEUTROS ABS: 5.4 10*3/uL (ref 1.7–7.7)
Neutrophils Relative %: 70 %
PLATELETS: 149 10*3/uL — AB (ref 150–400)
RBC: 2.87 MIL/uL — AB (ref 4.22–5.81)
RDW: 19.1 % — ABNORMAL HIGH (ref 11.5–15.5)
WBC: 7.7 10*3/uL (ref 4.0–10.5)

## 2017-06-17 LAB — MAGNESIUM: MAGNESIUM: 2 mg/dL (ref 1.7–2.4)

## 2017-06-17 LAB — GLUCOSE, CAPILLARY
GLUCOSE-CAPILLARY: 125 mg/dL — AB (ref 65–99)
Glucose-Capillary: 85 mg/dL (ref 65–99)
Glucose-Capillary: 102 mg/dL — ABNORMAL HIGH (ref 65–99)
Glucose-Capillary: 126 mg/dL — ABNORMAL HIGH (ref 65–99)

## 2017-06-17 LAB — PHOSPHORUS: PHOSPHORUS: 5.4 mg/dL — AB (ref 2.5–4.6)

## 2017-06-17 NOTE — Progress Notes (Signed)
Administered patient medications crushed mixed with vanilla ice cream(120 ml); ate all of ice cream, drink 1/2 cup of orange juice (60 ml);  Ate 2 bites of cream potatoes, tend to pocket food, have to be careful. Sitting upright in bed while eating.

## 2017-06-17 NOTE — Progress Notes (Signed)
ANTIBIOTIC CONSULT NOTE - f/u  Pharmacy Consult for Cipro Indication: Diverticulitis  No Known Allergies  Patient Measurements: Height: 5\' 7"  (170.2 cm) Weight: 212 lb 15.4 oz (96.6 kg) IBW/kg (Calculated) : 66.1 Adjusted Body Weight:    Vital Signs: Temp: 94.6 F (34.8 C) (08/22 1157) Temp Source: Axillary (08/22 1157) BP: 127/74 (08/22 1157) Pulse Rate: 78 (08/22 1157) Intake/Output from previous day: 08/21 0701 - 08/22 0700 In: 825 [I.V.:825] Out: 750 [Urine:750] Intake/Output from this shift: Total I/O In: 120 [P.O.:120] Out: 800 [Urine:800]  Labs:  Recent Labs  06/15/17 0148 06/16/17 0828 06/17/17 0325  WBC 7.7 7.2 7.7  HGB 8.4* 8.2* 8.1*  PLT 154 149* 149*  CREATININE 4.78* 4.66* 4.56*   Estimated Creatinine Clearance: 12.4 mL/min (A) (by C-G formula based on SCr of 4.56 mg/dL (H)). No results for input(s): VANCOTROUGH, VANCOPEAK, VANCORANDOM, GENTTROUGH, GENTPEAK, GENTRANDOM, TOBRATROUGH, TOBRAPEAK, TOBRARND, AMIKACINPEAK, AMIKACINTROU, AMIKACIN in the last 72 hours.   Microbiology:   Medical History: Past Medical History:  Diagnosis Date  . Arthritis   . Diabetes mellitus without complication ([place])   . Exposure to TB    hx  . GERD (gastroesophageal reflux disease)   . High frequency hearing loss   . Hypercholesterolemia   . Hypertension     Assessment: ID: [id number] Cipro/Flagyl for possible diverticulitis. Urine dirty. Temps all low (93's up to 96.2), WBC wnl. Possibly UTI. L pleural fluid cultures pending. Scr 4.56 stable. - CT scan showed acute diverticulitis at the mid sigmoid colon, with soft tissue inflammation and focal wall thickening. No evidence of perforation or abscess formation at this time.  Vanc x 1 on 8/15 Zosyn x 1 on 8/15 Cipro 8/16 >> Flagyl IV 8/16 >>  8/15 blood cx x 2 - Negative 8/15 urine - Negative 8/19: L pleural fluid>>  Goal of Therapy:  Eradication of infection  Plan:  Continue cipro 400 mg IV q24h Continue  Flagyl 500 mg IV q8h F/U LOT- Consider empiric 7 day course and stop abx?   Trig Mcbryar S. Alford Highland, PharmD, BCPS Clinical Staff Pharmacist Pager [phone]  Eilene Ghazi Stillinger 06/17/2017,1:35 PM

## 2017-06-17 NOTE — Clinical Social Work Note (Signed)
Clinical Social Work Assessment  Patient Details  Name: John Barr MRN: [id number] Date of Birth: 1927-01-03  Date of referral:  06/17/17               Reason for consult:  Discharge Planning, Facility Placement                Permission sought to share information with:  Family Supports Permission granted to share information::  Yes, Verbal Permission Granted  Name::     Counsellor::  snf  Relationship::  daughter  Contact Information:  [phone]  Housing/Transportation Living arrangements for the past 2 months:  [place] of Information:  Adult Children Patient Interpreter Needed:  None Criminal Activity/Legal Involvement Pertinent to Current Situation/Hospitalization:  No - Comment as needed Significant Relationships:  Adult Children, Spouse Lives with:  Spouse Do you feel safe going back to the place where you live?  No Need for family participation in patient care:  Yes (Comment)  Care giving concerns:  Patient unable to participate due to only being cognitive of self. Patient has support from spouse and adult daughter  Facilities manager / plan: Clinical Social Worker spoke to patients daughter (John Barr) via phone. John Barr stated she had spoken to [name] (palliative MD) and she is aware of patients disposition plan. John Barr stated she is in agreement with patient discharge to SNF with palliative to follow but would prefer [facility] since patient has been their in the past.    Employment status:  Retired Nurse, adult PT Recommendations:  [place] (with palliative to follow) Information / Referral to community resources:  [place] (with palliative to follow)  Patient/Family's Response to care: Family appreciates CSW role in patient care  Patient/Family's Understanding of and Emotional Response to Diagnosis, Current Treatment, and Prognosis: Family has good understanding  of current medical state and patients limitations. Family is supportive and is in agreement with patient DC to SNF with palliative    Emotional Assessment Appearance:  Appears stated age Attitude/Demeanor/Rapport:  Unable to Assess Affect (typically observed):  Unable to Assess Orientation:  Oriented to Self Alcohol / Substance use:  Not Applicable Psych involvement (Current and /or in the community):  No (Comment)  Discharge Needs  Concerns to be addressed:  No discharge needs identified Readmission within the last 30 days:  No Current discharge risk:  Terminally ill Barriers to Discharge:  No Barriers Identified   Wende Neighbors, LCSW 06/17/2017, 11:02 AM

## 2017-06-17 NOTE — Progress Notes (Signed)
Unable to get oral temp on pt this evening, refuses to open mouth. Refuses to allow RN and tech to get axillary temp as well. Previous temps in day day noted, orders for bear hugger if hypothermic. Will attempt at later time. Will continue to monitor.

## 2017-06-17 NOTE — NC FL2 (Signed)
[place] LEVEL OF CARE SCREENING TOOL     IDENTIFICATION  Patient Name: John Barr Birthdate: 1926/11/26 Sex: male Admission Date (Current Location): 06/10/2017  [facility] and [place] Number:  Herbalist and Address:  [facility] [place], [place] [address] [place], [place] [address]      Provider Number: [id number]  Attending Physician Name and Address:  Ladene Artist., MD  Relative Name and Phone Number:       Current Level of Care: Hospital Recommended Level of Care: [place] (with palliative) Prior Approval Number:    Date Approved/Denied:   PASRR Number: [id number]  Discharge Plan: SNF (with palliative)    Current Diagnoses: Patient Active Problem List   Diagnosis Date Noted  . Bradycardia 06/12/2017  . Pleural effusion 06/12/2017  . Palliative care encounter   . DNR (do not resuscitate)   . Diverticulitis large intestine 06/11/2017  . Hypernatremia 06/10/2017  . Hyperkalemia 06/10/2017  . Hypotension 06/10/2017  . Sepsis ([place]) 06/10/2017  . Hypothermia 06/10/2017  . Normocytic anemia 06/10/2017  . Abdominal distension 06/10/2017  . Acute metabolic encephalopathy 88/41/6606  . GERD (gastroesophageal reflux disease)   . Hypercholesterolemia   . Hypertension   . Type II diabetes mellitus with renal manifestations ([place])   . Altered mental status   . Diabetes mellitus without complication ([place])   . Brain mass   . Acute renal failure superimposed on stage 5 chronic kidney disease, not on chronic dialysis ([place]) 11/22/2016  . Frequent falls 11/22/2016  . Hyperlipidemia associated with type 2 diabetes mellitus ([place]) 09/27/2014  . Diabetes ([place]) 07/04/2013  . Hypertension associated with diabetes ([place]) 07/04/2013  . Arthritis 07/04/2013    Orientation RESPIRATION BLADDER Height & Weight     Self  O2 (Nasal Canula 2 L) Incontinent, External catheter Weight: 212 lb 15.4 oz (96.6 kg) Height:  5\' 7"   (170.2 cm)  BEHAVIORAL SYMPTOMS/MOOD NEUROLOGICAL BOWEL NUTRITION STATUS  Other (Comment) (Confused)  (None) Incontinent Diet (DYS 2)  AMBULATORY STATUS COMMUNICATION OF NEEDS Skin   Total Care Verbally Normal                       Personal Care Assistance Level of Assistance  Feeding, Total care   Feeding assistance: Maximum assistance (Full supervision/assistance. Feed only when alert. Meds whole with puree.)   Total Care Assistance: Maximum assistance   Functional Limitations Info  Sight, Hearing, Speech Sight Info: Adequate Hearing Info: Adequate Speech Info: Impaired (Clear at times)    SPECIAL CARE FACTORS FREQUENCY  PT (By licensed PT), Blood pressure, OT (By licensed OT)     PT Frequency: 5 x week OT Frequency: 5 x week            Contractures Contractures Info: Not present    Additional Factors Info  Code Status, Allergies Code Status Info: DNR Allergies Info: NKDA           Current Medications (06/17/2017):  This is the current hospital active medication list Current Facility-Administered Medications  Medication Dose Route Frequency Provider Last Rate Last Dose  . 0.9 %  sodium chloride infusion   Intravenous Once Ivor Costa, MD      . acetaminophen (TYLENOL) tablet 650 mg  650 mg Oral Q6H PRN Ivor Costa, MD       Or  . acetaminophen (TYLENOL) suppository 650 mg  650 mg Rectal Q6H PRN Ivor Costa, MD      .  acetaminophen (TYLENOL) tablet 650 mg  650 mg Oral q12n4p Lane Hacker L, DO   650 mg at 06/16/17 1513  . ciprofloxacin (CIPRO) tablet 500 mg  500 mg Oral Q24H Raiford Noble [place], DO   500 mg at 06/16/17 6945  . famotidine (PEPCID) tablet 20 mg  20 mg Oral Daily Raiford Noble [place], DO   20 mg at 06/16/17 1512  . fentaNYL (SUBLIMAZE) injection 25-50 mcg  25-50 mcg Intravenous Q1H PRN Dellinger, Marianne L, PA-C   25 mcg at 06/13/17 1610  . insulin aspart (novoLOG) injection 0-5 Units  0-5 Units Subcutaneous QHS Ivor Costa, MD      . insulin  aspart (novoLOG) injection 0-9 Units  0-9 Units Subcutaneous TID WC Ivor Costa, MD   1 Units at 06/12/17 [phone]  . ipratropium-albuterol (DUONEB) 0.5-2.5 (3) MG/3ML nebulizer solution 3 mL  3 mL Nebulization Q6H PRN Ivor Costa, MD   3 mL at 06/16/17 0540  . MEDLINE mouth rinse  15 mL Mouth Rinse BID Raiford Noble [place], DO   15 mL at 06/16/17 2233  . metroNIDAZOLE (FLAGYL) tablet 500 mg  500 mg Oral [address] Georgina Quint [place], DO   500 mg at 06/16/17 2229  . ondansetron (ZOFRAN) injection 4 mg  4 mg Intravenous Q8H PRN Ivor Costa, MD         Discharge Medications: Please see discharge summary for a list of discharge medications.  Relevant Imaging Results:  Relevant Lab Results:   Additional Information SS#: [id number]  Candie Chroman, LCSW

## 2017-06-17 NOTE — Progress Notes (Addendum)
Assessment completed as documented. Patient is alert only to self. Right upper and right lower extremties are larger than the left side. RL Doralis Pedis has 3 pitting edema. The left upper and lower extremeties are without edema. Cannot discern if patient is in pain when leg moved as patient is not crying out in pain and withdraws from nurse when touched anyway. Notified Dr. Florene Glen via Shea Evans after assessment as Dr. Florene Glen requested.

## 2017-06-17 NOTE — Progress Notes (Signed)
PROGRESS NOTE    John Barr  EXN:[id number] DOB: 03-29-1927 DOA: 06/10/2017 PCP: Mahlon Gammon, MD (Confirm with patient/family/NH records and if not entered, this HAS to be entered at [facility] point of entry. 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 Per patient's daughter, patient was recently diagnosed as ESRD and severe anemia with Hgb 7.9 last week. Pt was given Epogen injection twice. Last dose of Epogen injection was last week. Per pt's daughter, patient has increased weakness in the past several days after the Epogen injection, and became confused since yesterday. Patient moves all extremities, no facial droop or slurred speech. Patient had nausea and vomited once yesterday. He possibly had one episode of loose stool per her daughter. Patient does not have abdominal pain per her daughter, but he has abdominal distention. Admitted to SDU and Mental status slightly improved but then patient started hallucinating. Had Metabolic Acidosis so was changed to Bicarb gtt. Discussed with Nephrology and will need to have Nebo with Family to assess if they would even consider Dialysis prior to Nephrology getting involved.   Patient started becoming Bradycardic and had periods of Asystole so Cardiology was consulted. Palliative Care Medicine was also consulted for Goals of Care and after extensive discussion with the patient's Daughter, daughter elected to change the patient's Code Status to DNR. Patient had a Left Thoracentesis done 8/19 which yielded 1 Liter of Serosanguineous colored fluid. Palliative Care met with Patient's Daughter to discuss goals of care and based on  the conversation, would like to try rehab with palliative care and if patient does poorly and does not rehabe transition to Hospice Care. Goal is not to Rehospitalize and is to preserve quality of life, dignity and comfort. Will obtain PT/OT Consultation for evaluation for SNF and will change IV medications to po now that patient is taking po. PT/OT recommending SNF so Social Worker Consulted to assist with Placement.  Today patient seems worse from mental status standpoint after discussion with daughter and palliative care, may consider trying for inpatient hospice given apparent change in status.  Will continue to discuss palliative and daughter.    Assessment & Plan:   Principal Problem:   Acute metabolic encephalopathy Active Problems:   Acute renal failure superimposed on stage 5 chronic kidney disease, not on chronic dialysis (HCC)   Brain mass   GERD (gastroesophageal reflux disease)   Hypercholesterolemia   Hypertension   Type II diabetes mellitus with renal manifestations (HCC)   Hypernatremia   Hyperkalemia   Hypotension   Sepsis (HCC)   Hypothermia   Normocytic anemia   Abdominal distension   Diverticulitis large intestine   Palliative care encounter   DNR (do not resuscitate)   Bradycardia   Pleural effusion   Acute metabolic encephalopathy, remains confused but pleasantly demented -Likely due to multifactorial etiologies, including acute diverticulitis, sepsis, electrolytes disturbance, worsening renal function.  - CT head is negative for acute intracranial abnormalities. - Admitted to stepdown as inpatient. - Treat underlying issues as below; ? If Encephalopathic from uremia -Frequent neurologic checks - Obtained Swallow Screen and SLP was recommending NPO; Now passed swallow screen for Dysphagia 2 diet.  - Has been disoriented and hallucinating occasionally during this hospitalization, today  was hypophonic, difficult to understand, didn't seem oriented, though  difficult because of hypophonia.  Daughter similarly felt he wasn't doing as well from mental standpoint.   - Palliative Care Involved for [place] meeting and End of Life discussion; Palliative stated patient is at high risk for Suden death and prognosis is poor; Since daughter cannot care for the patient will try rehab with palliative care to follow; If patient does not do well in Rehab recommendation is to transition to Hospice care and overall goal is to maintain dignity, quality of life, and comfort.    - PT/OT to evaluate and treat and recommending SNF  Abdominal Distention: -CT scan showed acute diverticulitis at the mid sigmoid colon, with soft tissue inflammation and focal wall thickening. No evidence of perforation or abscess formation at this time. Underlying mass cannot be excluded.  -IV Cipro and Flagyl (patient received one dose of vancomycin and Zosyn for sepsis before getting back the CT scan results) changed to po now that patient is tolerating po  (8/16 - )  -C/w prn zofran for nausea and vomiting (d/c fentanyl for pain as not receiving this) -D/C'd IVF -Sigmoidoscopy could be considered after completion of treatment for diverticulitis since underlying mass cannot be excluded.   Possible UTI:  -pt has positive urinalysis with trace amount of leukocytes and many bacteria -On IV antibiotics as above -Urine Cx showed No Growth   Sepsis and hypotension and hypothermia, improving slowly -Patient is septic with hypotension and hypothermia. Blood pressure responded to IV fluid.  - hypothermic again this morning, but axillary temp (nursing communication for oral temperatures)  - warming blanket ordered for use if hypothermic [ ]  consider repeating AM cortisol in AM (initial measured around same time as solu cortef given) -Given 2.5 L normal sitting bolus. -abx as above -f/u Bx and Ux; Blood Cx show NGTD at 5 days and Urine Cx showed No Growth -given one dose of Solu Cortef 100 mg  1 -F/u cortisol level (seems to have been drawn minutes after cortisol given) -Procalcitonin 0.19 on admission; Lactic Acid went from 1.31 -> 0.80 -> 0.9 -IVF D/Cd  AoCKD-V: -On AdmissionCreatinine 4.01, BUN 84, potassium 5.2, bicarbonate 17, chloride 121. Pt is not on dialysis yet and unlikely a candidate -IVF now D/C'd  -Creatinine slightly improved today to 4.56 with BUN 71 (peak Cr 4.78) -Will need GOC discussion with family before Nephro sees for possible Dialysis; Patient is unfortunately not a Dialysis Candidate as he is not a Pacemaker Candidate; Code Status changed to DNR and Palliative met with Daughter; Plan is to transition to SNF with Palliative and if patient does not improve go to Hospice -Place Foley Catheter for Accurate I's/O's; Now has Condom Cath  Hyperkalemia: -K+ now 4.9 -D/C'd Bicarb in D5W at 50 mL/hr -Repeat CMP in AM   Hypernatremia, worsening -Sodium 151 today -Due to dehydration. -IV fluid as above now D/C'd -F/u by BMP  DM-II with renal complications: -Last J8H 6.6 on 11/17/16, well controled.  -Patient is taking NovoLog at home -CBG's ranging from 87-102 -C/w SSI  HTN:  -Hold Amlodipine and Metoprolol due to hypotension and Braydcardia  HLD: -Hold Zocor untilmental status improves  Metabolic Acidosis, improved -improved, s/p bicarb gtt  Brain mass:  -Pt has a known suprasellar and sellar mass. [facility]. Patient is not a surgical candidateper neurosurgeon. -Follow up with Neurosurgeon as an outpatient   Bradycardia -Pulse has been dropping into the 20's and 40's with  episodes of Asystole and Pauses of 5 seconds -Metoprolol had been held on Admission because of Hypotension -ECHOCardiogram ordered and Theestimated ejection fraction was in the range of 55% to 60%. Wall motion was normal; there were no regional wall motion abnormalities. The study is not technically sufficient to allow evaluation of LV  diastolic function. -Cardiology Consulted for further evaluation and recommendations -Unfortunately patient is not a good candidate for a permanent Transvenous Pacemaker -Cardiology recommending Discontinuing Telemetry Monitoring   Normocytic anemia: - Hemoglobin dropped from 8.6 on 11/25/16 -> 7.5, then further to 6.7. No GI bleeding noted. - normal iron, low TIBC, elevated ferritin - negative 8/15 - Transfused 1 unit of blood and Hb/Hct improved to 7.7/2.43 (8/16); Hb/Hct now 8.1/25.3 - Repeat CB in AM  GERD: -C/w Pepcid IV  Left Large Pleural Effusion s/p Thoracentesis,  -Seen on ECHO -Thoracentesis done and removed 1 Liter as Palliative still has not met with family  -Follow up CXR showed no pneumothorax or evidence of a complication following left thoracentesis. -C/w DuoNebs scheduled  -Palliative to discuss [place] with Patient's Family and patient likely to go to SNF with Palliative Care to follow   RLE edema - lower extremity dopplers ordered  DVT prophylaxis: SCD's Code Status: DNR Family Communication: daughter Disposition Plan: SNF vs hospice   Consultants:   pallative care   Procedures: (Don't include imaging studies which can be auto populated. Include things that cannot be auto populated i.e. Echo, Carotid and venous dopplers, Foley, Bipap, HD, tubes/drains, wound vac, central lines etc)  S/p thoracentesis   8/19 CXR with R pleural effusion with atelectasis vs consolidation and L lung base improved following thoracentesis  8/16 CT abd/pelvis with acute diverticulitis (consider sigmoidoscopy to r/o underlying mass if appropriate).  Soft tissue inflammation concerning for cystitis.  Small to mod bilateral pleural effusions and partial consolidation of lower lobes. Cholelithiasis and sludge (motion artifact vs cholecystitis - rec correlation with LFTs).  Enlarged prostate.  Aortic atherosclerosis.  Coronary calcifications.   8/16 head CT without acute intracranial  abnormality, stable sellar/suprasellar mass, atrophy and chronic small vessel ischemia  Antimicrobials: (specify start and planned stop date. Auto populated tables are space occupying and do not give end dates)  Cipro/flagyl     Subjective: Denies F, C, abdominal pain.  Per daughter, seems more confuse.  Sleeping a bit more.    Objective: Vitals:   06/16/17 1948 06/16/17 2352 06/17/17 0418 06/17/17 0730  BP:  130/76  133/75  Pulse:  79 82 76  Resp: 18 18 18 18   Temp: (!) 96.5 F (35.8 C) (!) 96.2 F (35.7 C) (!) 96.2 F (35.7 C) (!) 97.4 F (36.3 C)  TempSrc: Oral Axillary Axillary Axillary  SpO2:  93% 94% 96%  Weight:      Height:        Intake/Output Summary (Last 24 hours) at 06/17/17 0915 Last data filed at 06/17/17 0500  Gross per 24 hour  Intake              825 ml  Output              750 ml  Net               75 ml   Filed Weights   06/10/17 2022 06/13/17 0533 06/16/17 0414  Weight: 81.6 kg (180 lb) 97.6 kg (215 lb 2.7 oz) 96.6 kg (212 lb 15.4 oz)    Examination:  General exam: Appears calm and comfortable; difficult to  understand  Respiratory system: Transmitted upper airway sounds, auscultated from anterior as unable to turn patient.   Cardiovascular system: S1 & S2 heard, RRR. No JVD, murmurs, rubs, gallops or clicks. No pedal edema. Gastrointestinal system: Abdomen is distended, nontender. No organomegaly or masses felt. Normal bowel sounds heard. Central nervous system: CN 2-12 grossly intact. No focal neurological deficits. Extremities: 2+ LEE greater on right Skin: No rashes, lesions or ulcers Psychiatry: Difficult to understand as hypophonic.     Data Reviewed: I have personally reviewed following labs and imaging studies  CBC:  Recent Labs Lab 06/13/17 0200 06/14/17 0208 06/15/17 0148 06/16/17 0828 06/17/17 0325  WBC 6.6 8.5 7.7 7.2 7.7  NEUTROABS 5.2 6.7 6.4 5.1 5.4  HGB 8.1* 8.3* 8.4* 8.2* 8.1*  HCT 24.9* 26.2* 26.2* 25.6* 25.3*    MCV 88.3 88.2 87.9 87.4 88.2  PLT 144* 171 154 149* 474*   Basic Metabolic Panel:  Recent Labs Lab 06/13/17 0200 06/14/17 0208 06/15/17 0148 06/16/17 0828 06/17/17 0325  NA 146* 146* 148* 150* 151*  K 5.3* 5.7* 5.6* 5.0 4.9  CL 119* 119* 120* 119* 116*  CO2 16* 15* 19* 24 27  GLUCOSE 106* 97 109* 100* 87  BUN 84* 84* 84* 78* 71*  CREATININE 4.47* 4.66* 4.78* 4.66* 4.56*  CALCIUM 8.3* 8.3* 8.2* 8.2* 8.2*  MG 2.0 2.0 2.2 2.2 2.0  PHOS 6.1* 6.8* 6.9* 5.8* 5.4*   GFR: Estimated Creatinine Clearance: 12.4 mL/min (A) (by C-G formula based on SCr of 4.56 mg/dL (H)). Liver Function Tests:  Recent Labs Lab 06/13/17 0200 06/14/17 0208 06/15/17 0148 06/16/17 0828 06/17/17 0325  AST 48* 36 31 51* 49*  ALT 61 54 46 44 42  ALKPHOS 84 81 78 78 76  BILITOT 0.5 0.5 0.6 0.3 0.4  PROT 6.4* 6.6 6.2* 6.1* 6.0*  ALBUMIN 3.1* 3.3* 3.2* 3.3* 3.2*   No results for input(s): LIPASE, AMYLASE in the last 168 hours.  Recent Labs Lab 06/11/17 0830  AMMONIA 33   Coagulation Profile:  Recent Labs Lab 06/10/17 2037  INR 1.36   Cardiac Enzymes: No results for input(s): CKTOTAL, CKMB, CKMBINDEX, TROPONINI in the last 168 hours. BNP (last 3 results) No results for input(s): PROBNP in the last 8760 hours. HbA1C: No results for input(s): HGBA1C in the last 72 hours. CBG:  Recent Labs Lab 06/16/17 0608 06/16/17 1118 06/16/17 1618 06/16/17 2119 06/17/17 0648  GLUCAP 101* 86 96 85 85   Lipid Profile: No results for input(s): CHOL, HDL, LDLCALC, TRIG, CHOLHDL, LDLDIRECT in the last 72 hours. Thyroid Function Tests: No results for input(s): TSH, T4TOTAL, FREET4, T3FREE, THYROIDAB in the last 72 hours. Anemia Panel: No results for input(s): VITAMINB12, FOLATE, FERRITIN, TIBC, IRON, RETICCTPCT in the last 72 hours. Sepsis Labs:  Recent Labs Lab 06/10/17 2049 06/10/17 2341 06/11/17 0830  PROCALCITON  --   --  0.19  LATICACIDVEN 1.31 0.80 0.9    Recent Results (from the  past 240 hour(s))  Culture, blood (Routine x 2)     Status: None   Collection Time: 06/10/17  8:37 PM  Result Value Ref Range Status   Specimen Description BLOOD LEFT FOREARM  Final   Special Requests   Final    BOTTLES DRAWN AEROBIC AND ANAEROBIC Blood Culture adequate volume   Culture NO GROWTH 5 DAYS  Final   Report Status 06/15/2017 FINAL  Final  Culture, blood (Routine x 2)     Status: None   Collection Time: 06/10/17  9:00  PM  Result Value Ref Range Status   Specimen Description BLOOD LEFT ANTECUBITAL  Final   Special Requests   Final    BOTTLES DRAWN AEROBIC AND ANAEROBIC Blood Culture adequate volume   Culture NO GROWTH 5 DAYS  Final   Report Status 06/15/2017 FINAL  Final  Urine Culture     Status: None   Collection Time: 06/10/17  9:31 PM  Result Value Ref Range Status   Specimen Description URINE, RANDOM  Final   Special Requests NONE  Final   Culture NO GROWTH  Final   Report Status 06/12/2017 FINAL  Final  Culture, body fluid-bottle     Status: None (Preliminary result)   Collection Time: 06/14/17 12:25 PM  Result Value Ref Range Status   Specimen Description FLUID LEFT PLEURAL  Final   Special Requests NONE  Final   Culture NO GROWTH 2 DAYS  Final   Report Status PENDING  Incomplete  Gram stain     Status: None   Collection Time: 06/14/17 12:25 PM  Result Value Ref Range Status   Specimen Description FLUID LEFT PLEURAL  Final   Special Requests NONE  Final   Gram Stain   Final    FEW WBC PRESENT, PREDOMINANTLY MONONUCLEAR NO ORGANISMS SEEN    Report Status 06/14/2017 FINAL  Final         Radiology Studies: No results found.      Scheduled Meds: . acetaminophen  650 mg Oral q12n4p  . ciprofloxacin  500 mg Oral Q24H  . famotidine  20 mg Oral Daily  . insulin aspart  0-5 Units Subcutaneous QHS  . insulin aspart  0-9 Units Subcutaneous TID WC  . mouth rinse  15 mL Mouth Rinse BID  . metroNIDAZOLE  500 mg Oral Q8H   Continuous Infusions: .  sodium chloride       LOS: 7 days    Time spent: 48    A Melven Sartorius, MD [facility] Pager [phone]  If 7PM-7AM, please contact night-coverage [email or web address] Password [email or web address] 06/17/2017, 9:15 AM

## 2017-06-17 NOTE — Progress Notes (Addendum)
Again attempted oral temp, pt allowing this RN to place probe in mouth, but pt unable to hold probe under tongue. Axillary temp obtained, 97.0 degrees. Warm blankets given.

## 2017-06-18 ENCOUNTER — Inpatient Hospital Stay (HOSPITAL_COMMUNITY): Payer: Medicare Other

## 2017-06-18 DIAGNOSIS — R609 Edema, unspecified: Secondary | ICD-10-CM

## 2017-06-18 DIAGNOSIS — Z9889 Other specified postprocedural states: Secondary | ICD-10-CM

## 2017-06-18 DIAGNOSIS — K219 Gastro-esophageal reflux disease without esophagitis: Secondary | ICD-10-CM

## 2017-06-18 LAB — GLUCOSE, CAPILLARY
GLUCOSE-CAPILLARY: 94 mg/dL (ref 65–99)
GLUCOSE-CAPILLARY: 76 mg/dL (ref 65–99)
Glucose-Capillary: 131 mg/dL — ABNORMAL HIGH (ref 65–99)
Glucose-Capillary: 91 mg/dL (ref 65–99)

## 2017-06-18 MED ORDER — HYDROMORPHONE HCL 2 MG PO TABS: 2.0000 mg | ORAL_TABLET | ORAL | Status: DC | PRN

## 2017-06-18 MED ORDER — HALOPERIDOL LACTATE 5 MG/ML IJ SOLN: 1.0000 mg | Freq: Four times a day (QID) | INTRAMUSCULAR | Status: DC | PRN

## 2017-06-18 NOTE — Progress Notes (Signed)
PROGRESS NOTE    Jonanthan Bolender  BUL:[id number] DOB: 1927/05/26 DOA: 06/10/2017 PCP: Mahlon Gammon, MD (Confirm with patient/family/NH records and if not entered, this HAS to be entered at [facility] point of entry. 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 Per patient's daughter, patient was recently diagnosed as ESRD and severe anemia with Hgb 7.9 last week. Pt was given Epogen injection twice. Last dose of Epogen injection was last week. Per pt's daughter, patient has increased weakness in the past several days after the Epogen injection, and became confused since yesterday. Patient moves all extremities, no facial droop or slurred speech. Patient had nausea and vomited once yesterday. He possibly had one episode of loose stool per her daughter. Patient does not have abdominal pain per her daughter, but he has abdominal distention. Admitted to SDU and Mental status slightly improved but then patient started hallucinating. Had Metabolic Acidosis so was changed to Bicarb gtt. Discussed with Nephrology and will need to have [place] with Family to assess if they would even consider Dialysis prior to Nephrology getting involved.   Patient started becoming Bradycardic and had periods of Asystole so Cardiology was consulted. Palliative Care Medicine was also consulted for Goals of Care and after extensive discussion with the patient's Daughter, daughter elected to change the patient's Code Status to DNR. Patient had Aniela Caniglia Left Thoracentesis done 8/19 which yielded 1 Liter of Serosanguineous colored fluid. Palliative Care met with Patient's Daughter to discuss goals of care and based on  the conversation, would like to try rehab with palliative care and if patient does poorly and does not rehabe transition to Hospice Care. Goal is not to Rehospitalize and is to preserve quality of life, dignity and comfort. Will obtain PT/OT Consultation for evaluation for SNF and will change IV medications to po now that patient is taking po. PT/OT recommending SNF so Social Worker Consulted to assist with Placement.  Today patient seems worse from mental status standpoint after discussion with daughter and palliative care, may consider trying for inpatient hospice given apparent change in status.  Will continue to discuss palliative and daughter.   Discussed hospice today with palliative care.  Will plan to try to maximize comfort from this point.  He's continued to have hypothermia, which has an unclear etiology, will try to get TSH/T4 (previous TSH mildly elevated) and cort stim.     Assessment & Plan:   Principal Problem:   Acute metabolic encephalopathy Active Problems:   Acute renal failure superimposed on stage 5 chronic kidney disease, not on chronic dialysis (HCC)   Brain mass   GERD (gastroesophageal reflux disease)   Hypercholesterolemia   Hypertension   Type II diabetes mellitus with renal manifestations (HCC)   Hypernatremia   Hyperkalemia   Hypotension   Sepsis (HCC)   Hypothermia   Normocytic anemia   Abdominal distension   Diverticulitis large intestine   Palliative care encounter   DNR (do not resuscitate)   Bradycardia   Pleural effusion   Acute metabolic encephalopathy, remains confused but pleasantly demented -Likely due to multifactorial etiologies, including acute diverticulitis, sepsis, electrolytes disturbance, worsening renal function.  - CT head is negative for acute intracranial abnormalities. - Admitted to stepdown as inpatient. -  Treat underlying issues as below; ? If Encephalopathic from uremia -Frequent neurologic checks - Obtained Swallow Screen  and SLP was recommending NPO; Now passed swallow screen for Dysphagia 2 diet.  - Today seems more sleepy, less alert than previously.  Hypophonic.   - Palliative Care Involved for [place] meeting and End of Life discussion; given worsening status, planning for hospice.  - PT/OT to evaluate and treat and recommending SNF  Abdominal Distention: -CT scan showed acute diverticulitis at the mid sigmoid colon, with soft tissue inflammation and focal wall thickening. No evidence of perforation or abscess formation at this time. Underlying mass cannot be excluded.  -IV Cipro and Flagyl (patient received one dose of vancomycin and Zosyn for sepsis before getting back the CT scan results) changed to po now that patient is tolerating po  (8/16 - )  -C/w prn zofran for nausea and vomiting (d/c fentanyl for pain as not receiving this) -D/C'd IVF -Sigmoidoscopy could be considered after completion of treatment for diverticulitis since underlying mass cannot be excluded.   Possible UTI:  -pt has positive urinalysis with trace amount of leukocytes and many bacteria -Urine Cx showed No Growth   Sepsis and hypotension and hypothermia-  Hypothermia remains.  It has been present since admission, BP has improved, but hypothermia has persisted.  Required bearhugger last night for hypothermia.  Ddx includes sepsis (blood and urine cx from admission NGTD.  Pleural fluid also NGTD), adrenal insufficiency (cortisol was >100, but this was drawn after hydrocort recorded given), hypothyroidism.  Cause not immediately apparent, but likely related to progressive illness.  Will get TSH/T4 and try to get cortisol to r/o adrenal insufficiency.  -Patient is septic with hypotension and hypothermia. Blood pressure responded to IV fluid.  - hypothermic again this morning, but axillary temp (nursing communication for oral temperatures)  - warming blanket ordered for use if hypothermic _0  consider repeating AM cortisol in AM (initial  measured around same time as solu cortef given) -Given 2.5 L normal sitting bolus. -abx as above -f/u Bx and Ux; Blood Cx show NGTD at 5 days and Urine Cx showed No Growth -given one dose of Solu Cortef 100 mg 1 -F/u cortisol level (seems to have been drawn minutes after cortisol given) -Procalcitonin 0.19 on admission; Lactic Acid went from 1.31 -> 0.80 -> 0.9 -IVF D/Cd  AoCKD-V: -On AdmissionCreatinine 4.01, BUN 84, potassium 5.2, bicarbonate 17, chloride 121. Pt is not on dialysis yet and unlikely Meridian Scherger candidate -IVF now D/C'd  -Creatinine slightly improved today to 4.56 with BUN 71 (peak Cr 4.78) -Will need GOC discussion with family before Nephro sees for possible Dialysis; Patient is unfortunately not Athleen Feltner Dialysis Candidate as he is not Stuart Mirabile Pacemaker Candidate; Code Status changed to DNR and Palliative met with Daughter; Plan is to transition to SNF with Palliative and if patient does not improve go to Hospice -Place Foley Catheter for Accurate I's/O's; Now has Condom Cath  Hyperkalemia: -K+ now 4.9 -D/C'd Bicarb in D5W at 50 mL/hr -Repeat CMP in AM   Hypernatremia, worsening -Sodium 151 yesterday, limiting labs given plan to transition to hospice -Due to dehydration. -IV fluid as above now D/C'd -F/u by BMP  DM-II with renal complications: -Last Q7Y 6.6 on 11/17/16, well controled.  -Patient is taking NovoLog at home -CBG's ranging from 87-102 -C/w SSI  HTN:  -Hold Amlodipine and Metoprolol due to hypotension and Braydcardia  HLD: -Hold Zocor untilmental status improves  Metabolic Acidosis, improved -improved, s/p bicarb gtt  Brain mass:  -Pt has Shiquan Mathieu known suprasellar and sellar mass. Was seen by neurosurgery in [facility]. Patient is not Kahla Risdon surgical candidateper neurosurgeon. -Follow up with Neurosurgeon as an outpatient   Bradycardia -Pulse has been dropping into the 20's and 40's with episodes of Asystole and Pauses of 5 seconds -Metoprolol had been held  on Admission because of Hypotension -ECHOCardiogram ordered and Theestimated ejection fraction was in the range of 55% to 60%. Wall motion was normal; there were no regional wall motion abnormalities. The study is not technically sufficient to allow evaluation of LV diastolic function. -Cardiology Consulted for further evaluation and recommendations -Unfortunately patient is not Declyn Delsol good candidate for Jakara Blatter permanent Transvenous Pacemaker -Cardiology recommending Discontinuing Telemetry Monitoring   Normocytic anemia: - Hemoglobin dropped from 8.6 on 11/25/16 -> 7.5, then further to 6.7. No GI bleeding noted. - normal iron, low TIBC, elevated ferritin - negative hemoccult 8/15 - Transfused 1 unit of blood and Hb/Hct improved to 7.7/2.43 (8/16); Hb/Hct now 8.1/25.3 - Repeat CB in AM  GERD: -C/w Pepcid IV  Left Large Pleural Effusion s/p Thoracentesis,  -Seen on ECHO -Thoracentesis done and removed 1 Liter as Palliative still has not met with family  -Follow up CXR showed no pneumothorax or evidence of [place] complication following left thoracentesis. -C/w DuoNebs scheduled  -Palliative to discuss [place] with Patient's Family and patient likely to go to SNF with Palliative Care to follow   RLE edema - negative LE [place] for VTE  DVT prophylaxis: SCD's Code Status: DNR Family Communication: daughter Disposition Plan: SNF vs hospice   Consultants:   pallative care   Procedures: (Don't include imaging studies which can be auto populated. Include things that cannot be auto populated i.e. Echo, Carotid and venous dopplers, Foley, Bipap, HD, tubes/drains, wound vac, central lines etc)  S/p thoracentesis   8/19 CXR with R pleural effusion with atelectasis vs consolidation and L lung base improved following thoracentesis  8/16 CT abd/pelvis with acute diverticulitis (consider sigmoidoscopy to r/o underlying mass if appropriate).  Soft tissue inflammation concerning for cystitis.  Small to mod  bilateral pleural effusions and partial consolidation of lower lobes. Cholelithiasis and sludge (motion artifact vs cholecystitis - rec correlation with LFTs).  Enlarged prostate.  Aortic atherosclerosis.  Coronary calcifications.   8/16 head CT without acute intracranial abnormality, stable sellar/suprasellar mass, atrophy and chronic small vessel ischemia  Antimicrobials: (specify start and planned stop date. Auto populated tables are space occupying and do not give end dates)  Cipro/flagyl     Subjective: Discussion with daughter and palliative to pursue hospice.  Goal is to make Mr. Gosnell comfortable.   Objective: Vitals:   06/18/17 0500 06/18/17 0600 06/18/17 0822 06/18/17 1229  BP:   137/85 122/68  Pulse:   79 78  Resp:   16 16  Temp: (!) 90.8 F (32.7 C) (!) 92.6 F (33.7 C) (!) 97.4 F (36.3 C) 97.8 F (36.6 C)  TempSrc: Rectal Rectal Axillary Axillary  SpO2:   94% 95%  Weight:      Height:        Intake/Output Summary (Last 24 hours) at 06/18/17 1440 Last data filed at 06/18/17 1100  Gross per 24 hour  Intake              240 ml  Output              900 ml  Net             -660 ml  Filed Weights   06/13/17 0533 06/16/17 0414 06/18/17 0440  Weight: 97.6 kg (215 lb 2.7 oz) 96.6 kg (212 lb 15.4 oz) 103 kg (227 lb)    Examination:  General exam: Appears calm and comfortable; difficult to understand, hypophonic, mostly sleeping Respiratory system: Transmitted upper airway sounds, auscultated anteriorly Cardiovascular system: S1 & S2 heard, RRR. No JVD, murmurs, rubs, gallops or clicks.  Gastrointestinal system: Abdomen is distended, nontender. No organomegaly or masses felt. Normal bowel sounds heard. Central nervous system: CN 2-12 grossly intact. No focal deficits appreciated. Extremities: 2+ LEE greater on right Skin: No rashes, lesions or ulcers Psychiatry: Somnolent, opens eyes occasionally, but difficult to understand.  Hypophonic.     Data Reviewed:  I have personally reviewed following labs and imaging studies  CBC:  Recent Labs Lab 06/13/17 0200 06/14/17 0208 06/15/17 0148 06/16/17 0828 06/17/17 0325  WBC 6.6 8.5 7.7 7.2 7.7  NEUTROABS 5.2 6.7 6.4 5.1 5.4  HGB 8.1* 8.3* 8.4* 8.2* 8.1*  HCT 24.9* 26.2* 26.2* 25.6* 25.3*  MCV 88.3 88.2 87.9 87.4 88.2  PLT 144* 171 154 149* 629*   Basic Metabolic Panel:  Recent Labs Lab 06/13/17 0200 06/14/17 0208 06/15/17 0148 06/16/17 0828 06/17/17 0325  NA 146* 146* 148* 150* 151*  K 5.3* 5.7* 5.6* 5.0 4.9  CL 119* 119* 120* 119* 116*  CO2 16* 15* 19* 24 27  GLUCOSE 106* 97 109* 100* 87  BUN 84* 84* 84* 78* 71*  CREATININE 4.47* 4.66* 4.78* 4.66* 4.56*  CALCIUM 8.3* 8.3* 8.2* 8.2* 8.2*  MG 2.0 2.0 2.2 2.2 2.0  PHOS 6.1* 6.8* 6.9* 5.8* 5.4*   GFR: Estimated Creatinine Clearance: 12.8 mL/min (Sie Formisano) (by C-G formula based on SCr of 4.56 mg/dL (H)). Liver Function Tests:  Recent Labs Lab 06/13/17 0200 06/14/17 0208 06/15/17 0148 06/16/17 0828 06/17/17 0325  AST 48* 36 31 51* 49*  ALT 61 54 46 44 42  ALKPHOS 84 81 78 78 76  BILITOT 0.5 0.5 0.6 0.3 0.4  PROT 6.4* 6.6 6.2* 6.1* 6.0*  ALBUMIN 3.1* 3.3* 3.2* 3.3* 3.2*   No results for input(s): LIPASE, AMYLASE in the last 168 hours. No results for input(s): AMMONIA in the last 168 hours. Coagulation Profile: No results for input(s): INR, PROTIME in the last 168 hours. Cardiac Enzymes: No results for input(s): CKTOTAL, CKMB, CKMBINDEX, TROPONINI in the last 168 hours. BNP (last 3 results) No results for input(s): PROBNP in the last 8760 hours. HbA1C: No results for input(s): HGBA1C in the last 72 hours. CBG:  Recent Labs Lab 06/17/17 1154 06/17/17 1627 06/17/17 2058 06/18/17 0548 06/18/17 1114  GLUCAP 102* 126* 125* 94 76   Lipid Profile: No results for input(s): CHOL, HDL, LDLCALC, TRIG, CHOLHDL, LDLDIRECT in the last 72 hours. Thyroid Function Tests: No results for input(s): TSH, T4TOTAL, FREET4, T3FREE,  THYROIDAB in the last 72 hours. Anemia Panel: No results for input(s): VITAMINB12, FOLATE, FERRITIN, TIBC, IRON, RETICCTPCT in the last 72 hours. Sepsis Labs: No results for input(s): PROCALCITON, LATICACIDVEN in the last 168 hours.  Recent Results (from the past 240 hour(s))  Culture, blood (Routine x 2)     Status: None   Collection Time: 06/10/17  8:37 PM  Result Value Ref Range Status   Specimen Description BLOOD LEFT FOREARM  Final   Special Requests   Final    BOTTLES DRAWN AEROBIC AND ANAEROBIC Blood Culture adequate volume   Culture NO GROWTH 5 DAYS  Final   Report Status 06/15/2017 FINAL  Final  Culture, blood (Routine x 2)     Status: None   Collection Time: 06/10/17  9:00 PM  Result Value Ref Range Status   Specimen Description BLOOD LEFT ANTECUBITAL  Final   Special Requests   Final    BOTTLES DRAWN AEROBIC AND ANAEROBIC Blood Culture adequate volume   Culture NO GROWTH 5 DAYS  Final   Report Status 06/15/2017 FINAL  Final  Urine Culture     Status: None   Collection Time: 06/10/17  9:31 PM  Result Value Ref Range Status   Specimen Description URINE, RANDOM  Final   Special Requests NONE  Final   Culture NO GROWTH  Final   Report Status 06/12/2017 FINAL  Final  Culture, body fluid-bottle     Status: None (Preliminary result)   Collection Time: 06/14/17 12:25 PM  Result Value Ref Range Status   Specimen Description FLUID LEFT PLEURAL  Final   Special Requests NONE  Final   Culture NO GROWTH 4 DAYS  Final   Report Status PENDING  Incomplete  Gram stain     Status: None   Collection Time: 06/14/17 12:25 PM  Result Value Ref Range Status   Specimen Description FLUID LEFT PLEURAL  Final   Special Requests NONE  Final   Gram Stain   Final    FEW WBC PRESENT, PREDOMINANTLY MONONUCLEAR NO ORGANISMS SEEN    Report Status 06/14/2017 FINAL  Final         Radiology Studies: No results found.      Scheduled Meds: . acetaminophen  650 mg Oral q12n4p  .  ciprofloxacin  500 mg Oral Q24H  . insulin aspart  0-5 Units Subcutaneous QHS  . insulin aspart  0-9 Units Subcutaneous TID WC  . mouth rinse  15 mL Mouth Rinse BID  . metroNIDAZOLE  500 mg Oral Q8H   Continuous Infusions: . sodium chloride Stopped (06/17/17 1052)     LOS: 8 days    Time spent: 11    Jin Capote Melven Sartorius, MD [facility] Pager [phone]  If 7PM-7AM, please contact night-coverage [email or web address] Password [email or web address] 06/18/2017, 2:40 PM

## 2017-06-18 NOTE — Progress Notes (Signed)
Clinical Social Worker is following patient and family for support and discharge needs. At this time family has chosen residential hospice as patient is now rapidly declining. CSW has made referrals to residential hospice and will follow up with family with availability.   John Barr, MSW,  [place]

## 2017-06-18 NOTE — Progress Notes (Signed)
ANTIBIOTIC CONSULT NOTE   Pharmacy Consult for Cipro Indication: diverticulitis  No Known Allergies  Patient Measurements: Height: 5\' 7"  (170.2 cm) Weight: 227 lb (103 kg) IBW/kg (Calculated) : 66.1 Adjusted Body Weight:    Vital Signs: Temp: 97.8 F (36.6 C) (08/23 1229) Temp Source: Axillary (08/23 1229) BP: 122/68 (08/23 1229) Pulse Rate: 78 (08/23 1229) Intake/Output from previous day: 08/22 0701 - 08/23 0700 In: 360 [P.O.:360] Out: 1700 [Urine:1700] Intake/Output from this shift: No intake/output data recorded.  Labs:  Recent Labs  06/16/17 0828 06/17/17 0325  WBC 7.2 7.7  HGB 8.2* 8.1*  PLT 149* 149*  CREATININE 4.66* 4.56*   Estimated Creatinine Clearance: 12.8 mL/min (A) (by C-G formula based on SCr of 4.56 mg/dL (H)). No results for input(s): VANCOTROUGH, VANCOPEAK, VANCORANDOM, GENTTROUGH, GENTPEAK, GENTRANDOM, TOBRATROUGH, TOBRAPEAK, TOBRARND, AMIKACINPEAK, AMIKACINTROU, AMIKACIN in the last 72 hours.   Microbiology:   Medical History: Past Medical History:  Diagnosis Date  . Arthritis   . Diabetes mellitus without complication ([place])   . Exposure to TB    hx  . GERD (gastroesophageal reflux disease)   . High frequency hearing loss   . Hypercholesterolemia   . Hypertension    Assessment: ID: [id number] Cipro/Flagyl for possible diverticulitis. Urine dirty. Temps all low (90.8! to 97.4), WBC wnl. Possibly UTI. L pleural fluid cultures pending. Scr 4.56 stable. Pleural fluid cultures still pending. - CT scan showed acute diverticulitis at the mid sigmoid colon, with soft tissue inflammation and focal wall thickening. No evidence of perforation or abscess formation at this time.  Vanc x 1 on 8/15 Zosyn x 1 on 8/15 Cipro 8/16 >> Flagyl IV 8/16 >>  8/15 blood cx x 2 - Negative 8/15 urine - Negative 8/19: L pleural fluid>>   Goal of Therapy:  Eradication of infection  Plan:  Cipro 500mg  po q 24h dose ok for renal function. Please consider  d/c Pharmacy will sign off. Please reconsult for further dosing assitance.    Andreanna Mikolajczak S. Alford Highland, PharmD, BCPS Clinical Staff Pharmacist Pager [phone]  Eilene Ghazi Stillinger 06/18/2017,1:07 PM

## 2017-06-18 NOTE — Care Management Note (Signed)
Case Management Note Marvetta Gibbons RN, BSN Unit 4E-Case Manager [phone]  Patient Details  Name: Koray Soter MRN: [id number] Date of Birth: 09/27/1927  Subjective/Objective:    Pt admitted with acute encephalopathy- ESRD not a candidate for HD, bradycardia not a candidate for PPM.                  Action/Plan: PTA pt lived at home with wife and daughter- PC consulted for [place], [place] following for d/c needs- plan SNF vs Hospice  Expected Discharge Date:  06/19/17               Expected Discharge Plan:  [place]  In-House Referral:  Clinical Social Work  Discharge planning Services  CM Consult  Post Acute Care Choice:  NA Choice offered to:  NA  DME Arranged:    DME Agency:     HH Arranged:    [place] Agency:     Status of Service:  Completed, signed off  If discussed at [employer] of Stay Meetings, dates discussed:  8/21, 8/23  Discharge Disposition: hospice facility   Additional Comments:  06/18/17- 1530 - Marvetta Gibbons RN, CM- plan for hospice facility- CSW following - pt will d/c tomorrow to [facility] or [facility]- (neither has bed today)- whichever one has bed available tomorrow pt will d/c to.- CSW will f/u in am-   Dawayne Patricia, RN 06/18/2017, 3:45 PM

## 2017-06-18 NOTE — Progress Notes (Addendum)
Spoke with Vascular technician this am regarding Vascular Ultrasound of Right lower extremity not being done yesterday as ordered; told that the Ultrasound was attempted yesterday but patient was combative, pushing away. The Vascular technician will be in at 830 am and will attempt to do the [place] this morning, per [facility] Vascular [phone]).

## 2017-06-18 NOTE — Progress Notes (Signed)
[facility])  Received request from [place] for family interest in [facility]. Chart reviewed, no family present at time of this visit. Spoke with daughter/HCPOA John Barr by phone to confirm interest and make her aware [facility] is not able to offer a room this afternoon. John Barr and John Barr both aware [facility] will update tomorrow morning re availability. John Barr has [facility] contact information in case she has questions before tomorrow morning.   Thank you, John Conte, LCSW [phone]

## 2017-06-18 NOTE — Consult Note (Signed)
[facility] [place]: TC from [place] at [facility]. Requesting residential hospice bed.   TC to daughter Adonis Huguenin-- she is not at bedside. She has left to take her mother to HD. She states that she will not be complete with her treatment until after 500pm. We plan to speak in the am at 930.  Will honor there wishes as first choice is [facility] if they have bed offer. Webb Silversmith

## 2017-06-18 NOTE — Progress Notes (Signed)
Off unit to Vascular US, left unit via low bed, accompanied by transportation technicians. Patient is quiet, eyes are opened; no s/sx of distress

## 2017-06-18 NOTE — Progress Notes (Signed)
Rectal temp this AM 90.8; bair hugger placed. Pt currently resting. Will continue to monitor.

## 2017-06-18 NOTE — Progress Notes (Signed)
Palliative Care Progress Note  Met with patient's daughter today at bedside. He has had a significant decline since I last met with her- he is hypothermic, mental confusion persists, he is not eating well and sleeping more. I suspect he is becoming increasingly uremic and having metabolic derangements. Hypernatremia, brain mass may also be contributing.   Goals for John Barr are comfort and dignity. His daughter John Barr understands how seriously ill he is is and that our medical options and interventions are limited beyond what we are currently providing.   I recommended a comfort care approach and believe based on his decline that he is appropriate for a hospice facility for his care- minimal PO intake, sleeping more, not able to interact. His prognosis will likely be less than 2 weeks and I anticipate he will need escalating symptom management interventions.  Cancel [facility] transfer- he will not be able to rehab in his current condition and that is not in line with his goals of care.  Daughter agreeable to hospice facility referral being made.  I discussed case with Dr. Florene Glen [facility].  Lane Hacker, DO Palliative Medicine [phone]  Time: 25 min Greater than 50%  of this time was spent counseling and coordinating care related to the above assessment and plan.

## 2017-06-18 NOTE — Progress Notes (Signed)
VASCULAR LAB PRELIMINARY  PRELIMINARY  PRELIMINARY  PRELIMINARY  Right lower extremity venous duplex completed.    Preliminary report:  Right:  No evidence of DVT, superficial thrombosis, or Baker's cyst.  John Barr, RVT 06/18/2017, 9:20 AM

## 2017-06-19 LAB — CULTURE, BODY FLUID W GRAM STAIN -BOTTLE: Culture: NO GROWTH

## 2017-06-19 LAB — TSH: TSH: 5.861 u[IU]/mL — ABNORMAL HIGH (ref 0.350–4.500)

## 2017-06-19 LAB — GLUCOSE, CAPILLARY
GLUCOSE-CAPILLARY: 94 mg/dL (ref 65–99)
Glucose-Capillary: 92 mg/dL (ref 65–99)

## 2017-06-19 LAB — T4, FREE: FREE T4: 0.66 ng/dL (ref 0.61–1.12)

## 2017-06-19 LAB — CORTISOL: Cortisol, Plasma: 11 ug/dL

## 2017-06-19 MED ORDER — HALOPERIDOL LACTATE 5 MG/ML IJ SOLN: 1.0000 mg | Freq: Four times a day (QID) | INTRAMUSCULAR | Status: AC | PRN

## 2017-06-19 MED ORDER — CIPROFLOXACIN HCL 500 MG PO TABS: 500.0000 mg | ORAL_TABLET | ORAL | 0 refills | Status: AC

## 2017-06-19 MED ORDER — HYDROMORPHONE HCL 2 MG PO TABS: 2.0000 mg | ORAL_TABLET | ORAL | 0 refills | Status: AC | PRN

## 2017-06-19 MED ORDER — ACETAMINOPHEN 500 MG PO TABS: 1000.0000 mg | ORAL_TABLET | Freq: Three times a day (TID) | ORAL | 0 refills | Status: AC | PRN

## 2017-06-19 MED ORDER — ONDANSETRON HCL 4 MG/2ML IJ SOLN: 4.0000 mg | Freq: Three times a day (TID) | INTRAMUSCULAR | 0 refills | Status: AC | PRN

## 2017-06-19 MED ORDER — METRONIDAZOLE 500 MG PO TABS: 500.0000 mg | ORAL_TABLET | Freq: Three times a day (TID) | ORAL | 0 refills | Status: AC

## 2017-06-19 NOTE — Consult Note (Signed)
[facility] [place]: Spoke to John Barr for [facility]-- They do have a bed to offer the pt. John Barr the daughter is here to meet me and I did let her know that [facility] did have a bed to offer them today and that I know this was there first choice. She felt that if they offered the bed she would prefer them. I let her know that John Barr would be calling her. John Barr called right after this and they offered bed and she accepted.  Thank you for thinking of [place]. Webb Silversmith RN [phone]

## 2017-06-19 NOTE — Progress Notes (Signed)
Clinical Social Worker facilitated patient discharge including contacting patient family and facility to confirm patient discharge plans.  Clinical information faxed to facility and family agreeable with plan.  CSW arranged ambulance transport via [facility] to [facility] .  RN to call [phone] report prior to discharge.  Clinical Social Worker will sign off for now as social work intervention is no longer needed. Please consult us again if new need arises.  Kama Cammarano, MSW, LCSWA [phone]  

## 2017-06-19 NOTE — Progress Notes (Addendum)
Report called to nurse Colletta Maryland at [facility]. Patient resting quietly in bed, skin warm and dry, no sign of distress observed, will continue to monitor.

## 2017-06-19 NOTE — Progress Notes (Signed)
Patient in a stable condition, patient transported to [facility] by [employer] [facility].

## 2017-06-19 NOTE — Care Management Important Message (Signed)
Important Message  Patient Details  Name: John Barr MRN: [id number] Date of Birth: 08-07-1927   Medicare Important Message Given:  Yes    Saretta Dahlem Abena 06/19/2017, 11:00 AM

## 2017-06-19 NOTE — Discharge Summary (Signed)
Physician Discharge Summary  John Barr JME:[id number] DOB: [birth year] DOA: 06/10/2017  PCP: Mahlon Gammon, MD  Admit date: 06/10/2017 Discharge date: 06/19/2017  Time spent: 35 minutes  Recommendations for Outpatient Follow-up:  1. Discharge to hospice 2. Complete 10 day course cipro/flagyl for diverticulitis (last dose of cipro tomorrow AM, last dose of flagyl should be tomorrow after 3 doses) 3. S/p thoracentesis, if worsening resp status, consider repeat for comfort as needed  4. Will discontinue amlodipine, simvasatin.  Continue sliding scale insulin, though watch for lows as his BG's here have recently been in the 90's and low 100's. 5. Zofran, haldol, dialudid, APAP ordered for comfort, but defer to hospice facility    Discharge Diagnoses:  Principal Problem:   Acute metabolic encephalopathy Active Problems:   Acute renal failure superimposed on stage 5 chronic kidney disease, not on chronic dialysis ([place])   Brain mass   GERD (gastroesophageal reflux disease)   Hypercholesterolemia   Hypertension   Type II diabetes mellitus with renal manifestations (HCC)   Hypernatremia   Hyperkalemia   Hypotension   Sepsis ([place])   Hypothermia   Normocytic anemia   Abdominal distension   Diverticulitis large intestine   Palliative care encounter   DNR (do not resuscitate)   Bradycardia   Pleural effusion   Discharge Condition: poor  Diet recommendation: Dysphagia 2, thin liquid  Filed Weights   06/13/17 0533 06/16/17 0414 06/18/17 0440  Weight: 97.6 kg (215 lb 2.7 oz) 96.6 kg (212 lb 15.4 oz) 103 kg (227 lb)    History of present illness:  John Peoplesis a 83 y.o.malewith medical history significant of hypertension, hyperlipidemia, diabetes mellitus, GERD, hearing loss, arthritis, 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 Per patient's daughter, patient was recently diagnosed as  ESRD and severe anemia with Hgb 7.9 last week. Pt was given Epogen injection twice. Last dose of Epogen injection was last week. Per pt's daughter, patient has increased weakness in the past several days after the Epogen injection, and became confused since yesterday. Patient moves all extremities, no facial droop or slurred speech. Patient had nausea and vomited once on the day prior to admission. He possibly had one episode of loose stool per her daughter. Patient does not have abdominal pain per her daughter, but he has abdominal distention. He was admitted to the SDU with acute metabolic encephalopathy, acute diverticulitis, and concern for a possible UTI.  He was septic and was notably hypothermic. He was placed on a bicarb gtt for metabolic acidosis and discussed renal func with nephrology who rec GOC prior to considering dialysis.  Admitted to SDU and Mental status slightly improved but then patient started hallucinating. Had Metabolic Acidosis so was changed to Bicarb gtt. Discussed with Nephrology and will need to have [name] with Family to assess if he would be dialysis candidate.  After conversation with palliative care, decided for DNR.  Cardiology was consulted for sinus bradycardia and sinus pauses, who recommended DC telemetry and noted not a candidate for permanent tranvenous pacer.  Pt also determined not to be a dialysis candidate. Of note, he's been hypothermic with unclear etiology during his hospitalization, most likely 2/2 sepsis with diverticulitis and his acute illness.  He had subclinical hypothyroidism on testing, AM cortisol was in normal range (greater than 10, making adrenal insufficiency unlikely).  Blood cultures and urine cultures have been NGTD since admission as well as pleural fluid cx.   Patient had  a Left Thoracentesis done 8/19 which yielded 1 Liter of Serosanguineous colored fluid. Palliative Care met with Patient's Daughter to discuss goals of care and based on the  conversation, initially was planning on trying rehab with palliative and possible transition to hospice if patient does not do well.  Goal is not to Rehospitalize and is to preserve quality of life, dignity and comfort, but patient's mental status continued to deteriorate and he seemed progressively worse after 8/22 and the decision was made to pursue hospice.  He was discharged to inpatient hospice on 06/19/17.  Plan to continue antibiotics for diverticulitis for total 10 day course.      Hospital Course:  Acute metabolic encephalopathy:  Progressive, now increasingly sleepy.  Difficult to understand as hypophonic.   -Likely due to multifactorial etiologies, including acute diverticulitis, sepsis, electrolytes disturbance, worsening renal function/uremia.  -CT head is negative for acute intracranial abnormalities. -Frequent neurologic checks - Obtained Swallow Screen and SLP was recommending NPO; Now passed swallow screen for Dysphagia 2 diet. - Palliative Care Involved for [place] meeting and End of Life discussion; given worsening status, planning for hospice.  - PT/OT to evaluate and treat and recommending SNF  Abdominal Distention: -CT scan showed acute diverticulitis at the mid sigmoid colon, with soft tissue inflammation and focal wall thickening. No evidence of perforation or abscess formation at this time. Underlying mass cannot be excluded.  -IV Cipro and Flagyl (patient received one dose of vancomycin and Zosyn for sepsis before getting back the CT scan results) changed to po now that patient is tolerating po  (8/16 -8/25)  -C/w prn zofran for nausea and vomiting (d/c fentanyl for pain as not receiving this) -D/C'd IVF -Sigmoidoscopy could be considered after completion of treatment for diverticulitis since underlying mass cannot be excluded, though this is likely inconsistent with comfort care goals  Possible UTI:  -pt has positive urinalysis with trace amount of leukocytes and many  bacteria -Urine Cx showed No Growth   Sepsis and hypotension and hypothermia-  Hypothermia remains.  It has been present since admission, BP has improved, but hypothermia has persisted.  Required bearhugger last night for hypothermia.  Ddx includes sepsis (blood and urine cx from admission NGTD.  Pleural fluid also NGTD), adrenal insufficiency (cortisol was >100, but this was drawn after hydrocort recorded given), hypothyroidism.  Cause not immediately apparent, but likely related to progressive illness.  AM cortisol suggests against adrenal insufficiency and subclinical hypothyroidism also noted.  Discussed with daughter, likely due to his overall clinical illness, but still unclear, but decided against further work up with goals for hospice and comfort.   -Patient is septic with hypotension and hypothermia. Blood pressure responded to IV fluid.  - hypothermic again this morning, but axillary temp (nursing communication for oral temperatures)  - warming blanket ordered for use if hypothermic -Given 2.5 L normal sitting bolus. -abx as above -f/u Bx and Ux; Blood Cx show NGTD at 5days and Urine Cx showed No Growth -given one dose of Solu Cortef 100 mg 1 -F/u cortisol level (seems to have been drawn minutes after cortisol given) -Procalcitonin 0.19 on admission; Lactic Acid went from 1.31 -> 0.80 -> 0.9 -IVF D/Cd  AoCKD-V:Not a dialysis candidate.  -On AdmissionCreatinine 4.01, BUN 84, potassium 5.2, bicarbonate 17, chloride 121. Pt is not on dialysis yet and unlikely a candidate -IVF now D/C'd  -Creatinine slightly improved today to 4.56 with BUN 71 (peak Cr 4.78) -Will need GOC discussion with family before Nephro sees for  possible Dialysis; Patient is unfortunately not a Dialysis Candidate as he is not a Pacemaker Candidate; Code Status changed to DNR and Palliative met with Daughter; Plan is to transition to SNF with Palliative and if patient does not improve go to Hospice -Place Foley  Catheter for Accurate I's/O's; Now has Condom Cath  Hyperkalemia: -K+ now4.9 -D/C'd Bicarb in D5W at 50 mL/hr -Repeat CMP in AM   Hypernatremia, worsening -Sodium 151 yesterday, limiting labs given plan to transition to hospice -Due to dehydration. -IV fluid as above now D/C'd -F/u by BMP  DM-II with renal complications: -Last W2O 6.6 on 11/17/16, well controled.  -Patient is taking NovoLog at home -CBG's ranging from 87-102 -C/w SSI  HTN:  -Hold Amlodipine and Metoprolol due to hypotension and Braydcardia  HLD: -Hold Zocor untilmental status improves  Metabolic Acidosis, improved -improved, s/p bicarb gtt  Brain mass:  -Pt has a known suprasellar and sellar mass. Was seen by neurosurgery in [facility]. Patient is not a surgical candidateper neurosurgeon. -Follow up with Neurosurgeon as an outpatient   Bradycardia -Pulse has been dropping into the 20's and 40's with episodes of Asystole and Pauses of 5 seconds -Metoprolol had been held on Admission because of Hypotension -ECHOCardiogram ordered and Theestimated ejection fraction was in the range of 55% to 60%. Wall motion was normal; there were no regional wall motion abnormalities. The study is not technically sufficient to allow evaluation of LV diastolic function. -Cardiology Consulted for further evaluation and recommendations -Unfortunately patient is not a good candidate for a permanent Transvenous Pacemaker -Cardiology recommending Discontinuing Telemetry Monitoring   Normocytic anemia: - Hemoglobin dropped from 8.6 on 11/25/16 -> 7.5, then further to 6.7. No GI bleeding noted. - normal iron, low TIBC, elevated ferritin - negative hemoccult 8/15 - Transfused 1 unit of blood and Hb/Hct improved to 7.7/2.43 (8/16); Hb/Hct now 8.1/25.3 - Repeat CB in AM  GERD: -C/w Pepcid IV  Left Large Pleural Effusion s/p Thoracentesis,  -Seen on ECHO -Thoracentesis done and removed 1 Liter as Palliative still  has not met with family  -Follow up CXR showed no pneumothorax or evidence of a complication following left thoracentesis. -C/w DuoNebs scheduled  -Palliative to discuss [place] with Patient's Family and patient likely to go to SNF with Palliative Care to follow   RLE edema - negative LE [place] for VTE  Procedures:  Thoracentesis  Echo with normal EF, large L pleural effusion  LE [place] without evidence of VTE   Consultations:  Palliative  Cardiology  Discharge Exam: Vitals:   06/19/17 1137 06/19/17 1202  BP:  (!) 141/78  Pulse:  61  Resp:  16  Temp: (!) 92.6 F (33.7 C)   SpO2:  94%    General: sleepy Cardiovascular: RRR, no mgr Respiratory: CTAB, on [place] Abd: S/NT/ND Ext: 2+ LEE, R>L  Discharge Instructions   Discharge Instructions    DIET DYS 2    Complete by:  As directed    Fluid consistency:  Thin   Discharge instructions    Complete by:  As directed    Mr. Torti is being discharged to hospice after being treated for diverticulitis, sepsis, and worsening renal failure.  Due to the progression of his illness, the decision was made to pursue comfort measures.   Increase activity slowly    Complete by:  As directed    Increase activity slowly    Complete by:  As directed      Current Discharge Medication List  START taking these medications   Details  ciprofloxacin (CIPRO) 500 MG tablet Take 1 tablet (500 mg total) by mouth daily. Qty: 1 tablet, Refills: 0    haloperidol lactate (HALDOL) 5 MG/ML injection Inject 0.2 mLs (1 mg total) into the vein every 6 (six) hours as needed. Qty: 1 mL    HYDROmorphone (DILAUDID) 2 MG tablet Take 1 tablet (2 mg total) by mouth every 2 (two) hours as needed for moderate pain (Dyspnea/Shortness of Breath). Qty: 30 tablet, Refills: 0    metroNIDAZOLE (FLAGYL) 500 MG tablet Take 1 tablet (500 mg total) by mouth every 8 (eight) hours. Qty: 5 tablet, Refills: 0    ondansetron (ZOFRAN) 4 MG/2ML SOLN injection Inject 2 mLs  (4 mg total) into the vein every 8 (eight) hours as needed for nausea or vomiting. Qty: 2 mL, Refills: 0      CONTINUE these medications which have CHANGED   Details  acetaminophen (TYLENOL) 500 MG tablet Take 2 tablets (1,000 mg total) by mouth every 8 (eight) hours as needed for moderate pain or headache. Qty: 30 tablet, Refills: 0      CONTINUE these medications which have NOT CHANGED   Details  insulin aspart (NOVOLOG) 100 UNIT/ML injection Inject 0-9 Units into the skin 3 (three) times daily with meals. Qty: 10 mL, Refills: 11    omeprazole (PRILOSEC) 20 MG capsule TAKE 1 CAPSULE (20 MG TOTAL) BY MOUTH DAILY. Qty: 30 capsule, Refills: 1    ferrous sulfate 325 (65 FE) MG tablet Take 1 tablet (325 mg total) by mouth 2 (two) times daily with a meal. Qty: 60 tablet, Refills: 0      STOP taking these medications     amLODipine (NORVASC) 10 MG tablet      metoprolol tartrate (LOPRESSOR) 25 MG tablet      simvastatin (ZOCOR) 40 MG tablet        No Known Allergies    The results of significant diagnostics from this hospitalization (including imaging, microbiology, ancillary and laboratory) are listed below for reference.    Significant Diagnostic Studies: Ct Abdomen Pelvis Wo Contrast  Result Date: 06/11/2017 CLINICAL DATA:  Acute onset of generalized weakness. Nausea. Initial encounter. EXAM: CT ABDOMEN AND PELVIS WITHOUT CONTRAST TECHNIQUE: Multidetector CT imaging of the abdomen and pelvis was performed following the standard protocol without IV contrast. COMPARISON:  Renal ultrasound from 11/22/2016 FINDINGS: Lower chest: Small to moderate bilateral pleural effusions are noted, with partial consolidation of both lower lung lobes. Diffuse coronary artery calcifications are seen. Hepatobiliary: The liver is unremarkable. Stones and likely sludge are noted within the gallbladder. Mild soft tissue inflammation about the gallbladder may reflect cholecystitis. The common bile duct  remains normal in caliber. Pancreas: The pancreas is within normal limits. Spleen: The spleen is unremarkable in appearance. Adrenals/Urinary Tract: The adrenal glands are unremarkable in appearance. Diffuse perinephric stranding is noted bilaterally. There is no evidence of hydronephrosis. No renal or ureteral stones are identified. Stomach/Bowel: Focal wall thickening is noted at the mid sigmoid colon, with associated soft tissue inflammation and inflamed diverticula, concerning for acute diverticulitis. Minimal underlying diverticulosis is noted. There is no evidence of perforation or abscess formation at this time. Vascular/Lymphatic: Scattered calcification is seen along the abdominal aorta and its branches. The abdominal aorta is otherwise grossly unremarkable. The inferior vena cava is grossly unremarkable. No retroperitoneal lymphadenopathy is seen. No pelvic sidewall lymphadenopathy is identified. Reproductive: Soft tissue inflammation about the bladder raises concern for cystitis. The prostate is  borderline enlarged. Other: No additional soft tissue abnormalities are seen. Musculoskeletal: No acute osseous abnormalities are identified. Intervertebral disc space narrowing is noted at L4-L5. Multilevel vacuum phenomenon is noted along the lumbar spine. The visualized musculature is unremarkable in appearance. IMPRESSION: 1. Acute diverticulitis at the mid sigmoid colon, with soft tissue inflammation and focal wall thickening. No evidence of perforation or abscess formation at this time. Underlying mass cannot be excluded. Sigmoidoscopy could be considered after completion of treatment for diverticulitis, if deemed clinically appropriate. 2. Soft tissue inflammation about the bladder raises concern for cystitis. 3. Small to moderate bilateral pleural effusions, with partial consolidation of both lower lung lobes. 4. Cholelithiasis and likely sludge within the gallbladder. Mild soft tissue inflammation about  the gallbladder may reflect cholecystitis, or may reflect motion artifact. Would correlate with the patient's LFTs. 5. Minimal underlying diverticulosis at the sigmoid colon. 6. Borderline enlarged prostate. 7. Scattered aortic atherosclerosis. 8. Diffuse coronary artery calcifications seen. Electronically Signed   By: Garald Balding M.D.   On: 06/11/2017 00:46   Dg Chest 1 View  Result Date: 06/14/2017 CLINICAL DATA:  S/P thoracentesis. 1L removed from left lung. EXAM: CHEST 1 VIEW COMPARISON:  06/10/2017 FINDINGS: Left pleural effusion has been evacuated.  No left pneumothorax. Small right pleural effusion is similar to the prior exam. The cardiac silhouette is enlarged. IMPRESSION: 1. No pneumothorax or evidence of a complication following left thoracentesis. Electronically Signed   By: Lajean Manes M.D.   On: 06/14/2017 13:09   Ct Head Wo Contrast  Result Date: 06/11/2017 CLINICAL DATA:  Altered mental status.  Weakness. EXAM: CT HEAD WITHOUT CONTRAST TECHNIQUE: Contiguous axial images were obtained from the base of the skull through the vertex without intravenous contrast. COMPARISON:  Head CT 11/23/2016 FINDINGS: Brain: Mass lesion in the sella/ suprasellar cistern is similar appearance to prior CT. No evidence of progression. No intracranial hemorrhage. No acute ischemia. Stable atrophy and chronic white matter disease. Small evidence days in both basal ganglia suggest prior lacunar infarcts versus prominent perivascular spaces. No hydrocephalus, the basilar cisterns are patent. Vascular: Atherosclerosis of skullbase vasculature without hyperdense vessel or abnormal calcification. Skull: No focal lesion or skull fracture. Sinuses/Orbits: Paranasal sinuses and mastoid air cells are clear. The visualized orbits are unremarkable. Probable bilateral cataract section. Other: None. IMPRESSION: 1.  No acute intracranial abnormality. 2. Stable CT appearance of sellar/ suprasellar mass. No evidence of  significant progression from exam 7 months prior. 3. Stable atrophy and chronic small vessel ischemia. Electronically Signed   By: Jeb Levering M.D.   On: 06/11/2017 00:44   Dg Chest Port 1 View  Result Date: 06/15/2017 CLINICAL DATA:  83 year old male with confusion and shortness of breath. Pleural effusions, left side thoracentesis yesterday. EXAM: PORTABLE CHEST 1 VIEW COMPARISON:  06/14/2017 and earlier FINDINGS: Portable AP semi upright view at 0655 hours. Basilar in veiling opacity in the right lung has not significantly changed since 06/10/2017. Left lung base ventilation remains improved following thoracentesis. No pneumothorax identified. Stable cardiac size and mediastinal contours. No areas of worsening ventilation. IMPRESSION: 1. Persistent right pleural effusion with lung base atelectasis or consolidation. 2. Left lung base ventilation remains improved following thoracentesis yesterday. 3. No new cardiopulmonary abnormality. Electronically Signed   By: Genevie Ann M.D.   On: 06/15/2017 07:33   Dg Chest Portable 1 View  Result Date: 06/10/2017 CLINICAL DATA:  Sepsis EXAM: PORTABLE CHEST 1 VIEW COMPARISON:  11/22/2016 FINDINGS: Cardiac shadow is enlarged. Bilateral pleural effusions are  noted left greater than right. There is likely underlying basilar atelectasis present as well. No bony abnormality is seen. IMPRESSION: Bilateral pleural effusions with associated atelectatic changes. Electronically Signed   By: Inez Catalina M.D.   On: 06/10/2017 21:17   US Thoracentesis Asp Pleural Space W/img Guide  Result Date: 06/14/2017 INDICATION: Shortness of breath. Left pleural effusion seen on recent ECHO. Request is made for diagnostic and therapeutic thoracentesis. EXAM: ULTRASOUND GUIDED DIAGNOSTIC AND THERAPEUTIC THORACENTESIS MEDICATIONS: 1% xylocaine COMPLICATIONS: None immediate. PROCEDURE: An ultrasound guided thoracentesis was thoroughly discussed with the patient and questions answered. The  benefits, risks, alternatives and complications were also discussed. The patient understands and wishes to proceed with the procedure. Written consent was obtained. Ultrasound was performed to localize and mark an adequate pocket of fluid in the left chest. The area was then prepped and draped in the normal sterile fashion. 1% xylocaine was used for local anesthesia. Under ultrasound guidance a Safe-T-Centesis catheter was introduced. Thoracentesis was performed. The catheter was removed and a dressing applied. FINDINGS: A total of approximately 1 L of serosanguineous fluid was removed. Samples were sent to the laboratory as requested by the clinical team. IMPRESSION: Successful ultrasound guided left thoracentesis yielding 1 L of pleural fluid. Read by: Saverio Danker, PA-C Electronically Signed   By: Corrie Mckusick D.O.   On: 06/14/2017 12:36    Microbiology: Recent Results (from the past 240 hour(s))  Culture, blood (Routine x 2)     Status: None   Collection Time: 06/10/17  8:37 PM  Result Value Ref Range Status   Specimen Description BLOOD LEFT FOREARM  Final   Special Requests   Final    BOTTLES DRAWN AEROBIC AND ANAEROBIC Blood Culture adequate volume   Culture NO GROWTH 5 DAYS  Final   Report Status 06/15/2017 FINAL  Final  Culture, blood (Routine x 2)     Status: None   Collection Time: 06/10/17  9:00 PM  Result Value Ref Range Status   Specimen Description BLOOD LEFT ANTECUBITAL  Final   Special Requests   Final    BOTTLES DRAWN AEROBIC AND ANAEROBIC Blood Culture adequate volume   Culture NO GROWTH 5 DAYS  Final   Report Status 06/15/2017 FINAL  Final  Urine Culture     Status: None   Collection Time: 06/10/17  9:31 PM  Result Value Ref Range Status   Specimen Description URINE, RANDOM  Final   Special Requests NONE  Final   Culture NO GROWTH  Final   Report Status 06/12/2017 FINAL  Final  Culture, body fluid-bottle     Status: None (Preliminary result)   Collection Time:  06/14/17 12:25 PM  Result Value Ref Range Status   Specimen Description FLUID LEFT PLEURAL  Final   Special Requests NONE  Final   Culture NO GROWTH 4 DAYS  Final   Report Status PENDING  Incomplete  Gram stain     Status: None   Collection Time: 06/14/17 12:25 PM  Result Value Ref Range Status   Specimen Description FLUID LEFT PLEURAL  Final   Special Requests NONE  Final   Gram Stain   Final    FEW WBC PRESENT, PREDOMINANTLY MONONUCLEAR NO ORGANISMS SEEN    Report Status 06/14/2017 FINAL  Final     Labs: Basic Metabolic Panel:  Recent Labs Lab 06/13/17 0200 06/14/17 0208 06/15/17 0148 06/16/17 0828 06/17/17 0325  NA 146* 146* 148* 150* 151*  K 5.3* 5.7* 5.6* 5.0 4.9  CL  119* 119* 120* 119* 116*  CO2 16* 15* 19* 24 27  GLUCOSE 106* 97 109* 100* 87  BUN 84* 84* 84* 78* 71*  CREATININE 4.47* 4.66* 4.78* 4.66* 4.56*  CALCIUM 8.3* 8.3* 8.2* 8.2* 8.2*  MG 2.0 2.0 2.2 2.2 2.0  PHOS 6.1* 6.8* 6.9* 5.8* 5.4*   Liver Function Tests:  Recent Labs Lab 06/13/17 0200 06/14/17 0208 06/15/17 0148 06/16/17 0828 06/17/17 0325  AST 48* 36 31 51* 49*  ALT 61 54 46 44 42  ALKPHOS 84 81 78 78 76  BILITOT 0.5 0.5 0.6 0.3 0.4  PROT 6.4* 6.6 6.2* 6.1* 6.0*  ALBUMIN 3.1* 3.3* 3.2* 3.3* 3.2*   No results for input(s): LIPASE, AMYLASE in the last 168 hours. No results for input(s): AMMONIA in the last 168 hours. CBC:  Recent Labs Lab 06/13/17 0200 06/14/17 0208 06/15/17 0148 06/16/17 0828 06/17/17 0325  WBC 6.6 8.5 7.7 7.2 7.7  NEUTROABS 5.2 6.7 6.4 5.1 5.4  HGB 8.1* 8.3* 8.4* 8.2* 8.1*  HCT 24.9* 26.2* 26.2* 25.6* 25.3*  MCV 88.3 88.2 87.9 87.4 88.2  PLT 144* 171 154 149* 149*   Cardiac Enzymes: No results for input(s): CKTOTAL, CKMB, CKMBINDEX, TROPONINI in the last 168 hours. BNP: BNP (last 3 results)  Recent Labs  06/10/17 2037  BNP 361.3*    ProBNP (last 3 results) No results for input(s): PROBNP in the last 8760 hours.  CBG:  Recent Labs Lab  06/18/17 1114 06/18/17 1616 06/18/17 2021 06/19/17 0606 06/19/17 1134  GLUCAP 76 131* 91 94 92       Signed:  A Melven Sartorius MD.  [facility] 06/19/2017, 1:23 PM

## 2017-06-19 NOTE — Progress Notes (Signed)
[facility] Liaison:  RN  Received request from [place], [place], for family interest in [place] with request for transfer today.  Chart reviewed.  Spoke with Daphine, wife and [name], daughter, over the phone to confirm interest and explain services.  Family agreeable to transfer today.  Caryl Pina, CSW, is aware.  Registration and paperwork to be completed today at 1045 by Judeen Hammans, RN with [name], daughter.  Dr. Orpah Melter to assume care per family request.  Please fax discharge summary to [phone].  RN please call report to [phone].  Please arrange transport for patient to arrive as soon as possible.   Thank you for this referral.  Edyth Gunnels, RN, [facility] Liaison [phone]  All hospital liaisons are now on [place].

## 2017-06-27 DEATH — deceased

## 2018-07-16 IMAGING — MR MR HEAD W/O CM
8 of 10 series · 36 of 48 positions shown · non-contrast
Comparison: Head CT 11/23/2016

CLINICAL DATA: Suprasellar mass on CT.  Frequent falls.

EXAM:
MRI HEAD WITHOUT CONTRAST
TECHNIQUE: Multiplanar, multiecho pulse sequences of the brain and surrounding
structures were obtained without intravenous contrast.

[Series 4: DWI · axial · 3.0mm · 1.09mm/px · z∈[+1,+141]mm · 9 of 96 slices shown (1 of 4)]
[im 1/96]
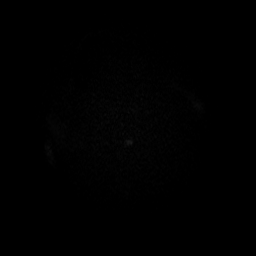
[im 12/96]
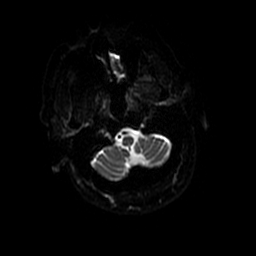
[im 24/96]
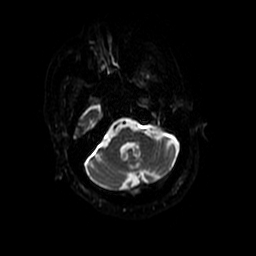
[im 36/96]
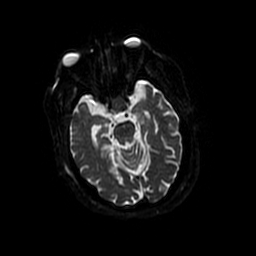
[im 48/96]
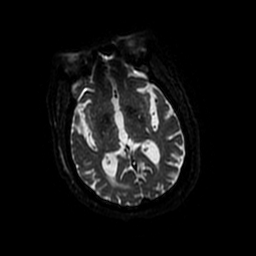
[im 60/96]
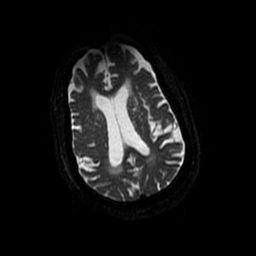
[im 72/96]
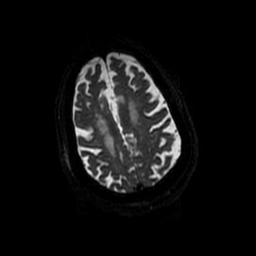
[im 84/96]
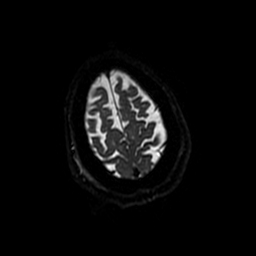
[im 96/96]
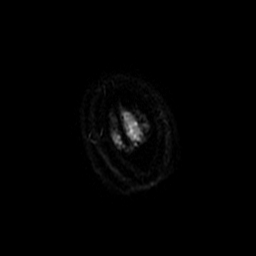

[Series 5: DWI · coronal · 5.0mm · 1.09mm/px · 7 of 72 slices shown (2 of 4)]
[im 1/72]
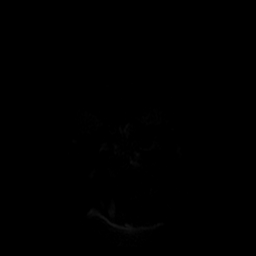
[im 12/72]
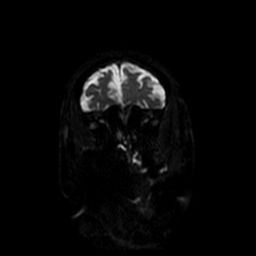
[im 24/72]
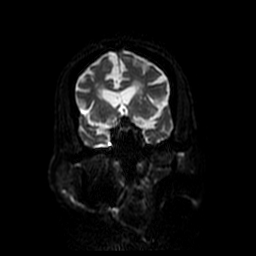
[im 36/72]
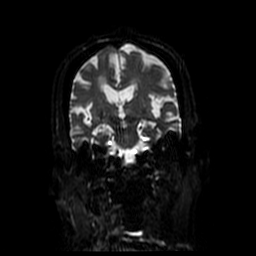
[im 48/72]
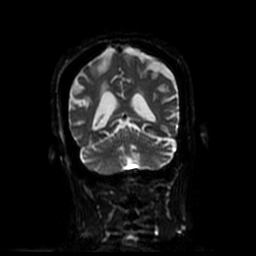
[im 60/72]
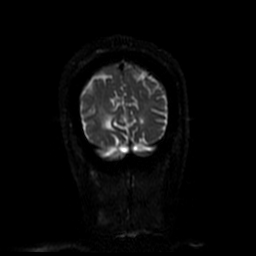
[im 72/72]
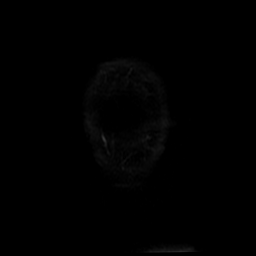

[Series 6: T2 · axial · 5.0mm · 0.43mm/px · z∈[-8,+133]mm · 3 of 25 slices shown (1 of 2)]
[im 1/25]
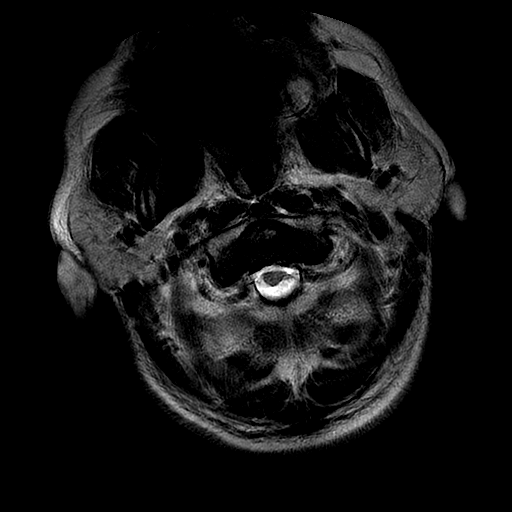
[im 13/25]
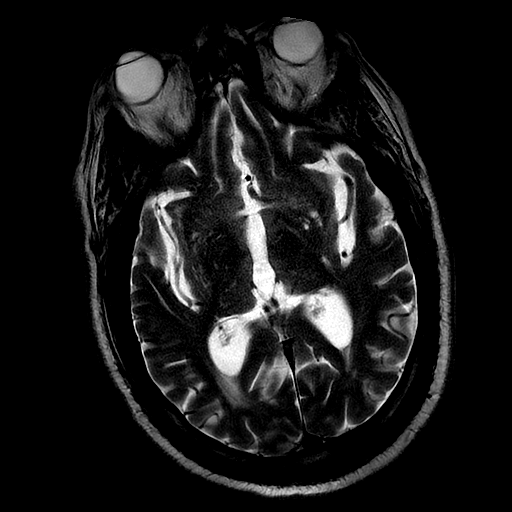
[im 25/25]
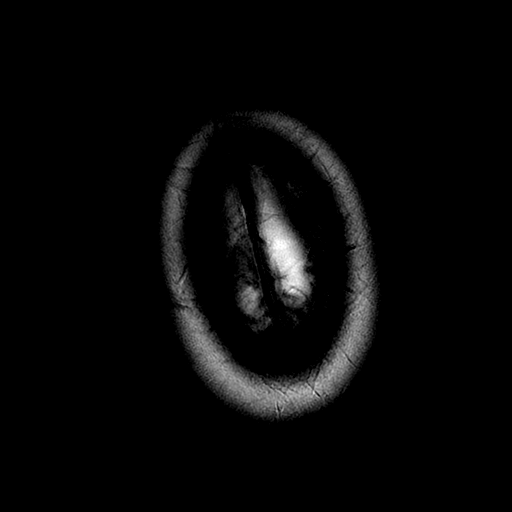

[Series 7: T2 · coronal · 5.0mm · 0.43mm/px · 3 of 30 slices shown (2 of 2)]
[im 1/30]
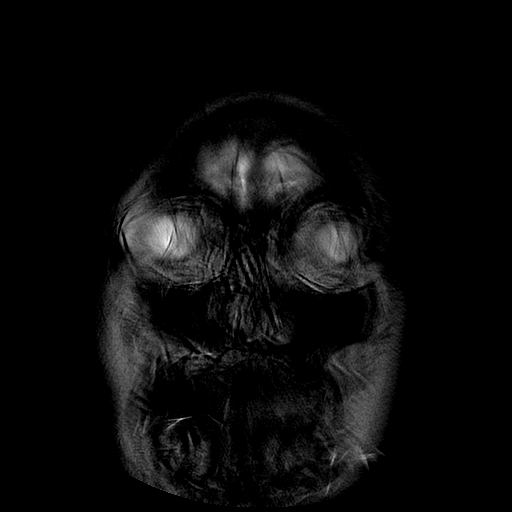
[im 15/30]
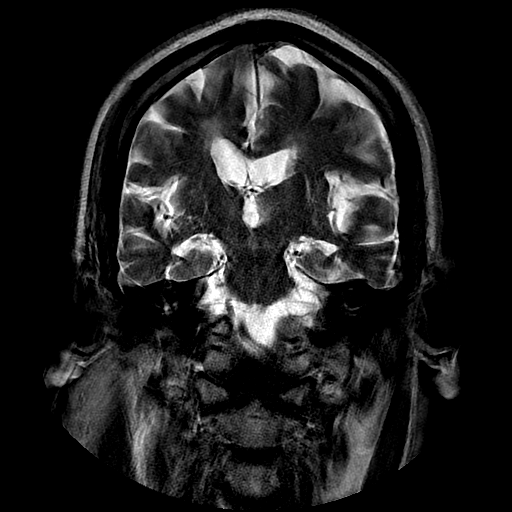
[im 30/30]
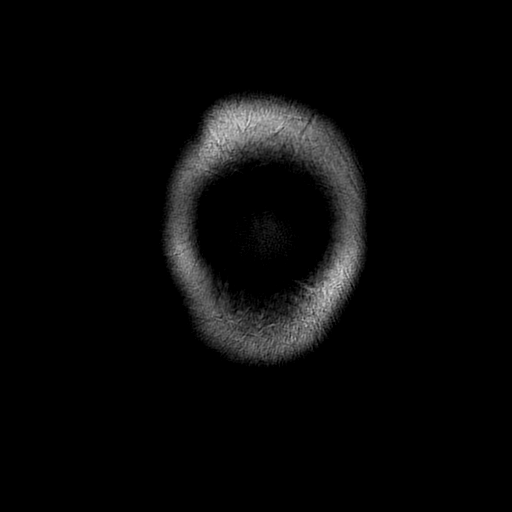

[Series 8: FLAIR · axial · 5.0mm · 0.43mm/px · z∈[-8,+133]mm · 3 of 25 slices shown]
[im 1/25]
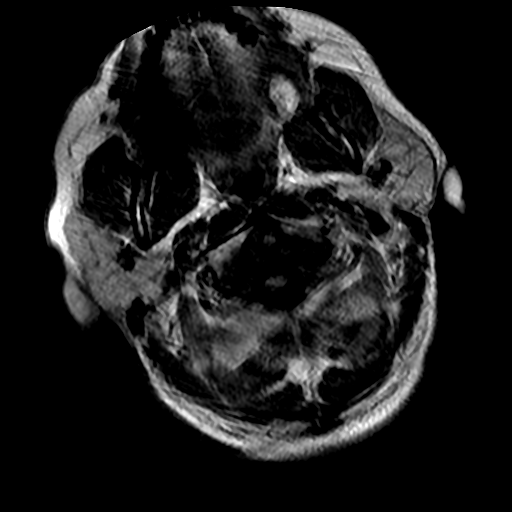
[im 13/25]
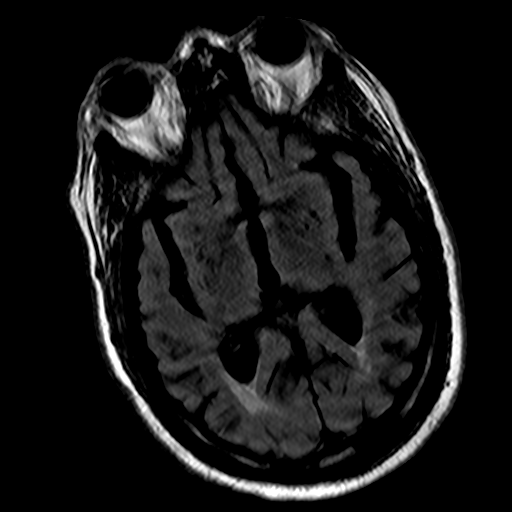
[im 25/25]
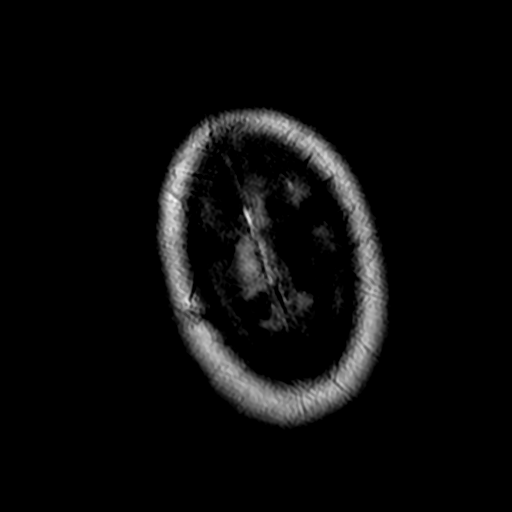

[Series 9: T1 · sagittal · 5.0mm · 0.47mm/px · 2 of 23 slices shown]
[im 1/23]
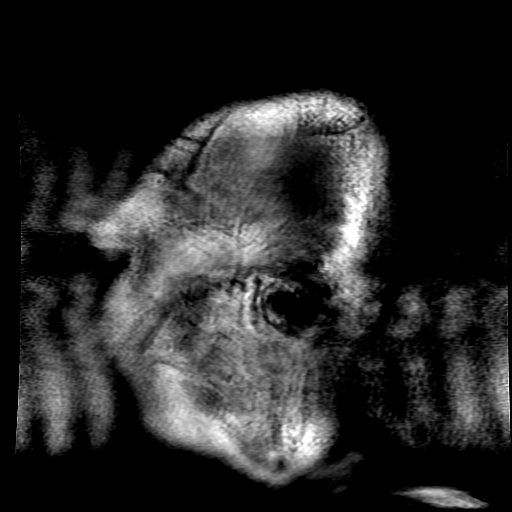
[im 23/23]
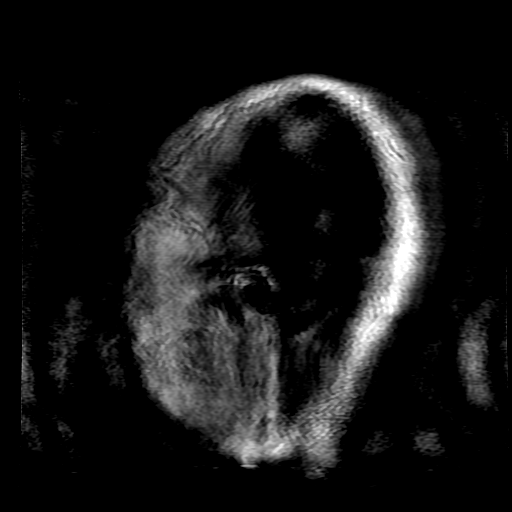

[Series 400: DWI · axial · 3.0mm · 1.09mm/px · z∈[+1,+141]mm · 5 of 48 slices shown (3 of 4)]
[im 1/48]
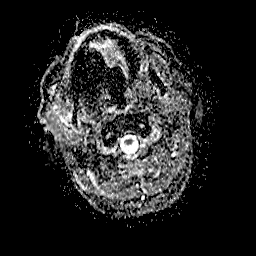
[im 12/48]
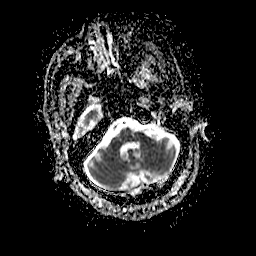
[im 24/48]
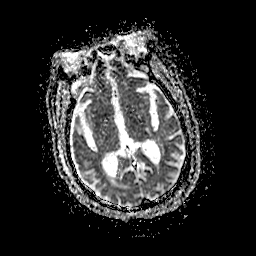
[im 36/48]
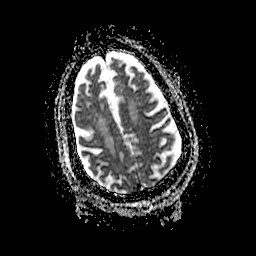
[im 48/48]
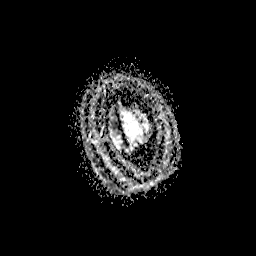

[Series 500: DWI · coronal · 5.0mm · 1.09mm/px · 4 of 36 slices shown (4 of 4)]
[im 1/36]
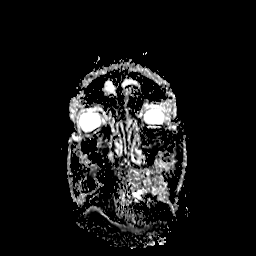
[im 12/36]
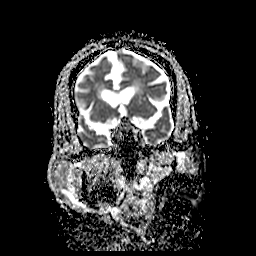
[im 24/36]
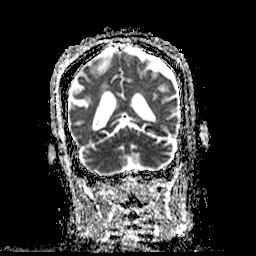
[im 36/36]
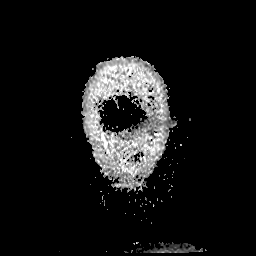

[36 of 48 positions shown; findings below may reference images not displayed]

FINDINGS: The study is severely motion degraded. IV contrast was not
administered due to renal failure.

Brain: There is no evidence of acute infarct, intracranial
hemorrhage, midline shift, or extra-axial fluid collection. There is
moderate cerebral atrophy. Periventricular white matter T2
hyperintensities are nonspecific but compatible with moderate
chronic small vessel ischemic disease. As described on recent CT,
there is a mass in the suprasellar cistern. Evaluation is extremely
limited due to motion artifact, however this suprasellar mass
appears to be contiguous with the abnormal soft tissue in the sella
which appears slightly expanded. Overall, this sellar/suprasellar
mass is estimated at 1.5 x 1.7 x 1.5 cm. Assessment for cavernous
sinus involvement is limited.

Vascular: Major intracranial vascular flow voids are preserved.

Skull and upper cervical spine: The no gross osseous lesion.

Sinuses/Orbits: Prior bilateral cataract extraction. Mild right
maxillary sinus mucosal thickening. Clear mastoid air cells.

Other: None.
IMPRESSION: 1. Severely motion degraded examination without evidence of acute
intracranial abnormality.
2. Approximately 1.7 cm sellar and suprasellar mass.
Characterization is limited by motion and lack of IV contrast.
Neoplasm such as pituitary macroadenoma is favored, with other
tumors including meningioma and craniopharyngioma possible but less
likely. A vascular etiology such as aneurysm is also considered much
less likely. Pituitary protocol brain MRI as an outpatient is
recommended when the patient's acute illness has resolved and he is
better able to follow instructions and remain motionless. IV
contrast would also be helpful at that time if his renal function
improves.
3. Moderate chronic small vessel ischemic disease and cerebral
atrophy.
